# Patient Record
Sex: Female | Born: 1970 | Race: White | Hispanic: No | Marital: Married | Smoking: Never smoker
Health system: Southern US, Community
[De-identification: ages and names within clinical notes are randomized; demographics above are authoritative.]

## PROBLEM LIST (undated history)

## (undated) DIAGNOSIS — E611 Iron deficiency: Secondary | ICD-10-CM

## (undated) DIAGNOSIS — N83209 Unspecified ovarian cyst, unspecified side: Secondary | ICD-10-CM

## (undated) DIAGNOSIS — G43909 Migraine, unspecified, not intractable, without status migrainosus: Secondary | ICD-10-CM

## (undated) DIAGNOSIS — D649 Anemia, unspecified: Secondary | ICD-10-CM

## (undated) DIAGNOSIS — E162 Hypoglycemia, unspecified: Secondary | ICD-10-CM

## (undated) DIAGNOSIS — Z803 Family history of malignant neoplasm of breast: Secondary | ICD-10-CM

## (undated) HISTORY — DX: Migraine, unspecified, not intractable, without status migrainosus: G43.909

## (undated) HISTORY — DX: Anemia, unspecified: D64.9

## (undated) HISTORY — DX: Family history of malignant neoplasm of breast: Z80.3

## (undated) HISTORY — DX: Iron deficiency: E61.1

## (undated) HISTORY — DX: Unspecified ovarian cyst, unspecified side: N83.209

## (undated) HISTORY — DX: Hypoglycemia, unspecified: E16.2

---

## 1996-01-21 HISTORY — PX: BREAST CYST ASPIRATION: SHX578

## 2004-05-22 ENCOUNTER — Ambulatory Visit: Payer: Self-pay | Admitting: Internal Medicine

## 2005-07-24 ENCOUNTER — Ambulatory Visit: Payer: Self-pay | Admitting: Psychiatry

## 2006-02-09 ENCOUNTER — Ambulatory Visit: Payer: Self-pay | Admitting: Internal Medicine

## 2006-03-23 ENCOUNTER — Ambulatory Visit: Payer: Self-pay | Admitting: Unknown Physician Specialty

## 2006-04-15 ENCOUNTER — Ambulatory Visit: Payer: Self-pay | Admitting: Internal Medicine

## 2006-09-21 ENCOUNTER — Ambulatory Visit: Payer: Self-pay | Admitting: Internal Medicine

## 2007-05-25 ENCOUNTER — Ambulatory Visit: Payer: Self-pay | Admitting: Internal Medicine

## 2007-06-08 ENCOUNTER — Ambulatory Visit: Payer: Self-pay | Admitting: Internal Medicine

## 2009-12-31 ENCOUNTER — Ambulatory Visit: Payer: Self-pay | Admitting: Internal Medicine

## 2009-12-31 LAB — HM MAMMOGRAPHY

## 2010-03-30 ENCOUNTER — Ambulatory Visit: Payer: Self-pay | Admitting: Internal Medicine

## 2010-10-05 ENCOUNTER — Ambulatory Visit: Payer: Self-pay | Admitting: Internal Medicine

## 2010-10-09 LAB — HM COLONOSCOPY

## 2011-03-18 LAB — HM PAP SMEAR

## 2011-09-11 LAB — CBC AND DIFFERENTIAL
HCT: 37 % (ref 36–46)
Hemoglobin: 12.8 g/dL (ref 12.0–16.0)
Platelets: 235 10*3/uL (ref 150–399)
WBC: 3.7 10^3/mL

## 2012-02-11 ENCOUNTER — Encounter: Payer: Self-pay | Admitting: *Deleted

## 2012-02-12 ENCOUNTER — Other Ambulatory Visit (HOSPITAL_COMMUNITY)
Admission: RE | Admit: 2012-02-12 | Discharge: 2012-02-12 | Disposition: A | Discharge status: Home / Self Care | Payer: BC Managed Care – PPO | Source: Ambulatory Visit | Attending: Internal Medicine | Admitting: Internal Medicine

## 2012-02-12 ENCOUNTER — Encounter: Payer: Self-pay | Admitting: Internal Medicine

## 2012-02-12 ENCOUNTER — Ambulatory Visit (INDEPENDENT_AMBULATORY_CARE_PROVIDER_SITE_OTHER): Payer: BC Managed Care – PPO | Admitting: Internal Medicine

## 2012-02-12 VITALS — BP 116/64 | HR 72 | Temp 98.5°F | Ht 68.0 in | Wt 135.5 lb

## 2012-02-12 DIAGNOSIS — Z1151 Encounter for screening for human papillomavirus (HPV): Secondary | ICD-10-CM | POA: Insufficient documentation

## 2012-02-12 DIAGNOSIS — D649 Anemia, unspecified: Secondary | ICD-10-CM

## 2012-02-12 DIAGNOSIS — R5383 Other fatigue: Secondary | ICD-10-CM

## 2012-02-12 DIAGNOSIS — R5381 Other malaise: Secondary | ICD-10-CM

## 2012-02-12 DIAGNOSIS — D72819 Decreased white blood cell count, unspecified: Secondary | ICD-10-CM

## 2012-02-12 DIAGNOSIS — Z01419 Encounter for gynecological examination (general) (routine) without abnormal findings: Secondary | ICD-10-CM | POA: Insufficient documentation

## 2012-02-12 DIAGNOSIS — R350 Frequency of micturition: Secondary | ICD-10-CM

## 2012-02-12 DIAGNOSIS — Z139 Encounter for screening, unspecified: Secondary | ICD-10-CM

## 2012-02-12 LAB — POCT URINALYSIS DIPSTICK
Bilirubin, UA: NEGATIVE
Blood, UA: NEGATIVE
Glucose, UA: NEGATIVE
Ketones, UA: NEGATIVE
Leukocytes, UA: NEGATIVE
Nitrite, UA: NEGATIVE
Protein, UA: NEGATIVE
Spec Grav, UA: 1.015
Urobilinogen, UA: 0.2
pH, UA: 7

## 2012-02-13 LAB — CBC WITH DIFFERENTIAL/PLATELET
Basophils Absolute: 0 10*3/uL (ref 0.0–0.1)
Basophils Relative: 0.5 % (ref 0.0–3.0)
Eosinophils Absolute: 0.1 10*3/uL (ref 0.0–0.7)
Eosinophils Relative: 1.6 % (ref 0.0–5.0)
HCT: 36.6 % (ref 36.0–46.0)
Hemoglobin: 12.4 g/dL (ref 12.0–15.0)
Lymphocytes Relative: 22.4 % (ref 12.0–46.0)
Lymphs Abs: 0.9 10*3/uL (ref 0.7–4.0)
MCHC: 34 g/dL (ref 30.0–36.0)
MCV: 89 fl (ref 78.0–100.0)
Monocytes Absolute: 0.4 10*3/uL (ref 0.1–1.0)
Monocytes Relative: 9.9 % (ref 3.0–12.0)
Neutro Abs: 2.8 10*3/uL (ref 1.4–7.7)
Neutrophils Relative %: 65.6 % (ref 43.0–77.0)
Platelets: 220 10*3/uL (ref 150.0–400.0)
RBC: 4.11 Mil/uL (ref 3.87–5.11)
RDW: 13.6 % (ref 11.5–14.6)
WBC: 4.2 10*3/uL — ABNORMAL LOW (ref 4.5–10.5)

## 2012-02-13 LAB — COMPREHENSIVE METABOLIC PANEL
ALT: 21 U/L (ref 0–35)
AST: 24 U/L (ref 0–37)
Albumin: 4.1 g/dL (ref 3.5–5.2)
Alkaline Phosphatase: 49 U/L (ref 39–117)
BUN: 15 mg/dL (ref 6–23)
CO2: 28 mEq/L (ref 19–32)
Calcium: 9 mg/dL (ref 8.4–10.5)
Chloride: 101 mEq/L (ref 96–112)
Creatinine, Ser: 0.7 mg/dL (ref 0.4–1.2)
GFR: 92.74 mL/min (ref >60.00)
Glucose, Bld: 95 mg/dL (ref 70–99)
Potassium: 4.6 mEq/L (ref 3.5–5.1)
Sodium: 136 mEq/L (ref 135–145)
Total Bilirubin: 0.3 mg/dL (ref 0.3–1.2)
Total Protein: 7.1 g/dL (ref 6.0–8.3)

## 2012-02-13 LAB — TSH: TSH: 0.87 u[IU]/mL (ref 0.35–5.50)

## 2012-02-13 LAB — FERRITIN: Ferritin: 12.9 ng/mL (ref 10.0–291.0)

## 2012-02-15 ENCOUNTER — Encounter: Payer: Self-pay | Admitting: Internal Medicine

## 2012-02-15 DIAGNOSIS — D649 Anemia, unspecified: Secondary | ICD-10-CM | POA: Insufficient documentation

## 2012-02-15 DIAGNOSIS — D72819 Decreased white blood cell count, unspecified: Secondary | ICD-10-CM | POA: Insufficient documentation

## 2012-02-15 LAB — URINE CULTURE: Colony Count: 5000

## 2012-02-15 NOTE — Progress Notes (Signed)
Subjective:    Patient ID: Adrienne Phillips, female    DOB: [birth year], 42 y.o.   MRN: [id number]  HPI 42 year old with past history of migraine headaches, anemia and iron deficiency who comes in today to follow up on these issues as well as for a complete physical exam.  States she is doing well.  Handling stress relatively well.  Her and her husband are working through some issues.  He now has a job.  Has a lot of stress at work.  Had some issues with her left hip.  Had massage therapy.  Yoga and stretches help.  Felt a pop and her hip has not bothered her since.  Planning to continue yoga.  LMP 01/29/12.  Had some issues with a rectal tear.  Has been using some NTG cream.  Now taking a stool softener.  Better.  Has a history of seizures.  Has not had a seizure in years.   States she was at work and felt a funny sensation in her head.  She states it lasted approximately 10 seconds and resolve.  No confusion.  No post ictal state.  Has not had any further issues.  She could feel this episode coming on.  Exercising.  No cardiac symptoms with increased activity or exertion.  Eating and drinking well.  Past Medical History  Diagnosis Date  . Anemia   . Migraine   . Hypoglycemia   . Ovarian cyst   . Iron deficiency      Current Outpatient Prescriptions on File Prior to Visit  Medication Sig Dispense Refill  . Cholecalciferol (VITAMIN D3) 1000 UNITS CAPS Take by mouth daily. 5 capsules daily      . lansoprazole (PREVACID) 15 MG capsule Take 15 mg by mouth daily.      . metaxalone (SKELAXIN) 800 MG tablet Take 800 mg by mouth at bedtime. Take 1/2 to 1 tab at bedtime        Review of Systems Patient denies any headache, lightheadedness or dizziness.  No sinus or allergy symptoms. No chest pain, tightness or palpitations.  No increased shortness of breath, cough or congestion.  No nausea or vomiting.  No acid reflux.  No abdominal pain or cramping.  No bowel change, such as diarrhea,  BRBPR or melana.  Had  the tear as outlined.  Using a stool softener now.  No urine change.  Hip better.      Objective:   Physical Exam Filed Vitals:   02/12/12 1435  BP: 116/64  Pulse: 72  Temp: 98.5 F (72.70 C)   42 year old female in no acute distress.   HEENT:  Nares- clear.  Oropharynx - without lesions. NECK:  Supple.  Nontender.  No audible bruit.  HEART:  Appears to be regular. LUNGS:  No crackles or wheezing audible.  Respirations even and unlabored.  RADIAL PULSE:  Equal bilaterally.    BREASTS:  No nipple discharge or nipple retraction present.  Could not appreciate any distinct nodules or axillary adenopathy.  ABDOMEN:  Soft, nontender.  Bowel sounds present and normal.  No audible abdominal bruit.  GU:  Normal external genitalia.  Vaginal vault without lesions.  Cervix identified.  Pap performed. Could not appreciate any adnexal masses or tenderness.   EXTREMITIES:  No increased edema present.  DP pulses palpable and equal bilaterally.           Assessment & Plan:  HISTORY OF SEIZURE DISORDER.  See above.  Episode lasted 10 seconds.  No post ictal state or confusion.  Question of etiology.  Talked with her regarding further w/up.  She declines.  Will monitor closely.  States she can always tell when coming on.  Follow.  Will require further w/up if reoccurs.   RECTAL TEAR.  Improved with NTG cream.  Follow.    INCREASED PSYCHOSOCIAL STRESSORS.  Feels she is handling things relatively well.  Does not feel she needs any further intervention at this point.  Follow.  Planning to start yoga.    GYN.  Pelvic and pap today.    HISTORY OF HEADACHES.  Not an issue for her now.  Had a relatively recent MRI - negative.  Follow.   CARDIOVASCULAR.  Asymptomatic.    HEALTH MAINTENANCE.  Physical today.  Colonoscopy 09/29/10 - two polyps.  Recommended follow up 2017.  Schedule mammogram.

## 2012-02-15 NOTE — Assessment & Plan Note (Signed)
Worked up by hematology.  Recheck cbc.  

## 2012-02-15 NOTE — Assessment & Plan Note (Signed)
GI w/up as outlined.  Recheck cbc and ferritin.

## 2012-02-21 ENCOUNTER — Telehealth: Payer: Self-pay | Admitting: Internal Medicine

## 2012-02-21 NOTE — Telephone Encounter (Signed)
Pt notified of lab results via mychart. 

## 2012-02-25 ENCOUNTER — Telehealth: Payer: Self-pay | Admitting: Internal Medicine

## 2012-02-25 NOTE — Telephone Encounter (Signed)
Pt is stating mammo should be diagnostic instead of screening.  What do I need to do, just change the order.  I am not sure why is diagnostic.

## 2012-02-25 NOTE — Telephone Encounter (Signed)
Pt has mammo coming up but it should be diagnostic instead of Screening.

## 2012-04-02 ENCOUNTER — Encounter: Payer: Self-pay | Admitting: Internal Medicine

## 2012-04-02 DIAGNOSIS — K2270 Barrett's esophagus without dysplasia: Secondary | ICD-10-CM

## 2012-04-14 ENCOUNTER — Telehealth: Payer: Self-pay | Admitting: Internal Medicine

## 2012-04-14 NOTE — Telephone Encounter (Signed)
Patient agreed to f/u with Dr. Lorin Picket. Appointment scheduled.

## 2012-04-14 NOTE — Telephone Encounter (Signed)
Notify pt that she would need to go through physical therapy for a brace like this - to be fitted.  If increasing problems, may need to see her again and reevaluate.  If agreeable, can schedule her a follow up appt with me

## 2012-04-14 NOTE — Telephone Encounter (Signed)
Pt is wanting to know if you would write an rx for a neck brace/harness. She is having a lot of trouble with her neck and is needing something to help stretch her neck. She described it as what "Barney wore in the [employer] show" (Pt told me to put that she said you would know exactly what it was.

## 2012-04-29 ENCOUNTER — Encounter: Payer: Self-pay | Admitting: Internal Medicine

## 2012-05-19 ENCOUNTER — Ambulatory Visit: Payer: BC Managed Care – PPO | Admitting: Internal Medicine

## 2012-07-22 ENCOUNTER — Other Ambulatory Visit: Payer: Self-pay | Admitting: Internal Medicine

## 2012-07-22 NOTE — Telephone Encounter (Signed)
Prescription sent for skelaxin 800mg  #20  With no refills.

## 2012-08-12 ENCOUNTER — Encounter: Payer: Self-pay | Admitting: Internal Medicine

## 2012-08-12 ENCOUNTER — Ambulatory Visit (INDEPENDENT_AMBULATORY_CARE_PROVIDER_SITE_OTHER): Payer: BC Managed Care – PPO | Admitting: Internal Medicine

## 2012-08-12 VITALS — BP 110/70 | HR 60 | Temp 98.7°F | Ht 68.0 in | Wt 138.2 lb

## 2012-08-12 DIAGNOSIS — D72819 Decreased white blood cell count, unspecified: Secondary | ICD-10-CM

## 2012-08-12 DIAGNOSIS — D649 Anemia, unspecified: Secondary | ICD-10-CM

## 2012-08-15 ENCOUNTER — Encounter: Payer: Self-pay | Admitting: Internal Medicine

## 2012-08-15 NOTE — Progress Notes (Signed)
Subjective:    Patient ID: Adrienne Phillips, female    DOB: 06/09/1970, 42 y.o.   MRN: [id number]  HPI 42 year old with past history of migraine headaches, anemia and iron deficiency who comes in today for a scheduled follow up.  States she is doing relatively well.  Handling stress relatively well.  Her and her husband have tried working through some issues.  He still has a job.  We discussed counseling.  May be agreeable to this in the future.  Is going out.  Still exercising.  Has a lot of stress at work.  Yoga and stretches help.  Exercising.  No cardiac symptoms with increased activity or exertion.  Eating and drinking well.  Still having issues with her left hip and leg.  Left leg will feel numb when she is working out.  Resolves after she stops exercising.  She did see [facility].  Wearing support stockings.  Helping some.  Planning to have vein intervention in 9/14.     Past Medical History  Diagnosis Date  . Anemia   . Migraine   . Hypoglycemia   . Ovarian cyst   . Iron deficiency      Current Outpatient Prescriptions on File Prior to Visit  Medication Sig Dispense Refill  . Lactobacillus (ACIDOPHILUS PO) Take by mouth daily.      . metaxalone (SKELAXIN) 800 MG tablet TAKE 1/2 TO 1 TABLET BY MOUTH AT BEDTIME AS NEEDED FOR BACK SPASMS  20 tablet  0   No current facility-administered medications on file prior to visit.    Review of Systems Patient denies any headache, lightheadedness or dizziness.  No sinus or allergy symptoms. No chest pain, tightness or palpitations.  No increased shortness of breath, cough or congestion.  No nausea or vomiting.  No acid reflux.  No abdominal pain or cramping.  No bowel change, such as diarrhea,  BRBPR or melana.  Left hip and leg discomfort as outlined.       Objective:   Physical Exam  Filed Vitals:   08/12/12 1553  BP: 110/70  Pulse: 60  Temp: 98.7 F (1.41 C)   42 year old female in no acute distress.   HEENT:  Nares-  clear.  Oropharynx - without lesions. NECK:  Supple.  Nontender.  No audible bruit.  HEART:  Appears to be regular. LUNGS:  No crackles or wheezing audible.  Respirations even and unlabored.  RADIAL PULSE:  Equal bilaterally.  ABDOMEN:  Soft, nontender.  Bowel sounds present and normal.  No audible abdominal bruit.   EXTREMITIES:  No increased edema present.  DP pulses palpable and equal bilaterally.      MSK:  Some pulling in her left hip with straight leg raise.  No motor weakness noted.       Assessment & Plan:  INCREASED PSYCHOSOCIAL STRESSORS.  Feels she is handling things relatively well.  Discussed counseling.  May be agreeable in the future.    MSK.  Left hip and leg pain and numbness as outlined.  Stretches.  Planning for vein intervention in 9/14.  Wants to hold on further w/up at this time.  Skelaxin prn.  Follow.   HISTORY OF HEADACHES.  Not an issue for her now.  Had a relatively recent MRI - negative.  Follow.   CARDIOVASCULAR.  Asymptomatic.    HEALTH MAINTENANCE.  Physical last visit.  Pap negative with negative HPV.  Colonoscopy 09/29/10 - two polyps.  Recommended follow up 2017.  Mammogram 04/01/12 - Birads II.

## 2012-08-15 NOTE — Assessment & Plan Note (Signed)
Hemoglobin checked in 1/14 - normal.  Follow.

## 2012-08-15 NOTE — Assessment & Plan Note (Signed)
Worked up by hematology.  Last wbc - stable.    

## 2012-09-01 ENCOUNTER — Telehealth: Payer: Self-pay | Admitting: Internal Medicine

## 2012-09-01 NOTE — Telephone Encounter (Signed)
Counselor information given.

## 2012-09-03 ENCOUNTER — Encounter: Payer: Self-pay | Admitting: Internal Medicine

## 2012-10-05 ENCOUNTER — Encounter: Payer: Self-pay | Admitting: Internal Medicine

## 2012-10-05 ENCOUNTER — Ambulatory Visit (INDEPENDENT_AMBULATORY_CARE_PROVIDER_SITE_OTHER): Payer: BC Managed Care – PPO | Admitting: Internal Medicine

## 2012-10-05 VITALS — BP 110/70 | HR 63 | Temp 98.0°F | Ht 68.0 in | Wt 136.5 lb

## 2012-10-05 DIAGNOSIS — R109 Unspecified abdominal pain: Secondary | ICD-10-CM

## 2012-10-05 DIAGNOSIS — D649 Anemia, unspecified: Secondary | ICD-10-CM

## 2012-10-05 LAB — CBC WITH DIFFERENTIAL/PLATELET
Basophils Absolute: 0.1 10*3/uL (ref 0.0–0.1)
Basophils Relative: 1.1 % (ref 0.0–3.0)
Eosinophils Absolute: 0.1 10*3/uL (ref 0.0–0.7)
Eosinophils Relative: 1.2 % (ref 0.0–5.0)
HCT: 36 % (ref 36.0–46.0)
Hemoglobin: 11.9 g/dL — ABNORMAL LOW (ref 12.0–15.0)
Lymphocytes Relative: 23.5 % (ref 12.0–46.0)
Lymphs Abs: 1.1 10*3/uL (ref 0.7–4.0)
MCHC: 33.2 g/dL (ref 30.0–36.0)
MCV: 88.6 fl (ref 78.0–100.0)
Monocytes Absolute: 0.5 10*3/uL (ref 0.1–1.0)
Monocytes Relative: 9.8 % (ref 3.0–12.0)
Neutro Abs: 3 10*3/uL (ref 1.4–7.7)
Neutrophils Relative %: 64.4 % (ref 43.0–77.0)
Platelets: 221 10*3/uL (ref 150.0–400.0)
RBC: 4.07 Mil/uL (ref 3.87–5.11)
RDW: 14.7 % — ABNORMAL HIGH (ref 11.5–14.6)
WBC: 4.7 10*3/uL (ref 4.5–10.5)

## 2012-10-05 LAB — HEPATIC FUNCTION PANEL
ALT: 22 U/L (ref 0–35)
AST: 20 U/L (ref 0–37)
Albumin: 4.1 g/dL (ref 3.5–5.2)
Alkaline Phosphatase: 52 U/L (ref 39–117)
Bilirubin, Direct: 0 mg/dL (ref 0.0–0.3)
Total Bilirubin: 0.4 mg/dL (ref 0.3–1.2)
Total Protein: 6.8 g/dL (ref 6.0–8.3)

## 2012-10-05 LAB — BASIC METABOLIC PANEL
BUN: 11 mg/dL (ref 6–23)
CO2: 28 mEq/L (ref 19–32)
Calcium: 8.8 mg/dL (ref 8.4–10.5)
Chloride: 104 mEq/L (ref 96–112)
Creatinine, Ser: 0.7 mg/dL (ref 0.4–1.2)
GFR: 102.07 mL/min (ref >60.00)
Glucose, Bld: 81 mg/dL (ref 70–99)
Potassium: 4.6 mEq/L (ref 3.5–5.1)
Sodium: 137 mEq/L (ref 135–145)

## 2012-10-05 LAB — LIPASE: Lipase: 19 U/L (ref 11.0–59.0)

## 2012-10-05 LAB — AMYLASE: Amylase: 37 U/L (ref 27–131)

## 2012-10-06 ENCOUNTER — Encounter: Payer: Self-pay | Admitting: Internal Medicine

## 2012-10-06 ENCOUNTER — Ambulatory Visit: Payer: Self-pay | Admitting: Internal Medicine

## 2012-10-07 ENCOUNTER — Telehealth: Payer: Self-pay | Admitting: Internal Medicine

## 2012-10-07 ENCOUNTER — Encounter: Payer: Self-pay | Admitting: Internal Medicine

## 2012-10-07 NOTE — Telephone Encounter (Signed)
Opened in error

## 2012-10-07 NOTE — Progress Notes (Signed)
Subjective:    Patient ID: Adrienne Phillips, female    DOB: [birth year], 42 y.o.   MRN: [id number]  Abdominal Pain Associated symptoms include diarrhea.  Diarrhea  Associated symptoms include abdominal pain.  42 year old with past history of migraine headaches, anemia and iron deficiency who comes in today as a work in with concerns regarding increased epigastric pain.  States over the last week she has noticed persistent pain and decreased po intake.  Symptoms started one week ago.  Went to eat at a friends house.  Starting feeling a burning sensation in her stomach ("on fire").  Felt shaky in her stomach.  Emesis x 4 initially.  Through that night, had increased bilious vomiting.  States was vomiting every 15 minutes.  Took a phenergan suppository and vomiting stopped.  No further vomiting. Is having throat and epigastric pain.  Unable to eat.  States every time she eats, burning/discomfort.  Also watery stool, immediately after eating.  No blood.  No documented fever.    Past Medical History  Diagnosis Date  . Anemia   . Migraine   . Hypoglycemia   . Ovarian cyst   . Iron deficiency      Current Outpatient Prescriptions on File Prior to Visit  Medication Sig Dispense Refill  . ALPRAZolam (XANAX) 0.25 MG tablet Take 0.25 mg by mouth at bedtime as needed for sleep.      Tery Sanfilippo Sodium (COLACE PO) Take by mouth.      . famotidine (PEPCID) 10 MG tablet Take 10 mg by mouth daily.      . Lactobacillus (ACIDOPHILUS PO) Take by mouth daily.      . metaxalone (SKELAXIN) 800 MG tablet TAKE 1/2 TO 1 TABLET BY MOUTH AT BEDTIME AS NEEDED FOR BACK SPASMS  20 tablet  0  . Triamcinolone Acetonide (NASACORT AQ NA) Place into the nose.       No current facility-administered medications on file prior to visit.    Review of Systems  Gastrointestinal: Positive for abdominal pain and diarrhea.  Patient denies any headache, lightheadedness or dizziness.  No sinus or allergy symptoms. No chest pain,  tightness or palpitations.  No increased shortness of breath, cough or congestion.  Epigastric pain and discomfort as outlined.  Discomfort after eating.  Loose stool after eating as outlined.  No blood.        Objective:   Physical Exam  Filed Vitals:   10/05/12 1352  BP: 110/70  Pulse: 63  Temp: 98 F (67.19 C)   42 year old female in no acute distress.  NECK:  Supple.  Nontender.  HEART:  Appears to be regular. LUNGS:  No crackles or wheezing audible.  Respirations even and unlabored.  RADIAL PULSE:  Equal bilaterally.  ABDOMEN:  Soft.  Minimal tenderness to palpation over the epigastric region and upper quadrant.  Bowel sounds present and normal.  No audible abdominal bruit.   EXTREMITIES:  No increased edema present.      Assessment & Plan:  ABDOMINAL PAIN.  Persistent.  Symptoms and exam as outlined.  Persistent loose stool after eating. She is unable to take PPIs.  Will start her pepcid as directed.  Maalox as directed.  Bland foods.  Stay hydrated.  Check liver panel, amylase, lipase and cbc.  Check abdominal ultrasound.  Further w/up pending results.  Pt comfortable with this plan.    INCREASED PSYCHOSOCIAL STRESSORS.  Feels she is handling things relatively well.    CARDIOVASCULAR.  Asymptomatic.  HEALTH MAINTENANCE.  Physical 02/12/12.   Pap negative with negative HPV.  Colonoscopy 09/29/10 - two polyps.  Recommended follow up 2017.  Mammogram 04/01/12 - Birads II.   I spent 25 minutes with the patient and more than 50% of the time was spent in consultation regarding the above.

## 2012-10-08 ENCOUNTER — Encounter: Payer: Self-pay | Admitting: Internal Medicine

## 2012-10-08 NOTE — Telephone Encounter (Signed)
Called pt 10/07/12.  Discussed ultrasound results with her.  Discussed further w/up including HIDA scan and referral to surgery.  She also informed my that she was due f/u with Dr Markham Jordan for repeat EGD.  She has completed her form and is waiting for them to schedule.  She plans to call them 10/08/12 to follow up regarding scheduling.  She is feeling better.  Will continue her current regimen.  Leaving to go to [place] in two days.  Will call me back when she returns.

## 2012-10-08 NOTE — Assessment & Plan Note (Signed)
Hemoglobin checked in 1/14 - normal.  Follow.  

## 2012-10-21 ENCOUNTER — Encounter: Payer: Self-pay | Admitting: Internal Medicine

## 2012-11-25 ENCOUNTER — Other Ambulatory Visit: Payer: Self-pay

## 2013-01-12 ENCOUNTER — Ambulatory Visit: Payer: Self-pay | Admitting: Physician Assistant

## 2013-01-12 LAB — RAPID INFLUENZA A&B ANTIGENS

## 2013-02-18 ENCOUNTER — Encounter: Payer: Self-pay | Admitting: Internal Medicine

## 2013-02-18 ENCOUNTER — Telehealth: Payer: Self-pay | Admitting: Internal Medicine

## 2013-02-18 ENCOUNTER — Ambulatory Visit (INDEPENDENT_AMBULATORY_CARE_PROVIDER_SITE_OTHER): Payer: BC Managed Care – PPO | Admitting: Internal Medicine

## 2013-02-18 VITALS — BP 110/70 | HR 80 | Temp 98.4°F | Ht 67.0 in | Wt 140.2 lb

## 2013-02-18 DIAGNOSIS — D649 Anemia, unspecified: Secondary | ICD-10-CM

## 2013-02-18 DIAGNOSIS — Z1239 Encounter for other screening for malignant neoplasm of breast: Secondary | ICD-10-CM

## 2013-02-18 DIAGNOSIS — D72819 Decreased white blood cell count, unspecified: Secondary | ICD-10-CM

## 2013-02-18 DIAGNOSIS — Z13220 Encounter for screening for lipoid disorders: Secondary | ICD-10-CM

## 2013-02-18 NOTE — Progress Notes (Signed)
Pre-visit discussion using our clinic review tool. No additional management support is needed unless otherwise documented below in the visit note.  

## 2013-02-18 NOTE — Telephone Encounter (Signed)
At check out pt states she is having her labs done in [place].  States she is supposed to get a script for those.  States she will swing back by shortly to pick that up.

## 2013-02-18 NOTE — Telephone Encounter (Signed)
See below

## 2013-02-18 NOTE — Telephone Encounter (Signed)
rx written and placed up front.

## 2013-02-20 ENCOUNTER — Encounter: Payer: Self-pay | Admitting: Internal Medicine

## 2013-02-20 NOTE — Assessment & Plan Note (Addendum)
Hemoglobin checked in 10/05/12 - 11.9.   Follow.  Recheck cbc.

## 2013-02-20 NOTE — Progress Notes (Signed)
Subjective:    Patient ID: Adrienne GreenlandLisa Purifoy, female    DOB: [birth year], 43 y.o.   MRN: [id number]  HPI 43 year old with past history of migraine headaches, anemia and iron deficiency who comes in today to follow up on these issues as well as for a complete physical exam.   States she is doing relatively well.  Handling stress well.  Her and her husband has worked through some of their issues.  Back together.  Still exercising.  Has a lot of stress at work.  Yoga and stretches help.  Exercising.  No cardiac symptoms with increased activity or exertion.  Eating and drinking well.   She did see [facility].  Wearing support stockings.  Had vascular intervention.  Doing well.  Has f/u regarding her left leg next week.  The aching has improved.  Neck/shoulder/arm - better.  Takes 1/2 skelaxin as needed.  Overall feels good.      Past Medical History  Diagnosis Date  . Anemia   . Migraine   . Hypoglycemia   . Ovarian cyst   . Iron deficiency      Current Outpatient Prescriptions on File Prior to Visit  Medication Sig Dispense Refill  . ALPRAZolam (XANAX) 0.25 MG tablet Take 0.25 mg by mouth at bedtime as needed for sleep.      Tery Sanfilippo. Docusate Sodium (COLACE PO) Take by mouth.      . famotidine (PEPCID) 10 MG tablet Take 10 mg by mouth daily.      . Lactobacillus (ACIDOPHILUS PO) Take by mouth daily.      . metaxalone (SKELAXIN) 800 MG tablet TAKE 1/2 TO 1 TABLET BY MOUTH AT BEDTIME AS NEEDED FOR BACK SPASMS  20 tablet  0   No current facility-administered medications on file prior to visit.    Review of Systems Patient denies any headache, lightheadedness or dizziness.  No sinus or allergy symptoms.  No chest pain, tightness or palpitations.  No increased shortness of breath, cough or congestion.  No nausea or vomiting.  No acid reflux.  No abdominal pain or cramping.  No bowel change, such as diarrhea,  BRBPR or melana.  Is s/p vascular intervention on her legs.  Legs are better.  Aching  better.  Handling stress well.       Objective:   Physical Exam  Filed Vitals:   02/18/13 1048  BP: 110/70  Pulse: 80  Temp: 98.4 F (5636.519 C)   43 year old female in no acute distress.   HEENT:  Nares- clear.  Oropharynx - without lesions. NECK:  Supple.  Nontender.  No audible bruit.  HEART:  Appears to be regular. LUNGS:  No crackles or wheezing audible.  Respirations even and unlabored.  RADIAL PULSE:  Equal bilaterally.    BREASTS:  No nipple discharge or nipple retraction present.  Could not appreciate any distinct nodules or axillary adenopathy.  ABDOMEN:  Soft, nontender.  Bowel sounds present and normal.  No audible abdominal bruit.  GU:  Not performed.    EXTREMITIES:  No increased edema present.  DP pulses palpable and equal bilaterally.          Assessment & Plan:  INCREASED PSYCHOSOCIAL STRESSORS.  Feels she is handling things relatively well.  Relationship better.  Follow.   MSK.  Takes skelaxin prn.  Stable.    HISTORY OF HEADACHES.  Not an issue for her now.  Had a relatively recent MRI - negative.  Follow.  CARDIOVASCULAR.  Asymptomatic.    HEALTH MAINTENANCE.  Physical today.   Pap negative with negative HPV.  Colonoscopy 09/29/10 - two polyps.  Recommended follow up 2017.  Mammogram 04/01/12 - Birads II.  Schedule f/u mammogram when due.

## 2013-02-20 NOTE — Assessment & Plan Note (Signed)
Worked up by hematology.  Last wbc - stable.

## 2013-02-24 ENCOUNTER — Encounter: Payer: Self-pay | Admitting: Emergency Medicine

## 2013-03-20 ENCOUNTER — Encounter: Payer: Self-pay | Admitting: Internal Medicine

## 2013-03-20 DIAGNOSIS — K2270 Barrett's esophagus without dysplasia: Secondary | ICD-10-CM

## 2013-03-31 ENCOUNTER — Ambulatory Visit: Payer: Self-pay | Admitting: Internal Medicine

## 2013-03-31 LAB — HM MAMMOGRAPHY: HM Mammogram: NEGATIVE

## 2013-04-05 ENCOUNTER — Encounter: Payer: Self-pay | Admitting: Internal Medicine

## 2013-04-28 ENCOUNTER — Encounter: Payer: Self-pay | Admitting: Internal Medicine

## 2013-05-03 ENCOUNTER — Encounter: Payer: Self-pay | Admitting: Internal Medicine

## 2013-05-20 ENCOUNTER — Other Ambulatory Visit (INDEPENDENT_AMBULATORY_CARE_PROVIDER_SITE_OTHER): Payer: BC Managed Care – PPO

## 2013-05-20 DIAGNOSIS — D649 Anemia, unspecified: Secondary | ICD-10-CM

## 2013-05-20 DIAGNOSIS — D72819 Decreased white blood cell count, unspecified: Secondary | ICD-10-CM

## 2013-05-20 DIAGNOSIS — Z13220 Encounter for screening for lipoid disorders: Secondary | ICD-10-CM

## 2013-05-20 LAB — CBC WITH DIFFERENTIAL/PLATELET
Basophils Absolute: 0 10*3/uL (ref 0.0–0.1)
Basophils Relative: 1.4 % (ref 0.0–3.0)
Eosinophils Absolute: 0.1 10*3/uL (ref 0.0–0.7)
Eosinophils Relative: 2 % (ref 0.0–5.0)
HCT: 34.5 % — ABNORMAL LOW (ref 36.0–46.0)
Hemoglobin: 11.6 g/dL — ABNORMAL LOW (ref 12.0–15.0)
Lymphocytes Relative: 27.6 % (ref 12.0–46.0)
Lymphs Abs: 0.7 10*3/uL (ref 0.7–4.0)
MCHC: 33.6 g/dL (ref 30.0–36.0)
MCV: 88.5 fl (ref 78.0–100.0)
Monocytes Absolute: 0.4 10*3/uL (ref 0.1–1.0)
Monocytes Relative: 13.7 % — ABNORMAL HIGH (ref 3.0–12.0)
Neutro Abs: 1.4 10*3/uL (ref 1.4–7.7)
Neutrophils Relative %: 55.3 % (ref 43.0–77.0)
Platelets: 200 10*3/uL (ref 150.0–400.0)
RBC: 3.89 Mil/uL (ref 3.87–5.11)
RDW: 14.1 % (ref 11.5–14.6)
WBC: 2.6 10*3/uL — ABNORMAL LOW (ref 4.5–10.5)

## 2013-05-20 LAB — COMPREHENSIVE METABOLIC PANEL
ALT: 20 U/L (ref 0–35)
AST: 21 U/L (ref 0–37)
Albumin: 3.8 g/dL (ref 3.5–5.2)
Alkaline Phosphatase: 43 U/L (ref 39–117)
BUN: 11 mg/dL (ref 6–23)
CO2: 27 mEq/L (ref 19–32)
Calcium: 8.8 mg/dL (ref 8.4–10.5)
Chloride: 102 mEq/L (ref 96–112)
Creatinine, Ser: 0.7 mg/dL (ref 0.4–1.2)
GFR: 93.66 mL/min (ref >60.00)
Glucose, Bld: 84 mg/dL (ref 70–99)
Potassium: 4.5 mEq/L (ref 3.5–5.1)
Sodium: 135 mEq/L (ref 135–145)
Total Bilirubin: 0.5 mg/dL (ref 0.3–1.2)
Total Protein: 6.6 g/dL (ref 6.0–8.3)

## 2013-05-20 LAB — LIPID PANEL
Cholesterol: 177 mg/dL (ref 0–200)
HDL: 97.4 mg/dL (ref >39.00)
LDL Cholesterol: 77 mg/dL (ref 0–99)
Total CHOL/HDL Ratio: 2
Triglycerides: 14 mg/dL (ref 0.0–149.0)
VLDL: 2.8 mg/dL (ref 0.0–40.0)

## 2013-05-20 LAB — TSH: TSH: 1.07 u[IU]/mL (ref 0.35–5.50)

## 2013-05-20 LAB — FERRITIN: Ferritin: 7.5 ng/mL — ABNORMAL LOW (ref 10.0–291.0)

## 2013-05-25 ENCOUNTER — Encounter: Payer: Self-pay | Admitting: Internal Medicine

## 2013-05-25 ENCOUNTER — Other Ambulatory Visit: Payer: Self-pay | Admitting: Internal Medicine

## 2013-05-25 ENCOUNTER — Telehealth: Payer: Self-pay | Admitting: Internal Medicine

## 2013-05-25 DIAGNOSIS — D72819 Decreased white blood cell count, unspecified: Secondary | ICD-10-CM

## 2013-05-25 NOTE — Telephone Encounter (Signed)
Pt notified of lab results via my chart.  Needs a non fasting lab appt in 2 weeks.  Please contact her with lab appt date and time.  Thanks.   Dr Lorin PicketScott

## 2013-05-25 NOTE — Telephone Encounter (Signed)
Refill

## 2013-05-25 NOTE — Telephone Encounter (Signed)
Refilled skelaxin #20 with no refills.

## 2013-05-27 NOTE — Telephone Encounter (Signed)
Appointment 5/22 sent my chart message letting pt know about appointmetn date and time

## 2013-06-10 ENCOUNTER — Other Ambulatory Visit: Payer: BC Managed Care – PPO

## 2013-06-10 ENCOUNTER — Other Ambulatory Visit (INDEPENDENT_AMBULATORY_CARE_PROVIDER_SITE_OTHER): Payer: BC Managed Care – PPO

## 2013-06-10 DIAGNOSIS — D72819 Decreased white blood cell count, unspecified: Secondary | ICD-10-CM

## 2013-06-10 LAB — CBC WITH DIFFERENTIAL/PLATELET
Basophils Absolute: 0 10*3/uL (ref 0.0–0.1)
Basophils Relative: 0.9 % (ref 0.0–3.0)
Eosinophils Absolute: 0.1 10*3/uL (ref 0.0–0.7)
Eosinophils Relative: 1.6 % (ref 0.0–5.0)
HCT: 34.1 % — ABNORMAL LOW (ref 36.0–46.0)
Hemoglobin: 11.5 g/dL — ABNORMAL LOW (ref 12.0–15.0)
Lymphocytes Relative: 13.4 % (ref 12.0–46.0)
Lymphs Abs: 0.5 10*3/uL — ABNORMAL LOW (ref 0.7–4.0)
MCHC: 33.7 g/dL (ref 30.0–36.0)
MCV: 87.6 fl (ref 78.0–100.0)
Monocytes Absolute: 0.4 10*3/uL (ref 0.1–1.0)
Monocytes Relative: 11.2 % (ref 3.0–12.0)
Neutro Abs: 2.8 10*3/uL (ref 1.4–7.7)
Neutrophils Relative %: 72.9 % (ref 43.0–77.0)
Platelets: 197 10*3/uL (ref 150.0–400.0)
RBC: 3.89 Mil/uL (ref 3.87–5.11)
RDW: 14.4 % (ref 11.5–15.5)
WBC: 3.8 10*3/uL — ABNORMAL LOW (ref 4.0–10.5)

## 2013-06-14 ENCOUNTER — Encounter: Payer: Self-pay | Admitting: *Deleted

## 2013-06-14 ENCOUNTER — Encounter: Payer: Self-pay | Admitting: Internal Medicine

## 2013-06-14 ENCOUNTER — Other Ambulatory Visit: Payer: Self-pay | Admitting: Internal Medicine

## 2013-06-14 DIAGNOSIS — D72819 Decreased white blood cell count, unspecified: Secondary | ICD-10-CM

## 2013-06-14 NOTE — Progress Notes (Signed)
Order placed for f/u cbc.   

## 2013-06-15 ENCOUNTER — Encounter: Payer: Self-pay | Admitting: *Deleted

## 2013-07-12 ENCOUNTER — Other Ambulatory Visit: Payer: BC Managed Care – PPO

## 2013-07-28 ENCOUNTER — Other Ambulatory Visit: Payer: BC Managed Care – PPO

## 2013-08-04 ENCOUNTER — Other Ambulatory Visit (INDEPENDENT_AMBULATORY_CARE_PROVIDER_SITE_OTHER): Payer: BC Managed Care – PPO

## 2013-08-04 DIAGNOSIS — D72819 Decreased white blood cell count, unspecified: Secondary | ICD-10-CM

## 2013-08-04 LAB — CBC WITH DIFFERENTIAL/PLATELET
Basophils Absolute: 0 10*3/uL (ref 0.0–0.1)
Basophils Relative: 0.6 % (ref 0.0–3.0)
Eosinophils Absolute: 0 10*3/uL (ref 0.0–0.7)
Eosinophils Relative: 1.4 % (ref 0.0–5.0)
HCT: 36.3 % (ref 36.0–46.0)
Hemoglobin: 12 g/dL (ref 12.0–15.0)
Lymphocytes Relative: 23 % (ref 12.0–46.0)
Lymphs Abs: 0.8 10*3/uL (ref 0.7–4.0)
MCHC: 33.1 g/dL (ref 30.0–36.0)
MCV: 90 fl (ref 78.0–100.0)
Monocytes Absolute: 0.4 10*3/uL (ref 0.1–1.0)
Monocytes Relative: 10.5 % (ref 3.0–12.0)
Neutro Abs: 2.2 10*3/uL (ref 1.4–7.7)
Neutrophils Relative %: 64.5 % (ref 43.0–77.0)
Platelets: 238 10*3/uL (ref 150.0–400.0)
RBC: 4.03 Mil/uL (ref 3.87–5.11)
RDW: 16.1 % — ABNORMAL HIGH (ref 11.5–15.5)
WBC: 3.5 10*3/uL — ABNORMAL LOW (ref 4.0–10.5)

## 2013-08-05 ENCOUNTER — Encounter: Payer: Self-pay | Admitting: Internal Medicine

## 2013-08-19 ENCOUNTER — Ambulatory Visit: Payer: BC Managed Care – PPO | Admitting: Internal Medicine

## 2013-10-14 ENCOUNTER — Encounter: Payer: Self-pay | Admitting: Internal Medicine

## 2013-10-14 ENCOUNTER — Ambulatory Visit (INDEPENDENT_AMBULATORY_CARE_PROVIDER_SITE_OTHER): Payer: BC Managed Care – PPO | Admitting: Internal Medicine

## 2013-10-14 VITALS — BP 102/68 | HR 77 | Temp 98.3°F | Resp 14 | Ht 67.0 in | Wt 139.8 lb

## 2013-10-14 DIAGNOSIS — D649 Anemia, unspecified: Secondary | ICD-10-CM

## 2013-10-14 DIAGNOSIS — Z23 Encounter for immunization: Secondary | ICD-10-CM

## 2013-10-14 DIAGNOSIS — D72819 Decreased white blood cell count, unspecified: Secondary | ICD-10-CM

## 2013-10-14 DIAGNOSIS — K2270 Barrett's esophagus without dysplasia: Secondary | ICD-10-CM

## 2013-10-14 MED ORDER — ALPRAZOLAM 0.25 MG PO TABS: 0.2500 mg | ORAL_TABLET | Freq: Every evening | ORAL | Status: DC | PRN

## 2013-10-14 NOTE — Progress Notes (Signed)
Pre visit review using our clinic review tool, if applicable. No additional management support is needed unless otherwise documented below in the visit note. 

## 2013-10-16 ENCOUNTER — Encounter: Payer: Self-pay | Admitting: Internal Medicine

## 2013-10-16 NOTE — Assessment & Plan Note (Signed)
EGD 01/05/13 - results as outlined.  Path negative for Barretts.  Currently asymptomatic.  Recommended f/u EGD 12/2017.

## 2013-10-16 NOTE — Progress Notes (Signed)
  Subjective:    Patient ID: Adrienne Phillips, female    DOB: 1970/06/09, 43 y.o.   MRN: [id number]  HPI 43 year old with past history of migraine headaches, anemia and iron deficiency who comes in today for a scheduled follow up.   States she is doing relatively well.  Handling stress well.  Her and her husband have worked through their issues.  Back together.  Planning to renew their wedding vows.  Still exercising.  Has a lot of stress at work.  Yoga and stretches help.  Exercising.  No cardiac symptoms with increased activity or exertion.  Eating and drinking well.   She did see [facility].  Wearing support stockings.  Had vascular intervention.  Doing well.  No problems reported today.  Neck/shoulder/arm - better.  Overall feels good.      Past Medical History  Diagnosis Date  . Anemia   . Migraine   . Hypoglycemia   . Ovarian cyst   . Iron deficiency      Current Outpatient Prescriptions on File Prior to Visit  Medication Sig Dispense Refill  . Docusate Sodium (COLACE PO) Take by mouth.      . famotidine (PEPCID) 10 MG tablet Take 10 mg by mouth daily.      . Lactobacillus (ACIDOPHILUS PO) Take by mouth daily.      . metaxalone (SKELAXIN) 800 MG tablet TAKE 1/2 TO 1 TABLET BY MOUTH AT BEDTIME AS NEEDED FOR BACK SPASMS  20 tablet  0   No current facility-administered medications on file prior to visit.    Review of Systems Patient denies any headache, lightheadedness or dizziness.  No sinus or allergy symptoms.  No chest pain, tightness or palpitations.  No increased shortness of breath, cough or congestion.  No nausea or vomiting.  No acid reflux.  No abdominal pain or cramping.  No bowel change, such as diarrhea,  BRBPR or melana.  Is s/p vascular intervention on her legs.   Handling stress well.  Overall she feels good and feels she is doing well.       Objective:   Physical Exam  Filed Vitals:   10/14/13 1332  BP: 102/68  Pulse: 77  Temp: 98.3 F (36.8 C)   Resp: 57   43 year old female in no acute distress.   HEENT:  Nares- clear.  Oropharynx - without lesions. NECK:  Supple.  Nontender.  No audible bruit.  HEART:  Appears to be regular. LUNGS:  No crackles or wheezing audible.  Respirations even and unlabored.  RADIAL PULSE:  Equal bilaterally.  ABDOMEN:  Soft, nontender.  Bowel sounds present and normal.  No audible abdominal bruit.   EXTREMITIES:  No increased edema present.  DP pulses palpable and equal bilaterally.          Assessment & Plan:  INCREASED PSYCHOSOCIAL STRESSORS.  Feels she is handling things relatively well.  Relationship better.  Follow.   MSK.  Takes skelaxin prn.  Stable.    HISTORY OF HEADACHES.  Not an issue for her now.  Had a relatively recent MRI - negative.  Follow.   CARDIOVASCULAR.  Asymptomatic.    HEALTH MAINTENANCE.  Physical 02/18/13.   Pap (02/18/13) negative with negative HPV.  Colonoscopy 09/29/10 - two polyps.  Recommended follow up 2017.  Mammogram 03/31/13 - Birads I.

## 2013-10-16 NOTE — Assessment & Plan Note (Signed)
Hemoglobin checked in 08/04/13 - 12.   Follow.

## 2013-10-16 NOTE — Assessment & Plan Note (Signed)
Worked up by hematology.  Last wbc - stable.

## 2014-03-16 ENCOUNTER — Encounter: Payer: Self-pay | Admitting: Internal Medicine

## 2014-03-16 ENCOUNTER — Ambulatory Visit (INDEPENDENT_AMBULATORY_CARE_PROVIDER_SITE_OTHER): Payer: BC Managed Care – PPO | Admitting: Internal Medicine

## 2014-03-16 VITALS — BP 100/70 | HR 60 | Temp 98.3°F | Ht 67.0 in | Wt 145.0 lb

## 2014-03-16 DIAGNOSIS — M545 Low back pain, unspecified: Secondary | ICD-10-CM

## 2014-03-16 DIAGNOSIS — Z860100 Personal history of colon polyps, unspecified: Secondary | ICD-10-CM

## 2014-03-16 DIAGNOSIS — D72819 Decreased white blood cell count, unspecified: Secondary | ICD-10-CM

## 2014-03-16 DIAGNOSIS — K2270 Barrett's esophagus without dysplasia: Secondary | ICD-10-CM

## 2014-03-16 DIAGNOSIS — D649 Anemia, unspecified: Secondary | ICD-10-CM

## 2014-03-16 DIAGNOSIS — R0981 Nasal congestion: Secondary | ICD-10-CM

## 2014-03-16 DIAGNOSIS — Z0000 Encounter for general adult medical examination without abnormal findings: Secondary | ICD-10-CM

## 2014-03-16 LAB — COMPREHENSIVE METABOLIC PANEL
ALT: 14 U/L (ref 0–35)
AST: 15 U/L (ref 0–37)
Albumin: 4.2 g/dL (ref 3.5–5.2)
Alkaline Phosphatase: 48 U/L (ref 39–117)
BUN: 16 mg/dL (ref 6–23)
CO2: 27 mEq/L (ref 19–32)
Calcium: 9.1 mg/dL (ref 8.4–10.5)
Chloride: 104 mEq/L (ref 96–112)
Creatinine, Ser: 0.76 mg/dL (ref 0.40–1.20)
GFR: 87.67 mL/min (ref >60.00)
Glucose, Bld: 95 mg/dL (ref 70–99)
Potassium: 4.4 mEq/L (ref 3.5–5.1)
Sodium: 136 mEq/L (ref 135–145)
Total Bilirubin: 0.3 mg/dL (ref 0.2–1.2)
Total Protein: 7 g/dL (ref 6.0–8.3)

## 2014-03-16 LAB — URINALYSIS, ROUTINE W REFLEX MICROSCOPIC
Bilirubin Urine: NEGATIVE
Hgb urine dipstick: NEGATIVE
Ketones, ur: NEGATIVE
Leukocytes, UA: NEGATIVE
Nitrite: NEGATIVE
RBC / HPF: NONE SEEN (ref 0–?)
Specific Gravity, Urine: 1.01 (ref 1.000–1.030)
Total Protein, Urine: NEGATIVE
Urine Glucose: NEGATIVE
Urobilinogen, UA: 0.2 (ref 0.0–1.0)
WBC, UA: NONE SEEN (ref 0–?)
pH: 6.5 (ref 5.0–8.0)

## 2014-03-16 LAB — CBC WITH DIFFERENTIAL/PLATELET
Basophils Absolute: 0 10*3/uL (ref 0.0–0.1)
Basophils Relative: 0.9 % (ref 0.0–3.0)
Eosinophils Absolute: 0 10*3/uL (ref 0.0–0.7)
Eosinophils Relative: 0.9 % (ref 0.0–5.0)
HCT: 36.3 % (ref 36.0–46.0)
Hemoglobin: 12.4 g/dL (ref 12.0–15.0)
Lymphocytes Relative: 19.4 % (ref 12.0–46.0)
Lymphs Abs: 0.8 10*3/uL (ref 0.7–4.0)
MCHC: 34.1 g/dL (ref 30.0–36.0)
MCV: 88.2 fl (ref 78.0–100.0)
Monocytes Absolute: 0.4 10*3/uL (ref 0.1–1.0)
Monocytes Relative: 9.1 % (ref 3.0–12.0)
Neutro Abs: 3 10*3/uL (ref 1.4–7.7)
Neutrophils Relative %: 69.7 % (ref 43.0–77.0)
Platelets: 212 10*3/uL (ref 150.0–400.0)
RBC: 4.12 Mil/uL (ref 3.87–5.11)
RDW: 13.8 % (ref 11.5–15.5)
WBC: 4.3 10*3/uL (ref 4.0–10.5)

## 2014-03-16 LAB — FERRITIN: Ferritin: 8 ng/mL — ABNORMAL LOW (ref 10.0–291.0)

## 2014-03-16 NOTE — Patient Instructions (Addendum)
Saline nasal spray - flush nose at least 2-3/day.   nasacort nasal spray - 2 sprays each nostril one time per day.  Do this in the evening.   Robitussin as directed.

## 2014-03-16 NOTE — Progress Notes (Signed)
Pre visit review using our clinic review tool, if applicable. No additional management support is needed unless otherwise documented below in the visit note. 

## 2014-03-16 NOTE — Progress Notes (Signed)
Patient ID: Adrienne Phillips, female   DOB: Nov 24, 1970, 44 y.o.   MRN: [id number]   Subjective:    Patient ID: Adrienne Phillips, female    DOB: 10-29-1970, 44 y.o.   MRN: [id number]  HPI  Patient here for her physical exam.  States she is doing well.  Stress is better.  Home situation better.  Relationship better.  They have hired a new person at work, so work will be less stressful.  Overall she feels good.  Stays active.  Exercises regularly.  Eats regular meals.  No nausea or vomiting.  No bowel change.  States her husband is concerned that she drinks too much water.  States will drink 64 ounces when she exercises and more at work and at home.  Does not feel excessively thirsty.  No light headedness or dizziness.  Occasionally will notice some left low back discomfort.  Notices more when gets up.  Does not bother her when she is exercising.  No radiation of pain down her legs.     Past Medical History  Diagnosis Date  . Anemia   . Migraine   . Hypoglycemia   . Ovarian cyst   . Iron deficiency     Current Outpatient Prescriptions on File Prior to Visit  Medication Sig Dispense Refill  . ALPRAZolam (XANAX) 0.25 MG tablet Take 1 tablet (0.25 mg total) by mouth at bedtime as needed for sleep. 30 tablet 0  . Docusate Sodium (COLACE PO) Take by mouth.    . Lactobacillus (ACIDOPHILUS PO) Take by mouth daily.    . metaxalone (SKELAXIN) 800 MG tablet TAKE 1/2 TO 1 TABLET BY MOUTH AT BEDTIME AS NEEDED FOR BACK SPASMS 20 tablet 0   No current facility-administered medications on file prior to visit.    Review of Systems  Constitutional: Negative for appetite change, fatigue and unexpected weight change.  HENT: Positive for congestion (some nasal congestion and ear fullness. ) and sinus pressure (minimal sinus pressure).   Respiratory: Positive for cough (minimal cough.  no chest congestion or sob.  ). Negative for chest tightness and wheezing.   Cardiovascular: Negative for chest pain, palpitations  and leg swelling.  Gastrointestinal: Negative for nausea, vomiting, abdominal pain and diarrhea.  Genitourinary: Negative for dysuria, frequency (reports no unusual frequency. ) and hematuria.  Musculoskeletal: Positive for back pain (previous low back pain.  resolved with stopping her wine.  ). Negative for joint swelling.  Skin: Negative for color change and rash (resolved).  Neurological: Negative for dizziness, light-headedness and headaches.  Hematological: Negative for adenopathy. Does not bruise/bleed easily.  Psychiatric/Behavioral:       Stress better.  Feels she is doing better.         Objective:    Physical Exam  Constitutional: She is oriented to person, place, and time. She appears well-developed and well-nourished.  HENT:  Mouth/Throat: Oropharynx is clear and moist.  Nares - slightly erythematous turbinates.  No significant pressure to palpation over the sinuses.   Eyes: Right eye exhibits no discharge. Left eye exhibits no discharge. No scleral icterus.  Neck: Neck supple. No thyromegaly present.  Cardiovascular: Normal rate and regular rhythm.   Pulmonary/Chest: Breath sounds normal. No accessory muscle usage. No tachypnea. No respiratory distress. She has no decreased breath sounds. She has no wheezes. She has no rhonchi. Right breast exhibits no inverted nipple, no mass, no nipple discharge and no tenderness (no axillary adenopathy). Left breast exhibits no inverted nipple, no mass, no nipple  discharge and no tenderness (no axilarry adenopathy).  Abdominal: Soft. Bowel sounds are normal. There is no tenderness.  Musculoskeletal: She exhibits no edema or tenderness.  Lymphadenopathy:    She has no cervical adenopathy.  Neurological: She is alert and oriented to person, place, and time.  Skin: Skin is warm. No rash noted.  Psychiatric: She has a normal mood and affect. Her behavior is normal.    BP 100/70 mmHg  Pulse 60  Temp(Src) 98.3 F (36.8 C) (Oral)  Ht 5'  7" (1.702 m)  Wt 145 lb (65.772 kg)  BMI 22.71 kg/m2  SpO2 98%  LMP 02/23/2014 (Approximate) Wt Readings from Last 3 Encounters:  03/16/14 145 lb (65.772 kg)  10/14/13 139 lb 12 oz (63.39 kg)  02/18/13 140 lb 4 oz (63.617 kg)     Lab Results  Component Value Date   WBC 4.3 03/16/2014   HGB 12.4 03/16/2014   HCT 36.3 03/16/2014   PLT 212.0 03/16/2014   GLUCOSE 95 03/16/2014   CHOL 177 05/20/2013   TRIG 14.0 05/20/2013   HDL 97.40 05/20/2013   LDLCALC 77 05/20/2013   ALT 14 03/16/2014   AST 15 03/16/2014   NA 136 03/16/2014   K 4.4 03/16/2014   CL 104 03/16/2014   CREATININE 0.76 03/16/2014   BUN 16 03/16/2014   CO2 27 03/16/2014   TSH 1.07 05/20/2013       Assessment & Plan:   Problem List Items Addressed This Visit    Anemia    Recheck cbc today.        Relevant Orders   CBC with Differential/Platelet (Completed)   Ferritin (Completed)   Barrett's esophagus - Primary    EGD 01/05/13 - results as outlined in overview.  Recommended f/u EGD in 12/2017.  Currently asymptomatic.        Relevant Orders   Comprehensive metabolic panel (Completed)   Congestion of nasal sinus    Saline nasal spray and nasacort as directed.  Robitussin as directed.  Notify us if symptoms worsen or do not resolve.        Health care maintenance    Physical today 03/16/14.  Mammogram 03/31/13 - Birads I.  She will schedule her f/u mammogram.  Colonoscopy in 09/29/10.  Recommend f/u colonoscopy in 2017.  PAP 02/18/13 - negative with negative HPV.        History of colonic polyps    Colonoscopy 09/29/10 - two polyps.  Recommended f/u colonoscopy in 2017.        Leukopenia    Worked up by hematology.  Recheck cbc today.         Other Visit Diagnoses    Left-sided low back pain without sciatica        exercises with no problems.  check urinalysis.  will follow.  notify me if persistent.      Relevant Orders    Urinalysis, Routine w reflex microscopic (Completed)      I spent 25  minutes with the patient and more than 50% of the time was spent in consultation regarding the above.     Dale DurhamSCOTT, Girtha Kilgore, MD

## 2014-03-17 ENCOUNTER — Encounter: Payer: Self-pay | Admitting: Internal Medicine

## 2014-03-19 ENCOUNTER — Encounter: Payer: Self-pay | Admitting: Internal Medicine

## 2014-03-19 DIAGNOSIS — Z860100 Personal history of colon polyps, unspecified: Secondary | ICD-10-CM | POA: Insufficient documentation

## 2014-03-19 DIAGNOSIS — R0981 Nasal congestion: Secondary | ICD-10-CM | POA: Insufficient documentation

## 2014-03-19 DIAGNOSIS — Z0000 Encounter for general adult medical examination without abnormal findings: Secondary | ICD-10-CM | POA: Insufficient documentation

## 2014-03-19 NOTE — Assessment & Plan Note (Addendum)
Physical today 03/16/14.  Mammogram 03/31/13 - Birads I.  She will schedule her f/u mammogram.  Colonoscopy in 09/29/10.  Recommend f/u colonoscopy in 2017.  PAP 02/18/13 - negative with negative HPV.

## 2014-03-19 NOTE — Assessment & Plan Note (Signed)
Recheck cbc today.   

## 2014-03-19 NOTE — Assessment & Plan Note (Signed)
Saline nasal spray and nasacort as directed.  Robitussin as directed.  Notify us if symptoms worsen or do not resolve.

## 2014-03-19 NOTE — Assessment & Plan Note (Signed)
Colonoscopy 09/29/10 - two polyps.  Recommended f/u colonoscopy in 2017.   

## 2014-03-19 NOTE — Assessment & Plan Note (Signed)
Worked up by hematology.  Recheck cbc today.

## 2014-03-19 NOTE — Assessment & Plan Note (Signed)
EGD 01/05/13 - results as outlined in overview.  Recommended f/u EGD in 12/2017.  Currently asymptomatic.

## 2014-04-04 ENCOUNTER — Ambulatory Visit: Payer: Self-pay | Admitting: Internal Medicine

## 2014-04-04 LAB — HM MAMMOGRAPHY: HM Mammogram: NEGATIVE

## 2014-04-05 ENCOUNTER — Encounter: Payer: Self-pay | Admitting: Internal Medicine

## 2014-09-15 ENCOUNTER — Ambulatory Visit: Payer: BC Managed Care – PPO | Admitting: Internal Medicine

## 2014-11-08 ENCOUNTER — Ambulatory Visit (INDEPENDENT_AMBULATORY_CARE_PROVIDER_SITE_OTHER): Payer: BC Managed Care – PPO | Admitting: Internal Medicine

## 2014-11-08 ENCOUNTER — Other Ambulatory Visit: Payer: Self-pay | Admitting: Internal Medicine

## 2014-11-08 ENCOUNTER — Encounter: Payer: Self-pay | Admitting: Internal Medicine

## 2014-11-08 VITALS — BP 116/64 | HR 72 | Resp 12 | Ht 67.0 in | Wt 139.0 lb

## 2014-11-08 DIAGNOSIS — K2270 Barrett's esophagus without dysplasia: Secondary | ICD-10-CM

## 2014-11-08 DIAGNOSIS — D72819 Decreased white blood cell count, unspecified: Secondary | ICD-10-CM

## 2014-11-08 DIAGNOSIS — Z23 Encounter for immunization: Secondary | ICD-10-CM

## 2014-11-08 DIAGNOSIS — M5489 Other dorsalgia: Secondary | ICD-10-CM

## 2014-11-08 DIAGNOSIS — Z860100 Personal history of colon polyps, unspecified: Secondary | ICD-10-CM

## 2014-11-08 DIAGNOSIS — R300 Dysuria: Secondary | ICD-10-CM

## 2014-11-08 DIAGNOSIS — M545 Low back pain, unspecified: Secondary | ICD-10-CM | POA: Insufficient documentation

## 2014-11-08 DIAGNOSIS — D649 Anemia, unspecified: Secondary | ICD-10-CM

## 2014-11-08 DIAGNOSIS — M549 Dorsalgia, unspecified: Secondary | ICD-10-CM | POA: Insufficient documentation

## 2014-11-08 DIAGNOSIS — R8290 Unspecified abnormal findings in urine: Secondary | ICD-10-CM

## 2014-11-08 LAB — CBC WITH DIFFERENTIAL/PLATELET
Basophils Absolute: 0 10*3/uL (ref 0.0–0.1)
Basophils Relative: 1.1 % (ref 0.0–3.0)
Eosinophils Absolute: 0.1 10*3/uL (ref 0.0–0.7)
Eosinophils Relative: 1.5 % (ref 0.0–5.0)
HCT: 38 % (ref 36.0–46.0)
Hemoglobin: 12.6 g/dL (ref 12.0–15.0)
Lymphocytes Relative: 23.1 % (ref 12.0–46.0)
Lymphs Abs: 0.9 10*3/uL (ref 0.7–4.0)
MCHC: 33.3 g/dL (ref 30.0–36.0)
MCV: 87.7 fl (ref 78.0–100.0)
Monocytes Absolute: 0.5 10*3/uL (ref 0.1–1.0)
Monocytes Relative: 12.8 % — ABNORMAL HIGH (ref 3.0–12.0)
Neutro Abs: 2.4 10*3/uL (ref 1.4–7.7)
Neutrophils Relative %: 61.5 % (ref 43.0–77.0)
Platelets: 218 10*3/uL (ref 150.0–400.0)
RBC: 4.33 Mil/uL (ref 3.87–5.11)
RDW: 14.5 % (ref 11.5–15.5)
WBC: 4 10*3/uL (ref 4.0–10.5)

## 2014-11-08 LAB — POCT URINALYSIS DIPSTICK
Bilirubin, UA: NEGATIVE
Blood, UA: NEGATIVE
Glucose, UA: NEGATIVE
Ketones, UA: NEGATIVE
Leukocytes, UA: NEGATIVE
Nitrite, UA: NEGATIVE
Protein, UA: NEGATIVE
Spec Grav, UA: 1.01
Urobilinogen, UA: 0.2
pH, UA: 7.5

## 2014-11-08 LAB — FERRITIN: Ferritin: 8.7 ng/mL — ABNORMAL LOW (ref 10.0–291.0)

## 2014-11-08 NOTE — Progress Notes (Signed)
Patient ID: Jaxyn Mestas Criscione, female   DOB: 1970/02/08, 44 y.o.   MRN: [id number]   Subjective:    Patient ID: Janeann Forehand Crute, female    DOB: [birth year], 44 y.o.   MRN: [id number]  HPI  Patient with past history of anemia who comes in today for a scheduled follow up.  She is still exercising.  No cardiac symptoms with increased activity or exertion.  No sob.  No acid reflux.  No abdominal pain or cramping.  Does report noticing an odor to her urine.  Also noticed some low back aching.  Present over the last few weeks.  She also has noticed some back discomfort - notices intermittently after eating.  Stretching helps some.  Some increased gas.  LMP three weeks ago.  Was questioning a possible urinary tract infection.  No vaginal symptoms reported.  Bowels stable.    Past Medical History  Diagnosis Date  . Anemia   . Migraine   . Hypoglycemia   . Ovarian cyst   . Iron deficiency    Past Surgical History  Procedure Laterality Date  . Breast biopsy     Family History  Problem Relation Age of Onset  . Hypertension Mother     migraines  . Cancer Father     colon and a hx of skin  . Rheum arthritis Brother   . Cancer Paternal Uncle     breast cancer  . Osteoporosis Paternal Grandmother   . Ulcers Paternal Grandfather   . Cancer Maternal Aunt     Breast   Social History   Social History  . Marital Status: Married    Spouse Name: N/A  . Number of Children: 0  . Years of Education: N/A   Occupational History  .     Social History Main Topics  . Smoking status: Never Smoker   . Smokeless tobacco: Never Used  . Alcohol Use: 0.0 oz/week    0 Standard drinks or equivalent per week  . Drug Use: No  . Sexual Activity: Not Asked   Other Topics Concern  . None   Social History Narrative    Outpatient Encounter Prescriptions as of 11/08/2014  Medication Sig  . ALPRAZolam (XANAX) 0.25 MG tablet Take 1 tablet (0.25 mg total) by mouth at bedtime as needed for sleep.  Tery Sanfilippo Calcium (STOOL SOFTENER PO) Take by mouth.  . Lactobacillus (ACIDOPHILUS PO) Take by mouth daily.  Tery Sanfilippo Sodium (COLACE PO) Take by mouth.  . metaxalone (SKELAXIN) 800 MG tablet TAKE 1/2 TO 1 TABLET BY MOUTH AT BEDTIME AS NEEDED FOR BACK SPASMS (Patient not taking: Reported on 11/08/2014)   No facility-administered encounter medications on file as of 11/08/2014.    Review of Systems  Constitutional: Negative for appetite change and unexpected weight change.  HENT: Negative for congestion and sinus pressure.   Respiratory: Negative for cough, chest tightness and shortness of breath.   Cardiovascular: Negative for chest pain, palpitations and leg swelling.  Gastrointestinal: Negative for nausea, vomiting, abdominal pain and diarrhea.  Genitourinary: Negative for difficulty urinating.       Does report an odor to her urine.    Musculoskeletal: Positive for back pain (as outlined. ). Negative for joint swelling.  Skin: Negative for color change and rash.  Neurological: Negative for dizziness, light-headedness and headaches.  Psychiatric/Behavioral: Negative for dysphoric mood and agitation.       Objective:    Physical Exam  Constitutional: She appears well-developed and well-nourished. No  distress.  HENT:  Nose: Nose normal.  Mouth/Throat: Oropharynx is clear and moist.  Eyes: Conjunctivae are normal. Right eye exhibits no discharge. Left eye exhibits no discharge.  Neck: Neck supple. No thyromegaly present.  Cardiovascular: Normal rate and regular rhythm.   Pulmonary/Chest: Breath sounds normal. No respiratory distress. She has no wheezes.  Abdominal: Soft. Bowel sounds are normal. There is no tenderness.  Musculoskeletal: She exhibits no edema or tenderness.  No pain to palpation over the posterior back.    Lymphadenopathy:    She has no cervical adenopathy.  Skin: No rash noted. No erythema.  Psychiatric: She has a normal mood and affect. Her behavior is normal.      BP 116/64 mmHg  Pulse 72  Resp 12  Ht 5\' 7"  (1.702 m)  Wt 139 lb (63.05 kg)  BMI 21.77 kg/m2 Wt Readings from Last 3 Encounters:  11/08/14 139 lb (63.05 kg)  03/16/14 145 lb (65.772 kg)  10/14/13 139 lb 12 oz (63.39 kg)     Lab Results  Component Value Date   WBC 4.0 11/08/2014   HGB 12.6 11/08/2014   HCT 38.0 11/08/2014   PLT 218.0 11/08/2014   GLUCOSE 95 03/16/2014   CHOL 177 05/20/2013   TRIG 14.0 05/20/2013   HDL 97.40 05/20/2013   LDLCALC 77 05/20/2013   ALT 14 03/16/2014   AST 15 03/16/2014   NA 136 03/16/2014   K 4.4 03/16/2014   CL 104 03/16/2014   CREATININE 0.76 03/16/2014   BUN 16 03/16/2014   CO2 27 03/16/2014   TSH 1.07 05/20/2013       Assessment & Plan:   Problem List Items Addressed This Visit    Anemia    Recheck cbc and ferritin today.        Relevant Orders   CBC with Differential/Platelet (Completed)   Ferritin (Completed)   Back pain - Primary    Unclear etiology.  Occurs after eating.  Check abdominal ultrasound.  Check urine to confirm no infection.        Relevant Orders   US Abdomen Complete   Bad odor of urine    Check urinalysis to confirm no infection.        Barrett's esophagus    EGD 01/05/13 as outlined in overview.  Recommend f/u EGD in 12/2017.        History of colonic polyps    Colonoscopy 09/29/10 - two polyps.  Recommended f/u colonoscopy in 2017.        Leukopenia    Has been worked up by hematology.  Recheck cbc today.         Other Visit Diagnoses    Dysuria        Relevant Orders    POCT Urinalysis Dipstick (Completed)    Encounter for immunization            Dale DurhamSCOTT, Suleika Donavan, MD

## 2014-11-08 NOTE — Progress Notes (Signed)
Order placed for f/u urine culture.   

## 2014-11-08 NOTE — Progress Notes (Signed)
Pre visit review using our clinic review tool, if applicable. No additional management support is needed unless otherwise documented below in the visit note. 

## 2014-11-09 ENCOUNTER — Encounter: Payer: Self-pay | Admitting: Internal Medicine

## 2014-11-11 LAB — CULTURE, URINE COMPREHENSIVE: Colony Count: >=100000

## 2014-11-13 ENCOUNTER — Encounter: Payer: Self-pay | Admitting: Internal Medicine

## 2014-11-13 ENCOUNTER — Ambulatory Visit: Payer: BC Managed Care – PPO

## 2014-11-13 DIAGNOSIS — R8290 Unspecified abnormal findings in urine: Secondary | ICD-10-CM | POA: Insufficient documentation

## 2014-11-13 NOTE — Assessment & Plan Note (Signed)
Recheck cbc and ferritin today.

## 2014-11-13 NOTE — Assessment & Plan Note (Signed)
Check urinalysis to confirm no infection.  

## 2014-11-13 NOTE — Assessment & Plan Note (Signed)
Unclear etiology.  Occurs after eating.  Check abdominal ultrasound.  Check urine to confirm no infection.

## 2014-11-13 NOTE — Assessment & Plan Note (Signed)
Colonoscopy 09/29/10 - two polyps.  Recommended f/u colonoscopy in 2017.

## 2014-11-13 NOTE — Assessment & Plan Note (Signed)
EGD 01/05/13 as outlined in overview.  Recommend f/u EGD in 12/2017.

## 2014-11-13 NOTE — Assessment & Plan Note (Signed)
Has been worked up by hematology.  Recheck cbc today.

## 2014-11-14 ENCOUNTER — Telehealth: Payer: Self-pay | Admitting: *Deleted

## 2014-11-14 MED ORDER — CIPROFLOXACIN HCL 500 MG PO TABS: 500.0000 mg | ORAL_TABLET | Freq: Two times a day (BID) | ORAL | Status: AC

## 2014-11-14 NOTE — Telephone Encounter (Signed)
Rx sent in for urine infection

## 2014-12-12 ENCOUNTER — Ambulatory Visit
Admission: RE | Admit: 2014-12-12 | Discharge: 2014-12-12 | Disposition: A | Discharge status: Home / Self Care | Payer: BC Managed Care – PPO | Source: Ambulatory Visit | Attending: Internal Medicine | Admitting: Internal Medicine

## 2014-12-12 DIAGNOSIS — M5489 Other dorsalgia: Secondary | ICD-10-CM | POA: Insufficient documentation

## 2014-12-13 ENCOUNTER — Encounter: Payer: Self-pay | Admitting: Internal Medicine

## 2015-02-14 ENCOUNTER — Other Ambulatory Visit: Payer: Self-pay | Admitting: Internal Medicine

## 2015-02-22 ENCOUNTER — Other Ambulatory Visit: Payer: Self-pay | Admitting: Internal Medicine

## 2015-02-22 MED ORDER — METAXALONE 800 MG PO TABS: ORAL_TABLET | ORAL | Status: DC

## 2015-02-22 NOTE — Progress Notes (Unsigned)
ok'd refill for skelaxin #30 with no refills.  She does still need cpe.  Thanks

## 2015-02-22 NOTE — Progress Notes (Signed)
Pt has CPE Appt in April

## 2015-04-11 ENCOUNTER — Ambulatory Visit
Admission: EM | Admit: 2015-04-11 | Discharge: 2015-04-11 | Disposition: A | Discharge status: Home / Self Care | Payer: BC Managed Care – PPO | Attending: Emergency Medicine | Admitting: Emergency Medicine

## 2015-04-11 ENCOUNTER — Encounter: Payer: Self-pay | Admitting: *Deleted

## 2015-04-11 DIAGNOSIS — J069 Acute upper respiratory infection, unspecified: Secondary | ICD-10-CM

## 2015-04-11 DIAGNOSIS — J0100 Acute maxillary sinusitis, unspecified: Secondary | ICD-10-CM | POA: Diagnosis not present

## 2015-04-11 LAB — RAPID STREP SCREEN (MED CTR MEBANE ONLY): Streptococcus, Group A Screen (Direct): NEGATIVE

## 2015-04-11 MED ORDER — MOMETASONE FUROATE 50 MCG/ACT NA SUSP: 2.0000 | Freq: Every day | NASAL | Status: DC

## 2015-04-11 NOTE — ED Provider Notes (Signed)
HPI  SUBJECTIVE:  Adrienne Phillips is a 45 y.o. female who presents with ST, clear rhinorrhea, sinus pressure, postnasal drip for 3 days. She states that she has a cough productive of yellowish sputum. She states that she is unable to sleep at night secondary to the cough. No sinus pain, fatigue, malaise, body aches, chest pain, short of breath, wheezing. No fevers. No voice changes, drooling, trismus, abdominal pain, rash, GERD symptoms, allergy symptoms. No aggravating or alleviating factors. She has tried peppermint tea, eucalyptus oil, ibuprofen. She states her husband has identical symptoms. She did get a flu shot this year. She took some ibuprofen within 6 hours of evaluation. Past medical history negative for diabetes, hypertension, allergies, otitis media, sinusitis, asthma, emphysema, COPD. She is not a smoker.   Past Medical History  Diagnosis Date  . Anemia   . Migraine   . Hypoglycemia   . Ovarian cyst   . Iron deficiency     Past Surgical History  Procedure Laterality Date  . Breast biopsy      Family History  Problem Relation Age of Onset  . Hypertension Mother     migraines  . Cancer Father     colon and a hx of skin  . Rheum arthritis Brother   . Cancer Paternal Uncle     breast cancer  . Osteoporosis Paternal Grandmother   . Ulcers Paternal Grandfather   . Cancer Maternal Aunt     Breast    Social History  Substance Use Topics  . Smoking status: Never Smoker   . Smokeless tobacco: Never Used  . Alcohol Use: 0.0 oz/week    0 Standard drinks or equivalent per week    No current facility-administered medications for this encounter.  Current outpatient prescriptions:  .  ALPRAZolam (XANAX) 0.25 MG tablet, Take 1 tablet (0.25 mg total) by mouth at bedtime as needed for sleep., Disp: 30 tablet, Rfl: 0 .  Docusate Sodium (COLACE PO), Take by mouth., Disp: , Rfl:  .  metaxalone (SKELAXIN) 800 MG tablet, TAKE 1/2 TO 1 TABLET BY MOUTH AT BEDTIME AS NEEDED FOR  BACK SPASMS, Disp: 30 tablet, Rfl: 0 .  Docusate Calcium (STOOL SOFTENER PO), Take by mouth., Disp: , Rfl:  .  Lactobacillus (ACIDOPHILUS PO), Take by mouth daily., Disp: , Rfl:  .  mometasone (NASONEX) 50 MCG/ACT nasal spray, Place 2 sprays into the nose daily., Disp: 17 g, Rfl: 0  Allergies  Allergen Reactions  . Codeine   . Topamax [Topiramate]      ROS  As noted in HPI.   Physical Exam  BP 113/47 mmHg  Pulse 68  Temp(Src) 97.4 F (36.3 C) (Oral)  Resp 18  Ht  (1.702 m)  Wt 143 lb (64.864 kg)  BMI 22.39 kg/m2  SpO2 100%  LMP 04/03/2015  Constitutional: Well developed, well nourished, no acute distress Eyes:  EOMI, conjunctiva normal bilaterally HENT: Normocephalic, atraumatic,mucus membranes moist. . TMs normal bilaterally. Positive nasal congestion with erythematous swollen turbinates. Positive maxillary sinus tenderness. Minimal frontal sinus tenderness Erythematous oropharynx. Tonsils normal no exudates. Uvula midline. Positive cobblestoning, postnasal drip  Neck: Shotty cervical LN Respiratory: Normal inspiratory effort, lungs CTAB Cardiovascular: Normal rate regular rhythm, no murmurs, rubs, gallops GI: nondistended skin: No rash, skin intact Musculoskeletal: no deformities Neurologic: Alert & oriented x 3, no focal neuro deficits Psychiatric: Speech and behavior appropriate   ED Course   Medications - No data to display  Orders Placed This Encounter  Procedures  .  Rapid strep screen    Standing Status: Standing     Number of Occurrences: 1     Standing Expiration Date:     Order Specific Question:  Patient immune status    Answer:  Immunocompromised  . Culture, group A strep    Standing Status: Standing     Number of Occurrences: 1     Standing Expiration Date:     Results for orders placed or performed during the hospital encounter of 04/11/15 (from the past 24 hour(s))  Rapid strep screen     Status: None   Collection Time: 04/11/15 11:39  AM  Result Value Ref Range   Streptococcus, Group A Screen (Direct) NEGATIVE NEGATIVE   No results found.  ED Clinical Impression  URI (upper respiratory infection)  Acute maxillary sinusitis, recurrence not specified  ED Assessment/Plan  Strep negative. Presentation most consistent with URI. Home with Mucinex D, saline nasal irrigation, nasal steroid, Afrin prior to getting on the plane. Follow up with primary care physician or here if double sickening, not improving in 10 days. Patient agrees with plan    *This clinic note was created using Dragon dictation software. Therefore, there may be occasional mistakes despite careful proofreading.  ?    Domenick GongAshley Shiane Wenberg, MD 04/11/15 1321

## 2015-04-11 NOTE — ED Notes (Signed)
Patient started having symptoms of sore throat and nasal congestion this 3 days ago. No other symptoms reported.

## 2015-04-11 NOTE — Discharge Instructions (Signed)
Take the medication as written. Return if you get worse, have a fever >100.4, or for any concerns. You may take 800 mg of motrin with 1 gram of tylenol up to 3 times a day as needed for pain. This is an effective combination for pain.  Most sinus infections are viral and do not need antibiotics unless you have a high fever, have had this for 10 days, or you get better and then get sick again. Use a neti pot or the NeilMed sinus rinse as often as you want to to reduce nasal congestion. Follow the directions on the box.  ° °Go to www.goodrx.com to look up your medications. This will give you a list of where you can find your prescriptions at the most affordable prices.  ° °

## 2015-04-13 LAB — CULTURE, GROUP A STREP (THRC)

## 2015-05-04 ENCOUNTER — Encounter: Payer: BC Managed Care – PPO | Admitting: Internal Medicine

## 2015-06-15 ENCOUNTER — Encounter: Payer: BC Managed Care – PPO | Admitting: Internal Medicine

## 2015-06-27 ENCOUNTER — Telehealth: Payer: Self-pay

## 2015-06-27 ENCOUNTER — Ambulatory Visit (INDEPENDENT_AMBULATORY_CARE_PROVIDER_SITE_OTHER): Payer: BC Managed Care – PPO | Admitting: Internal Medicine

## 2015-06-27 ENCOUNTER — Encounter: Payer: Self-pay | Admitting: Internal Medicine

## 2015-06-27 ENCOUNTER — Other Ambulatory Visit (HOSPITAL_COMMUNITY)
Admission: RE | Admit: 2015-06-27 | Discharge: 2015-06-27 | Disposition: A | Discharge status: Home / Self Care | Payer: BC Managed Care – PPO | Source: Ambulatory Visit | Attending: Internal Medicine | Admitting: Internal Medicine

## 2015-06-27 VITALS — BP 106/72 | HR 78 | Temp 98.6°F | Resp 16 | Ht 67.0 in | Wt 142.0 lb

## 2015-06-27 DIAGNOSIS — Z124 Encounter for screening for malignant neoplasm of cervix: Secondary | ICD-10-CM | POA: Diagnosis not present

## 2015-06-27 DIAGNOSIS — R200 Anesthesia of skin: Secondary | ICD-10-CM | POA: Insufficient documentation

## 2015-06-27 DIAGNOSIS — Z860100 Personal history of colon polyps, unspecified: Secondary | ICD-10-CM

## 2015-06-27 DIAGNOSIS — M542 Cervicalgia: Secondary | ICD-10-CM | POA: Insufficient documentation

## 2015-06-27 DIAGNOSIS — K2270 Barrett's esophagus without dysplasia: Secondary | ICD-10-CM

## 2015-06-27 DIAGNOSIS — D72819 Decreased white blood cell count, unspecified: Secondary | ICD-10-CM | POA: Diagnosis not present

## 2015-06-27 DIAGNOSIS — Z1151 Encounter for screening for human papillomavirus (HPV): Secondary | ICD-10-CM | POA: Diagnosis present

## 2015-06-27 DIAGNOSIS — R208 Other disturbances of skin sensation: Secondary | ICD-10-CM

## 2015-06-27 DIAGNOSIS — D649 Anemia, unspecified: Secondary | ICD-10-CM | POA: Diagnosis not present

## 2015-06-27 DIAGNOSIS — R4586 Emotional lability: Secondary | ICD-10-CM

## 2015-06-27 DIAGNOSIS — R079 Chest pain, unspecified: Secondary | ICD-10-CM | POA: Diagnosis not present

## 2015-06-27 DIAGNOSIS — Z0000 Encounter for general adult medical examination without abnormal findings: Secondary | ICD-10-CM

## 2015-06-27 DIAGNOSIS — Z01411 Encounter for gynecological examination (general) (routine) with abnormal findings: Secondary | ICD-10-CM | POA: Insufficient documentation

## 2015-06-27 LAB — TSH: TSH: 1.01 u[IU]/mL (ref 0.35–4.50)

## 2015-06-27 LAB — COMPREHENSIVE METABOLIC PANEL WITH GFR
ALT: 14 U/L (ref 0–35)
AST: 17 U/L (ref 0–37)
Albumin: 4.4 g/dL (ref 3.5–5.2)
Alkaline Phosphatase: 48 U/L (ref 39–117)
BUN: 9 mg/dL (ref 6–23)
CO2: 29 meq/L (ref 19–32)
Calcium: 9 mg/dL (ref 8.4–10.5)
Chloride: 101 meq/L (ref 96–112)
Creatinine, Ser: 0.67 mg/dL (ref 0.40–1.20)
GFR: 100.81 mL/min (ref >60.00)
Glucose, Bld: 149 mg/dL — ABNORMAL HIGH (ref 70–99)
Potassium: 4.4 meq/L (ref 3.5–5.1)
Sodium: 136 meq/L (ref 135–145)
Total Bilirubin: 0.3 mg/dL (ref 0.2–1.2)
Total Protein: 6.9 g/dL (ref 6.0–8.3)

## 2015-06-27 LAB — CBC WITH DIFFERENTIAL/PLATELET
Basophils Absolute: 0 10*3/uL (ref 0.0–0.1)
Basophils Relative: 0.8 % (ref 0.0–3.0)
Eosinophils Absolute: 0 10*3/uL (ref 0.0–0.7)
Eosinophils Relative: 0.9 % (ref 0.0–5.0)
HCT: 37.1 % (ref 36.0–46.0)
Hemoglobin: 12.2 g/dL (ref 12.0–15.0)
Lymphocytes Relative: 20.8 % (ref 12.0–46.0)
Lymphs Abs: 1.1 10*3/uL (ref 0.7–4.0)
MCHC: 33 g/dL (ref 30.0–36.0)
MCV: 84.9 fl (ref 78.0–100.0)
Monocytes Absolute: 0.4 10*3/uL (ref 0.1–1.0)
Monocytes Relative: 7.3 % (ref 3.0–12.0)
Neutro Abs: 3.6 10*3/uL (ref 1.4–7.7)
Neutrophils Relative %: 70.2 % (ref 43.0–77.0)
Platelets: 281 10*3/uL (ref 150.0–400.0)
RBC: 4.37 Mil/uL (ref 3.87–5.11)
RDW: 15.2 % (ref 11.5–15.5)
WBC: 5.1 10*3/uL (ref 4.0–10.5)

## 2015-06-27 LAB — FERRITIN: Ferritin: 6.7 ng/mL — ABNORMAL LOW (ref 10.0–291.0)

## 2015-06-27 NOTE — Telephone Encounter (Signed)
During lab draw pt requested to have FSH and LH done. Okay to add on labs?

## 2015-06-27 NOTE — Progress Notes (Signed)
Patient ID: Adrienne Phillips, female   DOB: [birth year], 45 y.o.   MRN: [id number]   Subjective:    Patient ID: Adrienne Phillips, female    DOB: [birth year], 45 y.o.   MRN: [id number]  HPI  Patient here for her physical exam.  She reports increased stress with her job.  Discussed with her today.  She also reports some left arm numbness.  She has neck issues.  Previous MRI - disc disease, etc.  See report.  States when feels more stressed, will notice some chest discomfort and then will notice some left arm numbness.  Some neck tension.  States this has occurred four times since 02/2015.  She exercises regularly and does strenuous exercise.  States she has tried to reproduce her symptoms with exercise and has not been able to.  Reports no chest pain, tightness or sob with exercise and exertion.  No weakness of her arm.  She is not eating meat.  Taking iron.  No nausea or vomiting.  Bowels stable.  Relationship going well.     Past Medical History  Diagnosis Date  . Anemia   . Migraine   . Hypoglycemia   . Ovarian cyst   . Iron deficiency    Past Surgical History  Procedure Laterality Date  . Breast biopsy     Family History  Problem Relation Age of Onset  . Hypertension Mother     migraines  . Cancer Father     colon and a hx of skin  . Rheum arthritis Brother   . Cancer Paternal Uncle     breast cancer  . Osteoporosis Paternal Grandmother   . Ulcers Paternal Grandfather   . Cancer Maternal Aunt     Breast   Social History   Social History  . Marital Status: Married    Spouse Name: N/A  . Number of Children: 0  . Years of Education: N/A   Occupational History  .     Social History Main Topics  . Smoking status: Never Smoker   . Smokeless tobacco: Never Used  . Alcohol Use: 0.0 oz/week    0 Standard drinks or equivalent per week  . Drug Use: No  . Sexual Activity: Not Asked   Other Topics Concern  . None   Social History Narrative    Outpatient Encounter  Prescriptions as of 06/27/2015  Medication Sig  . ALPRAZolam (XANAX) 0.25 MG tablet Take 1 tablet (0.25 mg total) by mouth at bedtime as needed for sleep.  Tery Sanfilippo Calcium (STOOL SOFTENER PO) Take by mouth.  Tery Sanfilippo Sodium (COLACE PO) Take by mouth.  . ferrous sulfate 325 (65 FE) MG tablet Take 325 mg by mouth daily with breakfast.  . Lactobacillus (ACIDOPHILUS PO) Take by mouth daily.  . metaxalone (SKELAXIN) 800 MG tablet TAKE 1/2 TO 1 TABLET BY MOUTH AT BEDTIME AS NEEDED FOR BACK SPASMS  . [DISCONTINUED] mometasone (NASONEX) 50 MCG/ACT nasal spray Place 2 sprays into the nose daily. (Patient not taking: Reported on 06/27/2015)   No facility-administered encounter medications on file as of 06/27/2015.    Review of Systems  Constitutional: Positive for fatigue. Negative for unexpected weight change.  HENT: Negative for congestion and sinus pressure.   Eyes: Negative for pain and visual disturbance.  Respiratory: Negative for cough, chest tightness and shortness of breath.   Cardiovascular: Positive for chest pain. Negative for palpitations and leg swelling.  Gastrointestinal: Negative for nausea, vomiting, abdominal pain and diarrhea.  Genitourinary: Negative  for dysuria and difficulty urinating.  Musculoskeletal: Negative for back pain and joint swelling.  Skin: Negative for color change and rash.  Neurological: Negative for dizziness, light-headedness and headaches.       Left arm numbness as outlined.  No weakness.    Hematological: Negative for adenopathy. Does not bruise/bleed easily.  Psychiatric/Behavioral: Negative for dysphoric mood and agitation.       Objective:    Physical Exam  Constitutional: She is oriented to person, place, and time. She appears well-developed and well-nourished. No distress.  HENT:  Nose: Nose normal.  Mouth/Throat: Oropharynx is clear and moist.  Eyes: Right eye exhibits no discharge. Left eye exhibits no discharge. No scleral icterus.  Neck:  Neck supple. No thyromegaly present.  Cardiovascular: Normal rate and regular rhythm.   Pulmonary/Chest: Breath sounds normal. No accessory muscle usage. No tachypnea. No respiratory distress. She has no decreased breath sounds. She has no wheezes. She has no rhonchi. Right breast exhibits no inverted nipple, no mass, no nipple discharge and no tenderness (no axillary adenopathy). Left breast exhibits no inverted nipple, no mass, no nipple discharge and no tenderness (no axilarry adenopathy).  Abdominal: Soft. Bowel sounds are normal. There is no tenderness.  Genitourinary:  Normal external genitalia.  Vaginal vault without lesions.  Cervix identified.  Pap smear performed.  Could not appreciate any adnexal masses or tenderness.    Musculoskeletal: She exhibits no edema or tenderness.  Some increased discomfort in her neck with rotation of her head.  No motor weakness - upper extremity.  Grip strength normal.   Lymphadenopathy:    She has no cervical adenopathy.  Neurological: She is alert and oriented to person, place, and time.  Skin: Skin is warm. No rash noted. No erythema.  Psychiatric: She has a normal mood and affect. Her behavior is normal.    BP 106/72 mmHg  Pulse 78  Temp(Src) 98.6 F (37 C) (Oral)  Resp 16  Ht 5\' 7"  (1.702 m)  Wt 142 lb (64.411 kg)  BMI 22.24 kg/m2  SpO2 97%  LMP 06/17/2015 Wt Readings from Last 3 Encounters:  06/27/15 142 lb (64.411 kg)  04/11/15 143 lb (64.864 kg)  11/08/14 139 lb (63.05 kg)     Lab Results  Component Value Date   WBC 5.1 06/27/2015   HGB 12.2 06/27/2015   HCT 37.1 06/27/2015   PLT 281.0 06/27/2015   GLUCOSE 149* 06/27/2015   CHOL 177 05/20/2013   TRIG 14.0 05/20/2013   HDL 97.40 05/20/2013   LDLCALC 77 05/20/2013   ALT 14 06/27/2015   AST 17 06/27/2015   NA 136 06/27/2015   K 4.4 06/27/2015   CL 101 06/27/2015   CREATININE 0.67 06/27/2015   BUN 9 06/27/2015   CO2 29 06/27/2015   TSH 1.01 06/27/2015       Assessment  & Plan:   Problem List Items Addressed This Visit    Anemia    Recheck cbc and ferritin.       Relevant Medications   ferrous sulfate 325 (65 FE) MG tablet   Other Relevant Orders   CBC with Differential/Platelet (Completed)   Ferritin (Completed)   Barrett's esophagus    EGD 01/05/13.  Recommended f/u EGD in 12/2017.       Chest pain - Primary    Chest pain as outlined.  Not reproducible with exercise.  EKG - SR with no acute ischemic changes.  Doubt symptoms from cardiac origin.  Discussed with her at length.  Hold on further cardiac testing.  Check c-spine.        Relevant Orders   EKG 12-Lead (Completed)   TSH (Completed)   Comprehensive metabolic panel (Completed)   Health care maintenance    Physical today 06/27/15.  PAP 06/27/15.  Colonoscopy 2012.  Recommended f/u colonoscopy in 2017.  Schedule mammogram.        History of colonic polyps    Colonoscopy 09/29/10 - two polyps.  Recommended f/u colonoscopy in 2017.        Left arm numbness    Symptoms and exam as outlined.  EKG obtained given chest discomfort.  EKG revealed SR with no acute ischemic changes.  Feel symptoms related to her neck.  Check MRI c-spine.        Relevant Orders   MR Cervical Spine Wo Contrast   Leukopenia    Has been worked up by hematology.  Recheck cbc.       Neck pain    Symptoms and exam as outlined.  See above.  Check c-spine.        Relevant Orders   MR Cervical Spine Wo Contrast    Other Visit Diagnoses    Pap smear for cervical cancer screening        Relevant Orders    Cytology - PAP        Dale [place], MD

## 2015-06-28 ENCOUNTER — Other Ambulatory Visit (INDEPENDENT_AMBULATORY_CARE_PROVIDER_SITE_OTHER): Payer: BC Managed Care – PPO

## 2015-06-28 DIAGNOSIS — F39 Unspecified mood [affective] disorder: Secondary | ICD-10-CM | POA: Diagnosis not present

## 2015-06-28 DIAGNOSIS — R4586 Emotional lability: Secondary | ICD-10-CM

## 2015-06-28 LAB — FOLLICLE STIMULATING HORMONE: FSH: 37 m[IU]/mL

## 2015-06-28 LAB — CYTOLOGY - PAP

## 2015-06-28 NOTE — Telephone Encounter (Signed)
Lab added

## 2015-06-28 NOTE — Telephone Encounter (Signed)
Ordered FSH.  Thanks

## 2015-06-30 ENCOUNTER — Encounter: Payer: Self-pay | Admitting: Internal Medicine

## 2015-06-30 DIAGNOSIS — R079 Chest pain, unspecified: Secondary | ICD-10-CM | POA: Insufficient documentation

## 2015-06-30 NOTE — Assessment & Plan Note (Signed)
Has been worked up by hematology.  Recheck cbc.  

## 2015-06-30 NOTE — Assessment & Plan Note (Signed)
Recheck cbc and ferritin.   

## 2015-06-30 NOTE — Assessment & Plan Note (Signed)
Chest pain as outlined.  Not reproducible with exercise.  EKG - SR with no acute ischemic changes.  Doubt symptoms from cardiac origin.  Discussed with her at length.  Hold on further cardiac testing.  Check c-spine.

## 2015-06-30 NOTE — Assessment & Plan Note (Signed)
Colonoscopy 09/29/10 - two polyps.  Recommended f/u colonoscopy in 2017.   

## 2015-06-30 NOTE — Assessment & Plan Note (Signed)
Symptoms and exam as outlined.  See above.  Check c-spine.

## 2015-06-30 NOTE — Assessment & Plan Note (Signed)
Physical today 06/27/15.  PAP 06/27/15.  Colonoscopy 2012.  Recommended f/u colonoscopy in 2017.  Schedule mammogram.

## 2015-06-30 NOTE — Assessment & Plan Note (Signed)
Symptoms and exam as outlined.  EKG obtained given chest discomfort.  EKG revealed SR with no acute ischemic changes.  Feel symptoms related to her neck.  Check MRI c-spine.

## 2015-06-30 NOTE — Assessment & Plan Note (Signed)
EGD 01/05/13.  Recommended f/u EGD in 12/2017.

## 2015-07-02 ENCOUNTER — Other Ambulatory Visit: Payer: Self-pay | Admitting: Internal Medicine

## 2015-07-02 ENCOUNTER — Encounter: Payer: Self-pay | Admitting: Internal Medicine

## 2015-07-02 DIAGNOSIS — R739 Hyperglycemia, unspecified: Secondary | ICD-10-CM

## 2015-07-02 DIAGNOSIS — Z13220 Encounter for screening for lipoid disorders: Secondary | ICD-10-CM

## 2015-07-02 NOTE — Progress Notes (Signed)
Orders placed for f/u labs.  

## 2015-07-04 ENCOUNTER — Encounter: Payer: Self-pay | Admitting: *Deleted

## 2015-07-05 ENCOUNTER — Encounter: Payer: Self-pay | Admitting: Internal Medicine

## 2015-07-05 NOTE — Telephone Encounter (Signed)
Unread mychart message mailed to patient 

## 2015-07-18 ENCOUNTER — Ambulatory Visit: Payer: BC Managed Care – PPO

## 2015-07-23 ENCOUNTER — Other Ambulatory Visit: Payer: BC Managed Care – PPO

## 2015-09-28 ENCOUNTER — Encounter: Payer: Self-pay | Admitting: Internal Medicine

## 2015-09-28 ENCOUNTER — Ambulatory Visit (INDEPENDENT_AMBULATORY_CARE_PROVIDER_SITE_OTHER): Payer: BC Managed Care – PPO | Admitting: Internal Medicine

## 2015-09-28 VITALS — BP 100/68 | HR 67 | Temp 97.9°F | Ht 67.0 in | Wt 145.8 lb

## 2015-09-28 DIAGNOSIS — Z13220 Encounter for screening for lipoid disorders: Secondary | ICD-10-CM

## 2015-09-28 DIAGNOSIS — R208 Other disturbances of skin sensation: Secondary | ICD-10-CM | POA: Diagnosis not present

## 2015-09-28 DIAGNOSIS — R739 Hyperglycemia, unspecified: Secondary | ICD-10-CM

## 2015-09-28 DIAGNOSIS — M542 Cervicalgia: Secondary | ICD-10-CM | POA: Diagnosis not present

## 2015-09-28 DIAGNOSIS — D72819 Decreased white blood cell count, unspecified: Secondary | ICD-10-CM

## 2015-09-28 DIAGNOSIS — K2270 Barrett's esophagus without dysplasia: Secondary | ICD-10-CM

## 2015-09-28 DIAGNOSIS — Z1239 Encounter for other screening for malignant neoplasm of breast: Secondary | ICD-10-CM | POA: Diagnosis not present

## 2015-09-28 DIAGNOSIS — D649 Anemia, unspecified: Secondary | ICD-10-CM

## 2015-09-28 DIAGNOSIS — R200 Anesthesia of skin: Secondary | ICD-10-CM

## 2015-09-28 NOTE — Progress Notes (Signed)
Pre visit review using our clinic review tool, if applicable. No additional management support is needed unless otherwise documented below in the visit note. 

## 2015-09-28 NOTE — Progress Notes (Signed)
Patient ID: Adrienne ForehandLisa Dameron Phillips, female   DOB: [birth year], 45 y.o.   MRN: [id number]   Subjective:    Patient ID: Adrienne Phillips, female    DOB: [birth year], 45 y.o.   MRN: [id number]  HPI  Patient here for a scheduled follow up.  She feels good.  Is running.  Planning to run a marathon soon.  No chest pain.  No sob.  No acid reflux.  No abdominal pain or cramping.  Bowels stable.  Handling stress.  Relationship better.  She is eating molasses and taking her iron two days per week.  Feels better.     Past Medical History:  Diagnosis Date  . Anemia   . Hypoglycemia   . Iron deficiency   . Migraine   . Ovarian cyst    Past Surgical History:  Procedure Laterality Date  . BREAST BIOPSY     Family History  Problem Relation Age of Onset  . Hypertension Mother     migraines  . Cancer Father     colon and a hx of skin  . Rheum arthritis Brother   . Cancer Paternal Uncle     breast cancer  . Osteoporosis Paternal Grandmother   . Ulcers Paternal Grandfather   . Cancer Maternal Aunt     Breast   Social History   Social History  . Marital status: Married    Spouse name: N/A  . Number of children: 0  . Years of education: N/A   Occupational History  .  [employer]   Social History Main Topics  . Smoking status: Never Smoker  . Smokeless tobacco: Never Used  . Alcohol use 0.0 oz/week  . Drug use: No  . Sexual activity: Not Asked   Other Topics Concern  . None   Social History Narrative  . None    Outpatient Encounter Prescriptions as of 09/28/2015  Medication Sig  . ALPRAZolam (XANAX) 0.25 MG tablet Take 1 tablet (0.25 mg total) by mouth at bedtime as needed for sleep.  Tery Sanfilippo. Docusate Calcium (STOOL SOFTENER PO) Take by mouth.  Tery Sanfilippo. Docusate Sodium (COLACE PO) Take by mouth.  . ferrous sulfate 325 (65 FE) MG tablet Take 325 mg by mouth daily with breakfast.  . Lactobacillus (ACIDOPHILUS PO) Take by mouth daily.  . metaxalone (SKELAXIN) 800 MG tablet TAKE 1/2 TO 1 TABLET BY MOUTH  AT BEDTIME AS NEEDED FOR BACK SPASMS   No facility-administered encounter medications on file as of 09/28/2015.     Review of Systems  Constitutional: Negative for appetite change and unexpected weight change.  HENT: Negative for congestion and sinus pressure.   Respiratory: Negative for cough, chest tightness and shortness of breath.   Cardiovascular: Negative for chest pain, palpitations and leg swelling.  Gastrointestinal: Negative for abdominal pain, diarrhea, nausea and vomiting.  Genitourinary: Negative for difficulty urinating and dysuria.  Musculoskeletal: Negative for back pain and joint swelling.  Skin: Negative for color change and rash.  Neurological: Negative for dizziness, light-headedness and headaches.  Psychiatric/Behavioral: Negative for agitation and dysphoric mood.       Objective:    Physical Exam  Constitutional: She appears well-developed and well-nourished. No distress.  HENT:  Nose: Nose normal.  Mouth/Throat: Oropharynx is clear and moist.  Neck: Neck supple. No thyromegaly present.  Cardiovascular: Normal rate and regular rhythm.   Pulmonary/Chest: Breath sounds normal. No respiratory distress. She has no wheezes.  Abdominal: Soft. Bowel sounds are normal. There is no tenderness.  Musculoskeletal: She exhibits  no edema or tenderness.  Lymphadenopathy:    She has no cervical adenopathy.  Skin: No rash noted. No erythema.  Psychiatric: She has a normal mood and affect. Her behavior is normal.    BP 100/68   Pulse 67   Temp 97.9 F (36.6 C) (Oral)   Ht 5\' 7"  (1.702 m)   Wt 145 lb 12.8 oz (66.1 kg)   LMP 09/07/2015 (Exact Date)   SpO2 100%   BMI 22.84 kg/m  Wt Readings from Last 3 Encounters:  09/28/15 145 lb 12.8 oz (66.1 kg)  06/27/15 142 lb (64.4 kg)  04/11/15 143 lb (64.9 kg)     Lab Results  Component Value Date   WBC 5.1 06/27/2015   HGB 12.2 06/27/2015   HCT 37.1 06/27/2015   PLT 281.0 06/27/2015   GLUCOSE 149 (H) 06/27/2015    CHOL 177 05/20/2013   TRIG 14.0 05/20/2013   HDL 97.40 05/20/2013   LDLCALC 77 05/20/2013   ALT 14 06/27/2015   AST 17 06/27/2015   NA 136 06/27/2015   K 4.4 06/27/2015   CL 101 06/27/2015   CREATININE 0.67 06/27/2015   BUN 9 06/27/2015   CO2 29 06/27/2015   TSH 1.01 06/27/2015       Assessment & Plan:   Problem List Items Addressed This Visit    Anemia    Recheck cbc and ferritin.        Relevant Orders   Comprehensive metabolic panel   Ferritin   Barrett's esophagus    EGD 01/05/13.  Recommended f/u EGD in 12/2017.        Left arm numbness    No arm numbness now.  No chest pain.  Very active.        Leukopenia    Has been worked up by hematology.  Follow cbc.       Relevant Orders   CBC with Differential/Platelet   Neck pain    Neck pain better.  Follow.         Other Visit Diagnoses    Breast cancer screening    -  Primary   Relevant Orders   MM DIGITAL SCREENING BILATERAL   Hyperglycemia       Relevant Orders   Hemoglobin A1c   Screening cholesterol level       Relevant Orders   Lipid panel       Dale [place], MD

## 2015-09-30 ENCOUNTER — Encounter: Payer: Self-pay | Admitting: Internal Medicine

## 2015-09-30 NOTE — Assessment & Plan Note (Signed)
Neck pain better.  Follow.

## 2015-09-30 NOTE — Assessment & Plan Note (Signed)
Recheck cbc and ferritin.   

## 2015-09-30 NOTE — Assessment & Plan Note (Signed)
EGD 01/05/13.  Recommended f/u EGD in 12/2017.

## 2015-09-30 NOTE — Assessment & Plan Note (Signed)
Has been worked up by hematology.  Follow cbc.  

## 2015-09-30 NOTE — Assessment & Plan Note (Signed)
No arm numbness now.  No chest pain.  Very active.

## 2015-10-03 ENCOUNTER — Encounter: Payer: Self-pay | Admitting: Internal Medicine

## 2015-10-18 ENCOUNTER — Ambulatory Visit: Admission: RE | Admit: 2015-10-18 | Payer: BC Managed Care – PPO | Source: Ambulatory Visit

## 2015-11-02 ENCOUNTER — Ambulatory Visit
Admission: RE | Admit: 2015-11-02 | Discharge: 2015-11-02 | Disposition: A | Discharge status: Home / Self Care | Payer: BC Managed Care – PPO | Source: Ambulatory Visit | Attending: Internal Medicine | Admitting: Internal Medicine

## 2015-11-02 ENCOUNTER — Other Ambulatory Visit
Admission: RE | Admit: 2015-11-02 | Discharge: 2015-11-02 | Disposition: A | Discharge status: Home / Self Care | Payer: BC Managed Care – PPO | Source: Ambulatory Visit | Attending: Internal Medicine | Admitting: Internal Medicine

## 2015-11-02 ENCOUNTER — Other Ambulatory Visit: Payer: Self-pay | Admitting: Internal Medicine

## 2015-11-02 DIAGNOSIS — Z1231 Encounter for screening mammogram for malignant neoplasm of breast: Secondary | ICD-10-CM | POA: Insufficient documentation

## 2015-11-02 DIAGNOSIS — Z1239 Encounter for other screening for malignant neoplasm of breast: Secondary | ICD-10-CM

## 2015-11-02 LAB — CBC WITH DIFFERENTIAL/PLATELET
Basophils Absolute: 0.1 10*3/uL (ref 0–0.1)
Basophils Relative: 3 %
Eosinophils Absolute: 0 10*3/uL (ref 0–0.7)
Eosinophils Relative: 1 %
HCT: 38.8 % (ref 35.0–47.0)
Hemoglobin: 12.8 g/dL (ref 12.0–16.0)
Lymphocytes Relative: 21 %
Lymphs Abs: 0.7 10*3/uL — ABNORMAL LOW (ref 1.0–3.6)
MCH: 28.8 pg (ref 26.0–34.0)
MCHC: 33.1 g/dL (ref 32.0–36.0)
MCV: 87.2 fL (ref 80.0–100.0)
Monocytes Absolute: 0.4 10*3/uL (ref 0.2–0.9)
Monocytes Relative: 10 %
Neutro Abs: 2.3 10*3/uL (ref 1.4–6.5)
Neutrophils Relative %: 65 %
Platelets: 198 10*3/uL (ref 150–440)
RBC: 4.45 MIL/uL (ref 3.80–5.20)
RDW: 14.4 % (ref 11.5–14.5)
WBC: 3.6 10*3/uL (ref 3.6–11.0)

## 2015-11-02 LAB — COMPREHENSIVE METABOLIC PANEL
ALT: 15 U/L (ref 14–54)
AST: 18 U/L (ref 15–41)
Albumin: 4.4 g/dL (ref 3.5–5.0)
Alkaline Phosphatase: 46 U/L (ref 38–126)
Anion gap: 8 (ref 5–15)
BUN: 14 mg/dL (ref 6–20)
CO2: 25 mmol/L (ref 22–32)
Calcium: 8.6 mg/dL — ABNORMAL LOW (ref 8.9–10.3)
Chloride: 102 mmol/L (ref 101–111)
Creatinine, Ser: 0.67 mg/dL (ref 0.44–1.00)
GFR calc Af Amer: >60 mL/min (ref >60)
GFR calc non Af Amer: >60 mL/min (ref >60)
Glucose, Bld: 91 mg/dL (ref 65–99)
Potassium: 4.6 mmol/L (ref 3.5–5.1)
Sodium: 135 mmol/L (ref 135–145)
Total Bilirubin: 0.5 mg/dL (ref 0.3–1.2)
Total Protein: 7.2 g/dL (ref 6.5–8.1)

## 2015-11-02 LAB — FERRITIN: Ferritin: 15 ng/mL (ref 11–307)

## 2015-11-02 LAB — LIPID PANEL
Cholesterol: 186 mg/dL (ref 0–200)
HDL: 94 mg/dL (ref >40)
LDL Cholesterol: 84 mg/dL (ref 0–99)
Total CHOL/HDL Ratio: 2 RATIO
Triglycerides: 40 mg/dL (ref <150)
VLDL: 8 mg/dL (ref 0–40)

## 2015-11-03 ENCOUNTER — Encounter: Payer: Self-pay | Admitting: Internal Medicine

## 2015-11-03 LAB — HEMOGLOBIN A1C
Hgb A1c MFr Bld: 5.4 % (ref 4.8–5.6)
Mean Plasma Glucose: 108 mg/dL

## 2016-01-03 ENCOUNTER — Ambulatory Visit (INDEPENDENT_AMBULATORY_CARE_PROVIDER_SITE_OTHER): Payer: BC Managed Care – PPO | Admitting: Internal Medicine

## 2016-01-03 ENCOUNTER — Other Ambulatory Visit (HOSPITAL_COMMUNITY)
Admission: RE | Admit: 2016-01-03 | Discharge: 2016-01-03 | Disposition: A | Discharge status: Home / Self Care | Payer: BC Managed Care – PPO | Source: Ambulatory Visit | Attending: Internal Medicine | Admitting: Internal Medicine

## 2016-01-03 ENCOUNTER — Ambulatory Visit: Payer: BC Managed Care – PPO | Admitting: Internal Medicine

## 2016-01-03 ENCOUNTER — Encounter: Payer: Self-pay | Admitting: Internal Medicine

## 2016-01-03 VITALS — BP 110/80 | HR 64 | Wt 145.0 lb

## 2016-01-03 DIAGNOSIS — D72819 Decreased white blood cell count, unspecified: Secondary | ICD-10-CM

## 2016-01-03 DIAGNOSIS — Z01411 Encounter for gynecological examination (general) (routine) with abnormal findings: Secondary | ICD-10-CM | POA: Insufficient documentation

## 2016-01-03 DIAGNOSIS — K2270 Barrett's esophagus without dysplasia: Secondary | ICD-10-CM

## 2016-01-03 DIAGNOSIS — R87610 Atypical squamous cells of undetermined significance on cytologic smear of cervix (ASC-US): Secondary | ICD-10-CM

## 2016-01-03 DIAGNOSIS — M542 Cervicalgia: Secondary | ICD-10-CM

## 2016-01-03 DIAGNOSIS — Z1151 Encounter for screening for human papillomavirus (HPV): Secondary | ICD-10-CM | POA: Diagnosis present

## 2016-01-03 DIAGNOSIS — Z86010 Personal history of colonic polyps: Secondary | ICD-10-CM

## 2016-01-03 DIAGNOSIS — D649 Anemia, unspecified: Secondary | ICD-10-CM

## 2016-01-03 NOTE — Progress Notes (Addendum)
Patient ID: Adrienne ForehandLisa Dameron Phillips, female   DOB: [birth year], 45 y.o.   MRN: [id number]     Subjective:    Patient ID: Adrienne Phillips, female    DOB: [birth year], 45 y.o.   MRN: [id number]  HPI  Patient here for a scheduled follow up.  She states she is doing well.  Feels good.  Still exercising.  Did not change jobs.  Enjoys her job.  Some increased stress with this, but overall doing well.  Decreased stress at home.  Husband got a new job.  No chest pain.  No sob.  Neck is stable.  No significant problems currently.  Eating and drinking well.  No nausea or vomiting.  Bowels stable.     Past Medical History:  Diagnosis Date  . Anemia   . Hypoglycemia   . Iron deficiency   . Migraine   . Ovarian cyst    Past Surgical History:  Procedure Laterality Date  . BREAST BIOPSY     Family History  Problem Relation Age of Onset  . Hypertension Mother     migraines  . Cancer Father     colon and a hx of skin  . Rheum arthritis Brother   . Cancer Paternal Uncle     breast cancer  . Osteoporosis Paternal Grandmother   . Ulcers Paternal Grandfather   . Cancer Maternal Aunt     Breast   Social History   Social History  . Marital status: Married    Spouse name: N/A  . Number of children: 0  . Years of education: N/A   Occupational History  .  [employer]   Social History Main Topics  . Smoking status: Never Smoker  . Smokeless tobacco: Never Used  . Alcohol use 0.0 oz/week  . Drug use: No  . Sexual activity: Not Asked   Other Topics Concern  . None   Social History Narrative  . None    Outpatient Encounter Prescriptions as of 01/03/2016  Medication Sig  . ALPRAZolam (XANAX) 0.25 MG tablet Take 1 tablet (0.25 mg total) by mouth at bedtime as needed for sleep.  Tery Sanfilippo. Docusate Calcium (STOOL SOFTENER PO) Take by mouth.  Tery Sanfilippo. Docusate Sodium (COLACE PO) Take by mouth.  . ferrous sulfate 325 (65 FE) MG tablet Take 325 mg by mouth daily with breakfast.  . Lactobacillus (ACIDOPHILUS PO)  Take by mouth daily.  . metaxalone (SKELAXIN) 800 MG tablet TAKE 1/2 TO 1 TABLET BY MOUTH AT BEDTIME AS NEEDED FOR BACK SPASMS   No facility-administered encounter medications on file as of 01/03/2016.     Review of Systems  Constitutional: Negative for appetite change and unexpected weight change.  HENT: Negative for congestion and sinus pressure.   Respiratory: Negative for cough, chest tightness and shortness of breath.   Cardiovascular: Negative for chest pain, palpitations and leg swelling.  Gastrointestinal: Negative for abdominal pain, diarrhea, nausea and vomiting.  Genitourinary: Negative for difficulty urinating and dysuria.  Musculoskeletal: Negative for back pain and joint swelling.       Neck is stable.    Skin: Negative for color change and rash.  Neurological: Negative for dizziness, light-headedness and headaches.  Psychiatric/Behavioral: Negative for agitation and dysphoric mood.       Objective:    Physical Exam  Constitutional: She appears well-developed and well-nourished. No distress.  HENT:  Nose: Nose normal.  Mouth/Throat: Oropharynx is clear and moist.  Neck: Neck supple. No thyromegaly present.  Cardiovascular: Normal rate and regular  rhythm.   Pulmonary/Chest: Breath sounds normal. No respiratory distress. She has no wheezes.  Abdominal: Soft. Bowel sounds are normal. There is no tenderness.  Genitourinary:  Genitourinary Comments: Normal external genitalia.  Vaginal vault without lesions.  Cervix identified.  Pap smear performed.  Could not appreciate any adnexal masses or tenderness.    Musculoskeletal: She exhibits no edema or tenderness.  Lymphadenopathy:    She has no cervical adenopathy.  Skin: No rash noted. No erythema.  Psychiatric: She has a normal mood and affect. Her behavior is normal.    BP 110/80   Pulse 64   Wt 145 lb (65.8 kg)   LMP 12/24/2015   SpO2 98%   BMI 22.71 kg/m  Wt Readings from Last 3 Encounters:  01/03/16 145 lb  (65.8 kg)  09/28/15 145 lb 12.8 oz (66.1 kg)  06/27/15 142 lb (64.4 kg)     Lab Results  Component Value Date   WBC 3.6 11/02/2015   HGB 12.8 11/02/2015   HCT 38.8 11/02/2015   PLT 198 11/02/2015   GLUCOSE 91 11/02/2015   CHOL 186 11/02/2015   TRIG 40 11/02/2015   HDL 94 11/02/2015   LDLCALC 84 11/02/2015   ALT 15 11/02/2015   AST 18 11/02/2015   NA 135 11/02/2015   K 4.6 11/02/2015   CL 102 11/02/2015   CREATININE 0.67 11/02/2015   BUN 14 11/02/2015   CO2 25 11/02/2015   TSH 1.01 06/27/2015   HGBA1C 5.4 11/02/2015    Mm Screening Breast Tomo Bilateral  Result Date: 11/02/2015 CLINICAL DATA:  Screening. EXAM: 2D DIGITAL SCREENING BILATERAL MAMMOGRAM WITH CAD AND ADJUNCT TOMO COMPARISON:  Previous exam(s). ACR Breast Density Category d: The breast tissue is extremely dense, which lowers the sensitivity of mammography. FINDINGS: There are no findings suspicious for malignancy. Images were processed with CAD. IMPRESSION: No mammographic evidence of malignancy. A result letter of this screening mammogram will be mailed directly to the patient. RECOMMENDATION: Screening mammogram in one year. (Code:SM-B-01Y) BI-RADS CATEGORY  1: Negative. Electronically Signed   By: Frederico HammanMichelle  Collins M.D.   On: 11/02/2015 09:20       Assessment & Plan:   Problem List Items Addressed This Visit    Abnormal Pap smear of cervix    Repeated pap today.        Anemia    Last cbc wnl.  Follow.  On iron supplements.        Barrett's esophagus    EGD 01/05/13 as outlined.  Recommended f/u EGD 12/2017.       History of colonic polyps    Last colonoscopy 2012.  Due f/u 2017.  Needs f/u colonoscopy.        Leukopenia    Has been worked up by hematology.  Last wbc count wnl.  Will follow.        Neck pain    Neck pain better.  Doing well currently.  Follow.        Other Visit Diagnoses    Pap smear abnormality of cervix with ASCUS favoring benign    -  Primary   Relevant Orders    Cytology - PAP       Dale DurhamSCOTT, Wen Munford, MD

## 2016-01-06 ENCOUNTER — Encounter: Payer: Self-pay | Admitting: Internal Medicine

## 2016-01-06 DIAGNOSIS — R87619 Unspecified abnormal cytological findings in specimens from cervix uteri: Secondary | ICD-10-CM | POA: Insufficient documentation

## 2016-01-06 DIAGNOSIS — Z8742 Personal history of other diseases of the female genital tract: Secondary | ICD-10-CM | POA: Insufficient documentation

## 2016-01-06 NOTE — Assessment & Plan Note (Signed)
EGD 01/05/13 as outlined.  Recommended f/u EGD 12/2017.

## 2016-01-06 NOTE — Assessment & Plan Note (Signed)
Repeated pap today.

## 2016-01-06 NOTE — Assessment & Plan Note (Signed)
Neck pain better.  Doing well currently.  Follow.

## 2016-01-06 NOTE — Assessment & Plan Note (Signed)
Last cbc wnl.  Follow.  On iron supplements.

## 2016-01-06 NOTE — Assessment & Plan Note (Signed)
Last colonoscopy 2012.  Due f/u 2017.  Needs f/u colonoscopy.

## 2016-01-06 NOTE — Assessment & Plan Note (Signed)
Has been worked up by hematology.  Last wbc count wnl.  Will follow.

## 2016-01-09 ENCOUNTER — Encounter: Payer: Self-pay | Admitting: Internal Medicine

## 2016-01-09 LAB — CYTOLOGY - PAP
Diagnosis: NEGATIVE
HPV: NOT DETECTED

## 2016-02-25 ENCOUNTER — Encounter: Payer: Self-pay | Admitting: Internal Medicine

## 2016-03-06 ENCOUNTER — Encounter: Payer: Self-pay | Admitting: Internal Medicine

## 2016-03-06 ENCOUNTER — Ambulatory Visit (INDEPENDENT_AMBULATORY_CARE_PROVIDER_SITE_OTHER): Payer: BC Managed Care – PPO

## 2016-03-06 ENCOUNTER — Ambulatory Visit (INDEPENDENT_AMBULATORY_CARE_PROVIDER_SITE_OTHER): Payer: BC Managed Care – PPO | Admitting: Internal Medicine

## 2016-03-06 VITALS — BP 108/68 | HR 99 | Temp 98.7°F | Resp 16 | Ht 67.0 in | Wt 146.2 lb

## 2016-03-06 DIAGNOSIS — D72819 Decreased white blood cell count, unspecified: Secondary | ICD-10-CM

## 2016-03-06 DIAGNOSIS — M542 Cervicalgia: Secondary | ICD-10-CM | POA: Diagnosis not present

## 2016-03-06 DIAGNOSIS — D649 Anemia, unspecified: Secondary | ICD-10-CM | POA: Diagnosis not present

## 2016-03-06 DIAGNOSIS — M546 Pain in thoracic spine: Secondary | ICD-10-CM

## 2016-03-06 DIAGNOSIS — R200 Anesthesia of skin: Secondary | ICD-10-CM | POA: Diagnosis not present

## 2016-03-06 LAB — FERRITIN: Ferritin: 7.2 ng/mL — ABNORMAL LOW (ref 10.0–291.0)

## 2016-03-06 LAB — CBC WITH DIFFERENTIAL/PLATELET
Basophils Absolute: 0.1 10*3/uL (ref 0.0–0.1)
Basophils Relative: 2.5 % (ref 0.0–3.0)
Eosinophils Absolute: 0.1 10*3/uL (ref 0.0–0.7)
Eosinophils Relative: 1.1 % (ref 0.0–5.0)
HCT: 38.1 % (ref 36.0–46.0)
Hemoglobin: 12.6 g/dL (ref 12.0–15.0)
Lymphocytes Relative: 13.6 % (ref 12.0–46.0)
Lymphs Abs: 0.7 10*3/uL (ref 0.7–4.0)
MCHC: 33 g/dL (ref 30.0–36.0)
MCV: 87.2 fl (ref 78.0–100.0)
Monocytes Absolute: 0.5 10*3/uL (ref 0.1–1.0)
Monocytes Relative: 9.5 % (ref 3.0–12.0)
Neutro Abs: 3.6 10*3/uL (ref 1.4–7.7)
Neutrophils Relative %: 73.3 % (ref 43.0–77.0)
Platelets: 309 10*3/uL (ref 150.0–400.0)
RBC: 4.36 Mil/uL (ref 3.87–5.11)
RDW: 14.7 % (ref 11.5–15.5)
WBC: 4.9 10*3/uL (ref 4.0–10.5)

## 2016-03-06 LAB — POCT URINALYSIS DIPSTICK
Bilirubin, UA: NEGATIVE
Glucose, UA: NEGATIVE
Ketones, UA: NEGATIVE
Leukocytes, UA: NEGATIVE
Nitrite, UA: NEGATIVE
Protein, UA: NEGATIVE
Spec Grav, UA: 1.01
Urobilinogen, UA: 0.2
pH, UA: 7

## 2016-03-06 LAB — URINALYSIS, MICROSCOPIC ONLY

## 2016-03-06 LAB — VITAMIN D 25 HYDROXY (VIT D DEFICIENCY, FRACTURES): VITD: 13.55 ng/mL — ABNORMAL LOW (ref 30.00–100.00)

## 2016-03-06 LAB — VITAMIN B12: Vitamin B-12: 422 pg/mL (ref 211–911)

## 2016-03-06 LAB — CK: Total CK: 67 U/L (ref 7–177)

## 2016-03-06 LAB — POCT URINE PREGNANCY: Preg Test, Ur: NEGATIVE

## 2016-03-06 LAB — SEDIMENTATION RATE: Sed Rate: 14 mm/hr (ref 0–20)

## 2016-03-06 MED ORDER — GABAPENTIN 100 MG PO CAPS: 100.0000 mg | ORAL_CAPSULE | Freq: Two times a day (BID) | ORAL | 1 refills | Status: DC | PRN

## 2016-03-06 NOTE — Progress Notes (Signed)
Patient ID: Adrienne ForehandLisa Dameron Phillips, female   DOB: 1970/04/27, 46 y.o.   MRN: [id number]   Subjective:    Patient ID: Adrienne Phillips, female    DOB: 1970/04/27, 46 y.o.   MRN: [id number]  HPI  Patient here as a work in with concerns regarding left side pain.  States symptoms started one week ago. She reports pain in her left lower anterior rib.  Has been taking ibuprofen and skelaxin.  No relief.  Has been doing stretches.  No help.  She has also noticed some discomfort in her left shoulder and left arm.  Some sensation change in her fourth and fifth finger.  No weakness.  She has had issues with her neck previously.  Left lower anterior rib pain - different.  She has been exercising.  No injury or trauma.  No chest pain.  States when she gets angry, her arms will feel numb and her ears will feel hot.  Resolves as soon as she calms down.  She also notices some sensation change in her posterior leg.  Again no weakness.  Does not affect her exercise.     Past Medical History:  Diagnosis Date  . Anemia   . Hypoglycemia   . Iron deficiency   . Migraine   . Ovarian cyst    Past Surgical History:  Procedure Laterality Date  . BREAST BIOPSY     Family History  Problem Relation Age of Onset  . Hypertension Mother     migraines  . Cancer Father     colon and a hx of skin  . Rheum arthritis Brother   . Cancer Paternal Uncle     breast cancer  . Osteoporosis Paternal Grandmother   . Ulcers Paternal Grandfather   . Cancer Maternal Aunt     Breast   Social History   Social History  . Marital status: Married    Spouse name: N/A  . Number of children: 0  . Years of education: N/A   Occupational History  .  [employer]   Social History Main Topics  . Smoking status: Never Smoker  . Smokeless tobacco: Never Used  . Alcohol use 0.0 oz/week  . Drug use: No  . Sexual activity: Not Asked   Other Topics Concern  . None   Social History Narrative  . None    Outpatient Encounter  Prescriptions as of 03/06/2016  Medication Sig  . ALPRAZolam (XANAX) 0.25 MG tablet Take 1 tablet (0.25 mg total) by mouth at bedtime as needed for sleep.  Tery Sanfilippo. Docusate Calcium (STOOL SOFTENER PO) Take by mouth.  Tery Sanfilippo. Docusate Sodium (COLACE PO) Take by mouth.  . ferrous sulfate 325 (65 FE) MG tablet Take 325 mg by mouth daily with breakfast.  . Lactobacillus (ACIDOPHILUS PO) Take by mouth daily.  . magnesium gluconate (MAGONATE) 500 MG tablet Take 500 mg by mouth daily.  . metaxalone (SKELAXIN) 800 MG tablet TAKE 1/2 TO 1 TABLET BY MOUTH AT BEDTIME AS NEEDED FOR BACK SPASMS  . gabapentin (NEURONTIN) 100 MG capsule Take 1 capsule (100 mg total) by mouth 2 (two) times daily as needed.   No facility-administered encounter medications on file as of 03/06/2016.     Review of Systems  Constitutional: Negative for appetite change and unexpected weight change.  HENT: Negative for congestion and sinus pressure.   Respiratory: Negative for cough, chest tightness and shortness of breath.   Cardiovascular: Negative for palpitations and leg swelling.       Left  lower anterior rib pain.    Gastrointestinal: Negative for abdominal pain, diarrhea, nausea and vomiting.  Genitourinary: Negative for difficulty urinating and dysuria.  Musculoskeletal: Positive for back pain. Negative for joint swelling and myalgias.       Shoulder and arm discomfort as outlined.    Skin: Negative for color change and rash.  Neurological: Negative for dizziness, light-headedness and headaches.  Psychiatric/Behavioral: Negative for agitation and dysphoric mood.       Objective:    Physical Exam  Constitutional: She appears well-developed and well-nourished. No distress.  HENT:  Nose: Nose normal.  Mouth/Throat: Oropharynx is clear and moist.  Neck: Neck supple.  Cardiovascular: Normal rate and regular rhythm.   Pulmonary/Chest: Breath sounds normal. No respiratory distress. She has no wheezes.  Abdominal: Soft. Bowel  sounds are normal. There is no tenderness.  Musculoskeletal: She exhibits no edema or tenderness.  Some increased pain - left lower anterior rib.  No breast tenderness.  No CVA tenderness.  No weakness or arms or legs  Lymphadenopathy:    She has no cervical adenopathy.  Skin: No rash noted. No erythema.  Psychiatric: She has a normal mood and affect. Her behavior is normal.    BP 108/68 (BP Location: Left Arm, Patient Position: Sitting, Cuff Size: Large)   Pulse 99   Temp 98.7 F (37.1 C) (Oral)   Resp 16   Ht 5\' 7"  (1.702 m)   Wt 146 lb 3.2 oz (66.3 kg)   SpO2 99%   BMI 22.90 kg/m  Wt Readings from Last 3 Encounters:  03/06/16 146 lb 3.2 oz (66.3 kg)  01/03/16 145 lb (65.8 kg)  09/28/15 145 lb 12.8 oz (66.1 kg)     Lab Results  Component Value Date   WBC 4.9 03/06/2016   HGB 12.6 03/06/2016   HCT 38.1 03/06/2016   PLT 309.0 03/06/2016   GLUCOSE 91 11/02/2015   CHOL 186 11/02/2015   TRIG 40 11/02/2015   HDL 94 11/02/2015   LDLCALC 84 11/02/2015   ALT 15 11/02/2015   AST 18 11/02/2015   NA 135 11/02/2015   K 4.6 11/02/2015   CL 102 11/02/2015   CREATININE 0.67 11/02/2015   BUN 14 11/02/2015   CO2 25 11/02/2015   TSH 1.01 06/27/2015   HGBA1C 5.4 11/02/2015       Assessment & Plan:   Problem List Items Addressed This Visit    Anemia    Recheck cbc as outlined.        Relevant Orders   CBC with Differential/Platelet (Completed)   Ferritin (Completed)   Vitamin B12 (Completed)   Urine Culture (Completed)   Urine Microscopic Only (Completed)   POCT Urinalysis Dipstick (Completed)   Back pain - Primary    Some back pain and pain left lower anterior rib.  Question of radiation of pain.  Check thoracic spine xray.  Trial of gabapentin to see if helps the pain.       Relevant Orders   DG Thoracic Spine 2 View (Completed)   VITAMIN D 25 Hydroxy (Vit-D Deficiency, Fractures) (Completed)   Sedimentation rate (Completed)   CK (Creatine Kinase) (Completed)    POCT urine pregnancy (Completed)   Left arm numbness    Arm discomfort and sensation change as outlined.  Check B12 and routine labs as outlined.  Refer to neurology as outlined.        Leukopenia    Recheck cbc.       Neck pain  Has had neck issues in the past.  Denies any neck stiffness.  Some shoulder and arm discomfort.  May be related to her neck.  She has noticed some sensation change in her fingers as outlined.  Some sensation change in her leg as well.  No weakness.  Discussed further w/up.  Discussed neurology referral.  She agrees.  Will call with name of neurologist she wants to see.            Dale Grassflat, MD

## 2016-03-07 ENCOUNTER — Other Ambulatory Visit: Payer: Self-pay | Admitting: Internal Medicine

## 2016-03-07 ENCOUNTER — Encounter: Payer: Self-pay | Admitting: Internal Medicine

## 2016-03-07 DIAGNOSIS — R200 Anesthesia of skin: Secondary | ICD-10-CM

## 2016-03-07 MED ORDER — VITAMIN D (ERGOCALCIFEROL) 1.25 MG (50000 UNIT) PO CAPS: 50000.0000 [IU] | ORAL_CAPSULE | ORAL | 0 refills | Status: DC

## 2016-03-07 NOTE — Progress Notes (Signed)
rx sent in for ergocalciferol 50000 units #8 with no refills.

## 2016-03-08 LAB — URINE CULTURE: Organism ID, Bacteria: NO GROWTH

## 2016-03-10 ENCOUNTER — Encounter: Payer: Self-pay | Admitting: Internal Medicine

## 2016-03-10 NOTE — Telephone Encounter (Signed)
Order placed for neurology referral.   

## 2016-03-16 ENCOUNTER — Encounter: Payer: Self-pay | Admitting: Internal Medicine

## 2016-03-16 NOTE — Assessment & Plan Note (Addendum)
Some back pain and pain left lower anterior rib.  Question of radiation of pain.  Check thoracic spine xray.  Trial of gabapentin to see if helps the pain.

## 2016-03-16 NOTE — Assessment & Plan Note (Signed)
Has had neck issues in the past.  Denies any neck stiffness.  Some shoulder and arm discomfort.  May be related to her neck.  She has noticed some sensation change in her fingers as outlined.  Some sensation change in her leg as well.  No weakness.  Discussed further w/up.  Discussed neurology referral.  She agrees.  Will call with name of neurologist she wants to see.

## 2016-03-16 NOTE — Assessment & Plan Note (Signed)
Arm discomfort and sensation change as outlined.  Check B12 and routine labs as outlined.  Refer to neurology as outlined.

## 2016-03-16 NOTE — Assessment & Plan Note (Signed)
Recheck cbc as outlined.   

## 2016-03-16 NOTE — Assessment & Plan Note (Signed)
Recheck cbc.  

## 2016-06-10 LAB — HM COLONOSCOPY

## 2016-07-11 ENCOUNTER — Ambulatory Visit: Payer: BC Managed Care – PPO | Admitting: Internal Medicine

## 2016-07-15 ENCOUNTER — Encounter: Payer: Self-pay | Admitting: Internal Medicine

## 2016-07-15 ENCOUNTER — Ambulatory Visit (INDEPENDENT_AMBULATORY_CARE_PROVIDER_SITE_OTHER): Payer: BC Managed Care – PPO | Admitting: Internal Medicine

## 2016-07-15 DIAGNOSIS — D72819 Decreased white blood cell count, unspecified: Secondary | ICD-10-CM

## 2016-07-15 DIAGNOSIS — M542 Cervicalgia: Secondary | ICD-10-CM | POA: Diagnosis not present

## 2016-07-15 DIAGNOSIS — K2270 Barrett's esophagus without dysplasia: Secondary | ICD-10-CM | POA: Diagnosis not present

## 2016-07-15 DIAGNOSIS — M546 Pain in thoracic spine: Secondary | ICD-10-CM | POA: Diagnosis not present

## 2016-07-15 DIAGNOSIS — D649 Anemia, unspecified: Secondary | ICD-10-CM

## 2016-07-15 DIAGNOSIS — F439 Reaction to severe stress, unspecified: Secondary | ICD-10-CM

## 2016-07-15 MED ORDER — ALPRAZOLAM 0.25 MG PO TABS: 0.2500 mg | ORAL_TABLET | Freq: Every evening | ORAL | 0 refills | Status: DC | PRN

## 2016-07-15 MED ORDER — METAXALONE 800 MG PO TABS: ORAL_TABLET | ORAL | 0 refills | Status: DC

## 2016-07-15 NOTE — Progress Notes (Signed)
Pre-visit discussion using our clinic review tool. No additional management support is needed unless otherwise documented below in the visit note.  

## 2016-07-15 NOTE — Progress Notes (Signed)
Patient ID: Adrienne Phillips, female   DOB: [birth year], 46 y.o.   MRN: [id number]   Subjective:    Patient ID: Adrienne Phillips, female    DOB: [birth year], 46 y.o.   MRN: [id number]  HPI  Patient here for a scheduled follow up.  Increased stress with her husband and work.  Discussed with her at length today.  Discussed counseling.  She takes xanax prn.  Rarely takes.  The increased stress is affecting her neck and back. Also increased pain coming around her side.   Increased tension.  She also sits all day at work.  Symptoms are much improved with standing.  When she is able to stand and work, she feels better.  Back and neck better.  Decreases medication she has to take.  Request to have refill on muscle relaxer.  Takes rarely.  No chest pain.  No sob.  She went to a chiropractor.  The side pain resolved with chiropractor treatment.  Is also better with standing.  Request stand up desk.  No abdominal pain.  Bowels moving.     Past Medical History:  Diagnosis Date  . Anemia   . Hypoglycemia   . Iron deficiency   . Migraine   . Ovarian cyst    Past Surgical History:  Procedure Laterality Date  . BREAST BIOPSY     Family History  Problem Relation Age of Onset  . Hypertension Mother        migraines  . Cancer Father        colon and a hx of skin  . Rheum arthritis Brother   . Cancer Paternal Uncle        breast cancer  . Osteoporosis Paternal Grandmother   . Ulcers Paternal Grandfather   . Cancer Maternal Aunt        Breast   Social History   Social History  . Marital status: Married    Spouse name: N/A  . Number of children: 0  . Years of education: N/A   Occupational History  .  [employer]   Social History Main Topics  . Smoking status: Never Smoker  . Smokeless tobacco: Never Used  . Alcohol use 0.0 oz/week  . Drug use: No  . Sexual activity: Not Asked   Other Topics Concern  . None   Social History Narrative  . None    Outpatient Encounter Prescriptions as of  07/15/2016  Medication Sig  . ALPRAZolam (XANAX) 0.25 MG tablet Take 1 tablet (0.25 mg total) by mouth at bedtime as needed for sleep.  . magnesium gluconate (MAGONATE) 500 MG tablet Take 500 mg by mouth daily.  . metaxalone (SKELAXIN) 800 MG tablet TAKE 1/2 TO 1 TABLET BY MOUTH AT BEDTIME AS NEEDED FOR BACK SPASMS  . Vitamin D, Ergocalciferol, (DRISDOL) 50000 units CAPS capsule Take 1 capsule (50,000 Units total) by mouth every 7 (seven) days.  . [DISCONTINUED] ALPRAZolam (XANAX) 0.25 MG tablet Take 1 tablet (0.25 mg total) by mouth at bedtime as needed for sleep.  . [DISCONTINUED] Docusate Calcium (STOOL SOFTENER PO) Take by mouth.  . [DISCONTINUED] Docusate Sodium (COLACE PO) Take by mouth.  . [DISCONTINUED] ferrous sulfate 325 (65 FE) MG tablet Take 325 mg by mouth daily with breakfast.  . [DISCONTINUED] gabapentin (NEURONTIN) 100 MG capsule Take 1 capsule (100 mg total) by mouth 2 (two) times daily as needed.  . [DISCONTINUED] Lactobacillus (ACIDOPHILUS PO) Take by mouth daily.  . [DISCONTINUED] metaxalone (SKELAXIN) 800 MG tablet TAKE 1/2  TO 1 TABLET BY MOUTH AT BEDTIME AS NEEDED FOR BACK SPASMS   No facility-administered encounter medications on file as of 07/15/2016.     Review of Systems  Constitutional: Negative for appetite change and unexpected weight change.  HENT: Negative for congestion and sinus pressure.   Respiratory: Negative for cough, chest tightness and shortness of breath.   Cardiovascular: Negative for chest pain, palpitations and leg swelling.  Gastrointestinal: Negative for abdominal pain, diarrhea, nausea and vomiting.  Genitourinary: Negative for difficulty urinating and dysuria.  Musculoskeletal: Positive for back pain and neck pain. Negative for joint swelling.  Skin: Negative for color change and rash.  Neurological: Negative for dizziness, light-headedness and headaches.  Psychiatric/Behavioral: Negative for agitation and dysphoric mood.       Objective:      Physical Exam  Constitutional: She appears well-developed and well-nourished. No distress.  HENT:  Nose: Nose normal.  Mouth/Throat: Oropharynx is clear and moist.  Neck: Neck supple. No thyromegaly present.  Cardiovascular: Normal rate and regular rhythm.   Pulmonary/Chest: Breath sounds normal. No respiratory distress. She has no wheezes.  Abdominal: Soft. Bowel sounds are normal. There is no tenderness.  Musculoskeletal: She exhibits no edema or tenderness.  Lymphadenopathy:    She has no cervical adenopathy.  Skin: No rash noted. No erythema.  Psychiatric: She has a normal mood and affect. Her behavior is normal.    BP 110/64 (BP Location: Left Arm, Patient Position: Sitting, Cuff Size: Normal)   Pulse 67   Temp 98.6 F (37 C) (Oral)   Resp 12   Ht 5\' 7"  (1.702 m)   Wt 145 lb (65.8 kg)   SpO2 99%   BMI 22.71 kg/m  Wt Readings from Last 3 Encounters:  07/15/16 145 lb (65.8 kg)  03/06/16 146 lb 3.2 oz (66.3 kg)  01/03/16 145 lb (65.8 kg)     Lab Results  Component Value Date   WBC 4.9 03/06/2016   HGB 12.6 03/06/2016   HCT 38.1 03/06/2016   PLT 309.0 03/06/2016   GLUCOSE 91 11/02/2015   CHOL 186 11/02/2015   TRIG 40 11/02/2015   HDL 94 11/02/2015   LDLCALC 84 11/02/2015   ALT 15 11/02/2015   AST 18 11/02/2015   NA 135 11/02/2015   K 4.6 11/02/2015   CL 102 11/02/2015   CREATININE 0.67 11/02/2015   BUN 14 11/02/2015   CO2 25 11/02/2015   TSH 1.01 06/27/2015   HGBA1C 5.4 11/02/2015       Assessment & Plan:   Problem List Items Addressed This Visit    Anemia    Follow cbc.       Back pain    Has had issues with back pain and pain radiating around the side.  Had xrays as outlined.  Saw neurology.  Recommended MRI.  She saw chiropractor.  Pain resolved with treatment.  Flares intermittently.  Worse with standing and stress.  Request stand up desk.  Follow.  Wants to hold on MRI.        Relevant Medications   metaxalone (SKELAXIN) 800 MG tablet    Barrett's esophagus    EGD as outlined.  Recommend f/u EGD 12/2017.        Leukopenia    Has been stable.  Follow cbc.       Neck pain    Worsens with increased tension and stress.  Better with exercise and standing up to work.  Refilled muscle relaxer.  Rarely takes.  Desires no  further intervention.  Follow.        Stress    Increased stress as outlined.  Discussed at length with her today.  Discussed medication and counseling.  Takes prn xanax.  Does not feel needs anything more at this time.  Follow.            Dale DurhamSCOTT, Anaih Brander, MD

## 2016-07-18 ENCOUNTER — Encounter: Payer: Self-pay | Admitting: Internal Medicine

## 2016-07-18 DIAGNOSIS — F439 Reaction to severe stress, unspecified: Secondary | ICD-10-CM | POA: Insufficient documentation

## 2016-07-18 NOTE — Assessment & Plan Note (Signed)
Follow cbc.  

## 2016-07-18 NOTE — Assessment & Plan Note (Signed)
Has had issues with back pain and pain radiating around the side.  Had xrays as outlined.  Saw neurology.  Recommended MRI.  She saw chiropractor.  Pain resolved with treatment.  Flares intermittently.  Worse with standing and stress.  Request stand up desk.  Follow.  Wants to hold on MRI.

## 2016-07-18 NOTE — Assessment & Plan Note (Signed)
EGD as outlined.  Recommend f/u EGD 12/2017.

## 2016-07-18 NOTE — Assessment & Plan Note (Signed)
Worsens with increased tension and stress.  Better with exercise and standing up to work.  Refilled muscle relaxer.  Rarely takes.  Desires no further intervention.  Follow.

## 2016-07-18 NOTE — Assessment & Plan Note (Signed)
Increased stress as outlined.  Discussed at length with her today.  Discussed medication and counseling.  Takes prn xanax.  Does not feel needs anything more at this time.  Follow.

## 2016-07-18 NOTE — Assessment & Plan Note (Signed)
Has been stable. Follow cbc.  

## 2016-10-10 ENCOUNTER — Ambulatory Visit (INDEPENDENT_AMBULATORY_CARE_PROVIDER_SITE_OTHER): Payer: BC Managed Care – PPO | Admitting: Family Medicine

## 2016-10-10 ENCOUNTER — Encounter: Payer: Self-pay | Admitting: Family Medicine

## 2016-10-10 DIAGNOSIS — J0100 Acute maxillary sinusitis, unspecified: Secondary | ICD-10-CM

## 2016-10-10 DIAGNOSIS — J329 Chronic sinusitis, unspecified: Secondary | ICD-10-CM | POA: Insufficient documentation

## 2016-10-10 MED ORDER — AMOXICILLIN-POT CLAVULANATE 875-125 MG PO TABS: 1.0000 | ORAL_TABLET | Freq: Two times a day (BID) | ORAL | 0 refills | Status: DC

## 2016-10-10 NOTE — Patient Instructions (Signed)
Meet you. You likely have a sinus infection. We will treat with Augmentin. If your symptoms are not improving over the next 4-5 days please let us know.

## 2016-10-10 NOTE — Assessment & Plan Note (Addendum)
New issue. Consistent with right maxillary sinusitis. No signs of ear infection. Discussed treatment with Augmentin. Advised she could use pseudoephedrine or Flonase to help with congestion. She will call in 4-5 days if not improving. Advised on probiotic with the antibiotic.

## 2016-10-10 NOTE — Progress Notes (Signed)
  Marikay Alar, MD Phone: [phone]  Adrienne Phillips is a 46 y.o. female who presents today for same-day visit.  Patient notes 3 weeks of this for a congestion and then 4-5 days ago developed right ear pain. Notes still having right maxillary sinus congestion and pressure that is not improving. Blowing clear mucus out of her nose. No cough. No fevers. Does no sick contact and her husband. No history of hypertension or cardiac issues.   PMH: nonsmoker.   ROS see history of present illness  Objective  Physical Exam Vitals:   10/10/16 0807  BP: 100/68  Pulse: 75  Temp: 98.2 F (36.8 C)  SpO2: 99%    BP Readings from Last 3 Encounters:  10/10/16 100/68  07/15/16 110/64  03/06/16 108/68   Wt Readings from Last 3 Encounters:  10/10/16 143 lb 3.2 oz (65 kg)  07/15/16 145 lb (65.8 kg)  03/06/16 146 lb 3.2 oz (66.3 kg)    Physical Exam  Constitutional: No distress.  HENT:  Head: Normocephalic and atraumatic.  Mouth/Throat: Oropharynx is clear and moist. No oropharyngeal exudate.  Normal TMs bilaterally  Eyes: Pupils are equal, round, and reactive to light. Conjunctivae are normal.  Neck: Neck supple.  Cardiovascular: Normal rate, regular rhythm and normal heart sounds.   Pulmonary/Chest: Effort normal and breath sounds normal.  Lymphadenopathy:    She has no cervical adenopathy.  Neurological: She is alert. Gait normal.  Skin: Skin is warm and dry. She is not diaphoretic.     Assessment/Plan: Please see individual problem list.  Sinusitis New issue. Consistent with right maxillary sinusitis. No signs of ear infection. Discussed treatment with Augmentin. Advised she could use pseudoephedrine or Flonase to help with congestion. She will call in 4-5 days if not improving. Advised on probiotic with the antibiotic.   No orders of the defined types were placed in this encounter.   Meds ordered this encounter  Medications  . amoxicillin-clavulanate (AUGMENTIN)  875-125 MG tablet    Sig: Take 1 tablet by mouth 2 (two) times daily.    Dispense:  14 tablet    Refill:  0    Marikay Alar, MD [facility]

## 2016-10-16 ENCOUNTER — Encounter: Payer: Self-pay | Admitting: Family Medicine

## 2016-10-17 ENCOUNTER — Other Ambulatory Visit: Payer: Self-pay | Admitting: Family Medicine

## 2016-10-24 ENCOUNTER — Encounter: Payer: Self-pay | Admitting: Internal Medicine

## 2016-10-24 ENCOUNTER — Ambulatory Visit (INDEPENDENT_AMBULATORY_CARE_PROVIDER_SITE_OTHER): Payer: BC Managed Care – PPO | Admitting: Internal Medicine

## 2016-10-24 VITALS — BP 100/64 | HR 74 | Temp 97.9°F | Resp 12 | Ht 67.0 in | Wt 142.4 lb

## 2016-10-24 DIAGNOSIS — Z1231 Encounter for screening mammogram for malignant neoplasm of breast: Secondary | ICD-10-CM

## 2016-10-24 DIAGNOSIS — D649 Anemia, unspecified: Secondary | ICD-10-CM

## 2016-10-24 DIAGNOSIS — D72819 Decreased white blood cell count, unspecified: Secondary | ICD-10-CM

## 2016-10-24 DIAGNOSIS — Z0000 Encounter for general adult medical examination without abnormal findings: Secondary | ICD-10-CM | POA: Diagnosis not present

## 2016-10-24 DIAGNOSIS — N926 Irregular menstruation, unspecified: Secondary | ICD-10-CM | POA: Diagnosis not present

## 2016-10-24 DIAGNOSIS — J069 Acute upper respiratory infection, unspecified: Secondary | ICD-10-CM | POA: Diagnosis not present

## 2016-10-24 DIAGNOSIS — E559 Vitamin D deficiency, unspecified: Secondary | ICD-10-CM | POA: Diagnosis not present

## 2016-10-24 DIAGNOSIS — Z1239 Encounter for other screening for malignant neoplasm of breast: Secondary | ICD-10-CM

## 2016-10-24 DIAGNOSIS — K2270 Barrett's esophagus without dysplasia: Secondary | ICD-10-CM | POA: Diagnosis not present

## 2016-10-24 DIAGNOSIS — F439 Reaction to severe stress, unspecified: Secondary | ICD-10-CM | POA: Diagnosis not present

## 2016-10-24 DIAGNOSIS — M542 Cervicalgia: Secondary | ICD-10-CM | POA: Diagnosis not present

## 2016-10-24 LAB — CBC WITH DIFFERENTIAL/PLATELET
Basophils Absolute: 0.1 10*3/uL (ref 0.0–0.1)
Basophils Relative: 1.2 % (ref 0.0–3.0)
Eosinophils Absolute: 0 10*3/uL (ref 0.0–0.7)
Eosinophils Relative: 0.8 % (ref 0.0–5.0)
HCT: 33.6 % — ABNORMAL LOW (ref 36.0–46.0)
Hemoglobin: 11.1 g/dL — ABNORMAL LOW (ref 12.0–15.0)
Lymphocytes Relative: 15.3 % (ref 12.0–46.0)
Lymphs Abs: 0.9 10*3/uL (ref 0.7–4.0)
MCHC: 33 g/dL (ref 30.0–36.0)
MCV: 87.5 fl (ref 78.0–100.0)
Monocytes Absolute: 0.5 10*3/uL (ref 0.1–1.0)
Monocytes Relative: 9.1 % (ref 3.0–12.0)
Neutro Abs: 4.1 10*3/uL (ref 1.4–7.7)
Neutrophils Relative %: 73.6 % (ref 43.0–77.0)
Platelets: 236 10*3/uL (ref 150.0–400.0)
RBC: 3.84 Mil/uL — ABNORMAL LOW (ref 3.87–5.11)
RDW: 14.3 % (ref 11.5–15.5)
WBC: 5.6 10*3/uL (ref 4.0–10.5)

## 2016-10-24 LAB — COMPREHENSIVE METABOLIC PANEL
ALT: 13 U/L (ref 0–35)
AST: 14 U/L (ref 0–37)
Albumin: 4 g/dL (ref 3.5–5.2)
Alkaline Phosphatase: 54 U/L (ref 39–117)
BUN: 8 mg/dL (ref 6–23)
CO2: 29 mEq/L (ref 19–32)
Calcium: 8.8 mg/dL (ref 8.4–10.5)
Chloride: 101 mEq/L (ref 96–112)
Creatinine, Ser: 0.65 mg/dL (ref 0.40–1.20)
GFR: 103.79 mL/min (ref >60.00)
Glucose, Bld: 98 mg/dL (ref 70–99)
Potassium: 4.5 mEq/L (ref 3.5–5.1)
Sodium: 136 mEq/L (ref 135–145)
Total Bilirubin: 0.3 mg/dL (ref 0.2–1.2)
Total Protein: 6.4 g/dL (ref 6.0–8.3)

## 2016-10-24 LAB — VITAMIN D 25 HYDROXY (VIT D DEFICIENCY, FRACTURES): VITD: 20.96 ng/mL — ABNORMAL LOW (ref 30.00–100.00)

## 2016-10-24 LAB — FOLLICLE STIMULATING HORMONE: FSH: 11.5 m[IU]/mL

## 2016-10-24 LAB — FERRITIN: Ferritin: 6.2 ng/mL — ABNORMAL LOW (ref 10.0–291.0)

## 2016-10-24 LAB — VITAMIN B12: Vitamin B-12: 399 pg/mL (ref 211–911)

## 2016-10-24 LAB — TSH: TSH: 0.66 u[IU]/mL (ref 0.35–4.50)

## 2016-10-24 MED ORDER — AMOXICILLIN-POT CLAVULANATE 875-125 MG PO TABS: 1.0000 | ORAL_TABLET | Freq: Two times a day (BID) | ORAL | 0 refills | Status: DC

## 2016-10-24 MED ORDER — PREDNISONE 10 MG PO TABS: ORAL_TABLET | ORAL | 0 refills | Status: DC

## 2016-10-24 NOTE — Assessment & Plan Note (Addendum)
Physical today 10/24/16.  PAP 06/27/15 - negative with negative HPV.  Mammogram 11/02/15 - Birads I.  Schedule f/u mammogram.

## 2016-10-24 NOTE — Patient Instructions (Signed)
Saline nasal spray - flush nose at least 2-3x/day  Continue flonase nasal spry - 2 sprays each nostril one time per day.  Do in the evening.    Robitussin DM twice a day as needed.

## 2016-10-24 NOTE — Progress Notes (Signed)
Patient ID: Adrienne Phillips, female   DOB: 02-17-1970, 46 y.o.   MRN: [id number]   Subjective:    Patient ID: Adrienne Phillips, female    DOB: Jun 24, 1970, 46 y.o.   MRN: [id number]  HPI  Patient here for her physical exam.  She reports she is doing relatively well.  Work is better.  Family issues better.  Overall she feels she is handling things relatively well.  No chest pain.  Neck and back overall relatively stable.  Two weeks ago started having symptoms of increased cough and congestion.  Ears popping.  Productive cough.  Increased drainage.  No sob.  Taking delsym.  No acid reflux.  No abdominal pain.  Bowels moving.  Had one week of abx.  Was some better.  Now with increasing symptoms as outlined.  She desires to have vitamin levels checked.     Past Medical History:  Diagnosis Date  . Anemia   . Hypoglycemia   . Iron deficiency   . Migraine   . Ovarian cyst    Past Surgical History:  Procedure Laterality Date  . BREAST BIOPSY     Family History  Problem Relation Age of Onset  . Hypertension Mother        migraines  . Cancer Father        colon and a hx of skin  . Rheum arthritis Brother   . Cancer Paternal Uncle        breast cancer  . Osteoporosis Paternal Grandmother   . Ulcers Paternal Grandfather   . Cancer Maternal Aunt        Breast   Social History   Social History  . Marital status: Married    Spouse name: N/A  . Number of children: 0  . Years of education: N/A   Occupational History  .  [employer]   Social History Main Topics  . Smoking status: Never Smoker  . Smokeless tobacco: Never Used  . Alcohol use 0.0 oz/week  . Drug use: No  . Sexual activity: Not Asked   Other Topics Concern  . None   Social History Narrative  . None    Outpatient Encounter Prescriptions as of 10/24/2016  Medication Sig  . ALPRAZolam (XANAX) 0.25 MG tablet Take 1 tablet (0.25 mg total) by mouth at bedtime as needed for sleep.  . magnesium gluconate (MAGONATE) 500 MG  tablet Take 500 mg by mouth daily.  . metaxalone (SKELAXIN) 800 MG tablet TAKE 1/2 TO 1 TABLET BY MOUTH AT BEDTIME AS NEEDED FOR BACK SPASMS  . Vitamin D, Ergocalciferol, (DRISDOL) 50000 units CAPS capsule Take 1 capsule (50,000 Units total) by mouth every 7 (seven) days.  Marland Kitchen amoxicillin-clavulanate (AUGMENTIN) 875-125 MG tablet Take 1 tablet by mouth 2 (two) times daily.  . predniSONE (DELTASONE) 10 MG tablet Take 6 tablets x 1 day then decrease by 1/2 tablet per day until down to zero mg.  . [DISCONTINUED] amoxicillin-clavulanate (AUGMENTIN) 875-125 MG tablet Take 1 tablet by mouth 2 (two) times daily.   No facility-administered encounter medications on file as of 10/24/2016.     Review of Systems  Constitutional: Negative for appetite change, fever and unexpected weight change.  HENT: Positive for congestion and postnasal drip. Negative for sore throat.        Ears popping.   Eyes: Negative for pain and visual disturbance.  Respiratory: Positive for cough. Negative for chest tightness and shortness of breath.   Cardiovascular: Negative for chest pain, palpitations and leg  swelling.  Gastrointestinal: Negative for abdominal pain, diarrhea, nausea and vomiting.  Genitourinary: Negative for difficulty urinating and dysuria.  Musculoskeletal: Negative for back pain and joint swelling.  Skin: Negative for color change and rash.  Neurological: Negative for dizziness, light-headedness and headaches.  Hematological: Negative for adenopathy. Does not bruise/bleed easily.  Psychiatric/Behavioral: Negative for agitation and dysphoric mood.       Objective:    Physical Exam  Constitutional: She is oriented to person, place, and time. She appears well-developed and well-nourished. No distress.  HENT:  Nose: Nose normal.  Mouth/Throat: Oropharynx is clear and moist.  Eyes: Right eye exhibits no discharge. Left eye exhibits no discharge. No scleral icterus.  Neck: Neck supple. No thyromegaly  present.  Cardiovascular: Normal rate and regular rhythm.   Pulmonary/Chest: Breath sounds normal. No accessory muscle usage. No tachypnea. No respiratory distress. She has no decreased breath sounds. She has no wheezes. She has no rhonchi. Right breast exhibits no inverted nipple, no mass, no nipple discharge and no tenderness (no axillary adenopathy). Left breast exhibits no inverted nipple, no mass, no nipple discharge and no tenderness (no axilarry adenopathy).  Increased cough with forced expiration.   Abdominal: Soft. Bowel sounds are normal. There is no tenderness.  Musculoskeletal: She exhibits no edema or tenderness.  Lymphadenopathy:    She has no cervical adenopathy.  Neurological: She is alert and oriented to person, place, and time.  Skin: Skin is warm. No rash noted. No erythema.  Psychiatric: She has a normal mood and affect. Her behavior is normal.    BP 100/64 (BP Location: Left Arm, Patient Position: Sitting, Cuff Size: Normal)   Pulse 74   Temp 97.9 F (36.6 C) (Oral)   Resp 12   Ht  (1.702 m)   Wt 142 lb 6.4 oz (64.6 kg)   SpO2 98%   BMI 22.30 kg/m  Wt Readings from Last 3 Encounters:  10/24/16 142 lb 6.4 oz (64.6 kg)  10/10/16 143 lb 3.2 oz (65 kg)  07/15/16 145 lb (65.8 kg)     Lab Results  Component Value Date   WBC 5.6 10/24/2016   HGB 11.1 (L) 10/24/2016   HCT 33.6 (L) 10/24/2016   PLT 236.0 10/24/2016   GLUCOSE 98 10/24/2016   CHOL 186 11/02/2015   TRIG 40 11/02/2015   HDL 94 11/02/2015   LDLCALC 84 11/02/2015   ALT 13 10/24/2016   AST 14 10/24/2016   NA 136 10/24/2016   K 4.5 10/24/2016   CL 101 10/24/2016   CREATININE 0.65 10/24/2016   BUN 8 10/24/2016   CO2 29 10/24/2016   TSH 0.66 10/24/2016   HGBA1C 5.4 11/02/2015       Assessment & Plan:   Problem List Items Addressed This Visit    Anemia    Follow [facility].       Relevant Orders   TSH (Completed)   Comprehensive metabolic panel (Completed)   Ferritin (Completed)    Vitamin B12 (Completed)   Barrett's esophagus    EGD as outlined 01/05/13.  Recommended f/u EGD in 12/2017.        Health care maintenance    Physical today 10/24/16.  PAP 06/27/15 - negative with negative HPV.  Mammogram 11/02/15 - Bir[facility] I.  Schedule f/u mammogram.       Leukopenia    Follow cbc.       Relevant Orders   CBC with Differential/Platelet (Completed)   Neck pain    Stable.  Better.  Follow.       Stress    History of increased stress.  Is better.  Does not feel she needs anything more at this time.  Follow.         Other Visit Diagnoses    Screening for breast cancer    -  Primary   Relevant Orders   MM DIGITAL SCREENING BILATERAL   Vitamin D deficiency       Relevant Orders   VITAMIN D 25 Hydroxy (Vit-D Deficiency, Fractures) (Completed)   Menstrual changes       Relevant Orders   FSH (Completed)   Upper respiratory tract infection, unspecified type       Increased cough and congestion.  Extend augmentin.  Prednisone taper as directed.  Saline nasal spray and steroid nasal spray as directed.  Robitussin DM.        Dale Kokhanok, MD

## 2016-10-26 ENCOUNTER — Other Ambulatory Visit: Payer: Self-pay | Admitting: Internal Medicine

## 2016-10-26 DIAGNOSIS — D649 Anemia, unspecified: Secondary | ICD-10-CM

## 2016-10-26 NOTE — Progress Notes (Signed)
Order placed for f/u labs.  

## 2016-10-27 ENCOUNTER — Encounter: Payer: Self-pay | Admitting: Internal Medicine

## 2016-10-27 NOTE — Assessment & Plan Note (Signed)
Stable.  Better.  Follow.

## 2016-10-27 NOTE — Assessment & Plan Note (Signed)
History of increased stress.  Is better.  Does not feel she needs anything more at this time.  Follow.

## 2016-10-27 NOTE — Assessment & Plan Note (Signed)
EGD as outlined 01/05/13.  Recommended f/u EGD in 12/2017.

## 2016-10-27 NOTE — Assessment & Plan Note (Signed)
Follow cbc.  

## 2016-11-05 ENCOUNTER — Ambulatory Visit: Payer: BC Managed Care – PPO

## 2016-11-28 ENCOUNTER — Other Ambulatory Visit (INDEPENDENT_AMBULATORY_CARE_PROVIDER_SITE_OTHER): Payer: BC Managed Care – PPO

## 2016-11-28 ENCOUNTER — Ambulatory Visit
Admission: RE | Admit: 2016-11-28 | Discharge: 2016-11-28 | Disposition: A | Discharge status: Home / Self Care | Payer: BC Managed Care – PPO | Source: Ambulatory Visit | Attending: Internal Medicine | Admitting: Internal Medicine

## 2016-11-28 ENCOUNTER — Ambulatory Visit (INDEPENDENT_AMBULATORY_CARE_PROVIDER_SITE_OTHER): Payer: BC Managed Care – PPO

## 2016-11-28 DIAGNOSIS — Z1239 Encounter for other screening for malignant neoplasm of breast: Secondary | ICD-10-CM

## 2016-11-28 DIAGNOSIS — D649 Anemia, unspecified: Secondary | ICD-10-CM

## 2016-11-28 DIAGNOSIS — Z23 Encounter for immunization: Secondary | ICD-10-CM

## 2016-11-28 DIAGNOSIS — Z1231 Encounter for screening mammogram for malignant neoplasm of breast: Secondary | ICD-10-CM | POA: Insufficient documentation

## 2016-11-28 LAB — CBC WITH DIFFERENTIAL/PLATELET
Basophils Absolute: 0.1 10*3/uL (ref 0.0–0.1)
Basophils Relative: 1.9 % (ref 0.0–3.0)
Eosinophils Absolute: 0.1 10*3/uL (ref 0.0–0.7)
Eosinophils Relative: 1.9 % (ref 0.0–5.0)
HCT: 36.5 % (ref 36.0–46.0)
Hemoglobin: 11.7 g/dL — ABNORMAL LOW (ref 12.0–15.0)
Lymphocytes Relative: 25.3 % (ref 12.0–46.0)
Lymphs Abs: 1.1 10*3/uL (ref 0.7–4.0)
MCHC: 32.1 g/dL (ref 30.0–36.0)
MCV: 88.2 fl (ref 78.0–100.0)
Monocytes Absolute: 0.6 10*3/uL (ref 0.1–1.0)
Monocytes Relative: 15 % — ABNORMAL HIGH (ref 3.0–12.0)
Neutro Abs: 2.4 10*3/uL (ref 1.4–7.7)
Neutrophils Relative %: 55.9 % (ref 43.0–77.0)
Platelets: 278 10*3/uL (ref 150.0–400.0)
RBC: 4.13 Mil/uL (ref 3.87–5.11)
RDW: 15.2 % (ref 11.5–15.5)
WBC: 4.2 10*3/uL (ref 4.0–10.5)

## 2016-11-28 LAB — FERRITIN: Ferritin: 8.9 ng/mL — ABNORMAL LOW (ref 10.0–291.0)

## 2016-12-01 ENCOUNTER — Other Ambulatory Visit: Payer: Self-pay | Admitting: Internal Medicine

## 2016-12-01 DIAGNOSIS — D649 Anemia, unspecified: Secondary | ICD-10-CM

## 2016-12-01 NOTE — Progress Notes (Signed)
Orders placed for f/u labs.  

## 2016-12-17 ENCOUNTER — Encounter: Payer: Self-pay | Admitting: Internal Medicine

## 2016-12-17 NOTE — Telephone Encounter (Signed)
I can see her tomorrow at 11:30 for work in for this and then we can see if needs gyn referral.  Please call her and see if she can come in at this time.

## 2016-12-18 NOTE — Telephone Encounter (Signed)
Called pt made appt for 11/30 @9am 

## 2016-12-19 ENCOUNTER — Ambulatory Visit: Payer: BC Managed Care – PPO | Admitting: Internal Medicine

## 2016-12-19 ENCOUNTER — Encounter: Payer: Self-pay | Admitting: Internal Medicine

## 2016-12-19 DIAGNOSIS — N898 Other specified noninflammatory disorders of vagina: Secondary | ICD-10-CM | POA: Diagnosis not present

## 2016-12-19 NOTE — Progress Notes (Signed)
Pre visit review using our clinic review tool, if applicable. No additional management support is needed unless otherwise documented below in the visit note. 

## 2016-12-19 NOTE — Progress Notes (Signed)
Patient ID: Adrienne ForehandLisa Dameron Phillips, female   DOB: 1970-11-14, 46 y.o.   MRN: [id number]   Subjective:    Patient ID: Adrienne Phillips, female    DOB: 1970-11-14, 46 y.o.   MRN: [id number]  HPI  Patient here as a work in with concerns regarding a vaginal lesion.  Noticed a raised area (size of a lima bean) - on her vagina.  Minimal tenderness.  No drainage.  States noticed a itch sensation.  No significant pain.  No discharge.  No redness.  No intravaginal problems.  After her period, noticed a decrease in size.  Now is better.  Has been taking baths with epsom salts.  Again, area is smaller.  No fever.  No rash.     Past Medical History:  Diagnosis Date  . Anemia   . Hypoglycemia   . Iron deficiency   . Migraine   . Ovarian cyst    Past Surgical History:  Procedure Laterality Date  . BREAST BIOPSY Left 1998   cyst   Family History  Problem Relation Age of Onset  . Hypertension Mother        migraines  . Cancer Father        colon and a hx of skin  . Rheum arthritis Brother   . Osteoporosis Paternal Grandmother   . Ulcers Paternal Grandfather   . Breast cancer Maternal Aunt 70  . Breast cancer Paternal Aunt    Social History   Socioeconomic History  . Marital status: Married    Spouse name: None  . Number of children: 0  . Years of education: None  . Highest education level: None  Social Needs  . Financial resource strain: None  . Food insecurity - worry: None  . Food insecurity - inability: None  . Transportation needs - medical: None  . Transportation needs - non-medical: None  Occupational History    Employer: [employer]  Tobacco Use  . Smoking status: Never Smoker  . Smokeless tobacco: Never Used  Substance and Sexual Activity  . Alcohol use: Yes    Alcohol/week: 0.0 oz  . Drug use: No  . Sexual activity: None  Other Topics Concern  . None  Social History Narrative  . None    Outpatient Encounter Medications as of 12/19/2016  Medication Sig  . ALPRAZolam  (XANAX) 0.25 MG tablet Take 1 tablet (0.25 mg total) by mouth at bedtime as needed for sleep.  Marland Kitchen. amoxicillin-clavulanate (AUGMENTIN) 875-125 MG tablet Take 1 tablet by mouth 2 (two) times daily.  . cyanocobalamin 2000 MCG tablet Take 2,000 mcg by mouth daily.  . ferrous sulfate 325 (65 FE) MG EC tablet Take 325 mg by mouth 3 (three) times daily with meals.  . magnesium gluconate (MAGONATE) 500 MG tablet Take 500 mg by mouth daily.  . metaxalone (SKELAXIN) 800 MG tablet TAKE 1/2 TO 1 TABLET BY MOUTH AT BEDTIME AS NEEDED FOR BACK SPASMS  . predniSONE (DELTASONE) 10 MG tablet Take 6 tablets x 1 day then decrease by 1/2 tablet per day until down to zero mg.  . Vitamin D, Ergocalciferol, (DRISDOL) 50000 units CAPS capsule Take 1 capsule (50,000 Units total) by mouth every 7 (seven) days.   No facility-administered encounter medications on file as of 12/19/2016.     Review of Systems  Constitutional: Negative for appetite change and unexpected weight change.  Respiratory: Negative for cough and shortness of breath.   Cardiovascular: Negative for leg swelling.  Gastrointestinal: Negative for abdominal pain, diarrhea,  nausea and vomiting.  Genitourinary: Negative for difficulty urinating, dysuria and vaginal discharge.  Musculoskeletal:       No increased back pain.    Skin: Negative for color change and rash.       Objective:    Physical Exam  Constitutional: She appears well-developed and well-nourished. No distress.  Neck: Neck supple.  Cardiovascular: Normal rate and regular rhythm.  Pulmonary/Chest: Breath sounds normal. No respiratory distress. She has no wheezes.  Abdominal: Soft. Bowel sounds are normal. There is no tenderness.  Genitourinary:  Genitourinary Comments: Small palpable lesion (2mm) - non tender.  No erythema.   Lymphadenopathy:    She has no cervical adenopathy.    BP 100/70   Pulse 72   Temp (!) 97.5 F (36.4 C) (Oral)   Ht 5\' 7"  (1.702 m)   Wt 144 lb 9.6 oz  (65.6 kg)   SpO2 99%   BMI 22.65 kg/m  Wt Readings from Last 3 Encounters:  12/19/16 144 lb 9.6 oz (65.6 kg)  10/24/16 142 lb 6.4 oz (64.6 kg)  10/10/16 143 lb 3.2 oz (65 kg)     Lab Results  Component Value Date   WBC 4.2 11/28/2016   HGB 11.7 (L) 11/28/2016   HCT 36.5 11/28/2016   PLT 278.0 11/28/2016   GLUCOSE 98 10/24/2016   CHOL 186 11/02/2015   TRIG 40 11/02/2015   HDL 94 11/02/2015   LDLCALC 84 11/02/2015   ALT 13 10/24/2016   AST 14 10/24/2016   NA 136 10/24/2016   K 4.5 10/24/2016   CL 101 10/24/2016   CREATININE 0.65 10/24/2016   BUN 8 10/24/2016   CO2 29 10/24/2016   TSH 0.66 10/24/2016   HGBA1C 5.4 11/02/2015    Mm Screening Breast Tomo Bilateral  Result Date: 11/28/2016 CLINICAL DATA:  Screening. EXAM: 2D DIGITAL SCREENING BILATERAL MAMMOGRAM WITH CAD AND ADJUNCT TOMO COMPARISON:  Previous exam(s). ACR Breast Density Category d: The breast tissue is extremely dense, which lowers the sensitivity of mammography. FINDINGS: There are no findings suspicious for malignancy. Images were processed with CAD. IMPRESSION: No mammographic evidence of malignancy. A result letter of this screening mammogram will be mailed directly to the patient. RECOMMENDATION: Screening mammogram in one year. (Code:SM-B-01Y) BI-RADS CATEGORY  1: Negative. Electronically Signed   By: Frederico HammanMichelle  Collins M.D.   On: 11/28/2016 15:30       Assessment & Plan:   Problem List Items Addressed This Visit    Vaginal lesion    Improved.  Continue epsom salts.  Warm compresses.  Notify me if does not completely resolve.            Dale DurhamSCOTT, Myriah Boggus, MD

## 2016-12-21 ENCOUNTER — Encounter: Payer: Self-pay | Admitting: Internal Medicine

## 2016-12-21 DIAGNOSIS — N898 Other specified noninflammatory disorders of vagina: Secondary | ICD-10-CM | POA: Insufficient documentation

## 2016-12-21 NOTE — Assessment & Plan Note (Signed)
Improved.  Continue epsom salts.  Warm compresses.  Notify me if does not completely resolve.

## 2017-02-06 ENCOUNTER — Other Ambulatory Visit (INDEPENDENT_AMBULATORY_CARE_PROVIDER_SITE_OTHER): Payer: BC Managed Care – PPO

## 2017-02-06 DIAGNOSIS — D649 Anemia, unspecified: Secondary | ICD-10-CM

## 2017-02-06 LAB — CBC WITH DIFFERENTIAL/PLATELET
Basophils Absolute: 0.1 10*3/uL (ref 0.0–0.1)
Basophils Relative: 1.4 % (ref 0.0–3.0)
Eosinophils Absolute: 0.1 10*3/uL (ref 0.0–0.7)
Eosinophils Relative: 1.2 % (ref 0.0–5.0)
HCT: 37 % (ref 36.0–46.0)
Hemoglobin: 12.3 g/dL (ref 12.0–15.0)
Lymphocytes Relative: 21.5 % (ref 12.0–46.0)
Lymphs Abs: 1.1 10*3/uL (ref 0.7–4.0)
MCHC: 33.2 g/dL (ref 30.0–36.0)
MCV: 86.8 fl (ref 78.0–100.0)
Monocytes Absolute: 0.4 10*3/uL (ref 0.1–1.0)
Monocytes Relative: 8.5 % (ref 3.0–12.0)
Neutro Abs: 3.5 10*3/uL (ref 1.4–7.7)
Neutrophils Relative %: 67.4 % (ref 43.0–77.0)
Platelets: 277 10*3/uL (ref 150.0–400.0)
RBC: 4.26 Mil/uL (ref 3.87–5.11)
RDW: 15 % (ref 11.5–15.5)
WBC: 5.2 10*3/uL (ref 4.0–10.5)

## 2017-02-06 LAB — FERRITIN: Ferritin: 11.8 ng/mL (ref 10.0–291.0)

## 2017-02-09 ENCOUNTER — Encounter: Payer: Self-pay | Admitting: Internal Medicine

## 2017-03-31 ENCOUNTER — Encounter: Payer: Self-pay | Admitting: Internal Medicine

## 2017-04-02 ENCOUNTER — Telehealth: Payer: Self-pay | Admitting: *Deleted

## 2017-04-02 ENCOUNTER — Telehealth: Payer: Self-pay | Admitting: Internal Medicine

## 2017-04-02 NOTE — Telephone Encounter (Signed)
She has a history of neck issues which could explain some of her arm symptoms, but if she had an episode of facial drooping that resolved quickly, she needs to be seen.

## 2017-04-02 NOTE — Telephone Encounter (Signed)
Patient stated this happen last Thursday and she shouldn't have waited so long it took to send you a message but she has had no symptoms since and she wants to wait and see PCP next Thursday she believes this was anxiety induced , if she has anymore symptoms she will go straight to ED. 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

## 2017-04-02 NOTE — Telephone Encounter (Signed)
Noted.  Appreciate.  Please f/u if you do not mind.  She is probably at work.

## 2017-04-02 NOTE — Telephone Encounter (Signed)
Left messages on home and mobile number to call office. FYI

## 2017-04-02 NOTE — Telephone Encounter (Signed)
Left message for patient to return cal  To office see PCP message.

## 2017-04-02 NOTE — Telephone Encounter (Signed)
Patient sent MY chart message with symptoms called to clarify with patient no received voicemail left message to call office , [facility]. COPY of my chart message pasted below.     ----- Message from Smigelski, Janeann ForehandLisa Dameron to Dale DurhamScott, Charlene, MD sent at 03/31/2017 5:04 PM -----   Hi Dr. Lorin PicketScott,    I have been have a few issues over the last few days. Last Thursday I had, what I believe was bell's palsy on the right side of my face. I took a aspirin and it went away. No issues since. More recently, I have had tingling in my left and right arm. I am sure these symptoms are coming from long hours at my desk. I will take a Skelaxin when I get home to see if this remedies the issue and let you know. I will admit that I have been under a lot of stress with school and work and feel these issues are related to that. I will say that I have been working out and have had no issues of weaknesses or shortness of breathe. I have also been taking my iron and b12 meds.   I'm not sure if I need to come in or just monitor and let you know. Please advise when you have a moment.     Thanks,

## 2017-04-02 NOTE — Telephone Encounter (Signed)
Talked with Adrienne Phillips with [facility] have patient scheduled for next Thursday at 12 with PCP in meantime advised patient symptoms come back she needs to be evaluated immediately [facility] nurse stated she would advise patient.

## 2017-04-02 NOTE — Telephone Encounter (Signed)
Pt called back in response to VM. Pt reports facial droop has not reoccurred, "was really just my eye and eyebrow, but was quite noticeable." Pt informed she needed to be evaluated. Spoke with Olegario MessierKathy in office as unable to secure appt.. Appt made by Olegario MessierKathy for 04/09/17 at 1200 with Dr. Lorin PicketScott.  Pt instructed  to go directly to ED if symptoms reoccur; verbalizes understanding.  Made aware Dr. Lorin PicketScott may want to see her earlier than 04/09/17; office will call to advise.

## 2017-04-02 NOTE — Telephone Encounter (Signed)
Please see phone note  

## 2017-04-02 NOTE — Telephone Encounter (Signed)
See attached message for recommendation for evaluation.

## 2017-04-02 NOTE — Telephone Encounter (Signed)
I am unable to see her today or tomorrow due to schedule.  If truly had brief droop that was noticeable, I would recommend going ahead with evaluation and then we can f/u next week.  Need to confirm nothing more acute going on.

## 2017-04-09 ENCOUNTER — Ambulatory Visit: Payer: BC Managed Care – PPO | Admitting: Internal Medicine

## 2017-04-09 ENCOUNTER — Encounter: Payer: Self-pay | Admitting: Internal Medicine

## 2017-04-09 VITALS — BP 102/70 | HR 77 | Temp 97.9°F | Resp 16 | Ht 67.0 in | Wt 142.2 lb

## 2017-04-09 DIAGNOSIS — R29810 Facial weakness: Secondary | ICD-10-CM

## 2017-04-09 DIAGNOSIS — F439 Reaction to severe stress, unspecified: Secondary | ICD-10-CM

## 2017-04-09 DIAGNOSIS — R200 Anesthesia of skin: Secondary | ICD-10-CM | POA: Diagnosis not present

## 2017-04-09 DIAGNOSIS — R202 Paresthesia of skin: Secondary | ICD-10-CM | POA: Diagnosis not present

## 2017-04-09 NOTE — Progress Notes (Signed)
Patient ID: Adrienne ForehandLisa Dameron Phillips, female   DOB: [birth year], 47 y.o.   MRN: [id number]   Subjective:    Patient ID: Adrienne Phillips, female    DOB: [birth year], 47 y.o.   MRN: [id number]  HPI  Patient here as a work in appt with concerns regarding previous "eyebrow drooping".  She reports that work and her schedule has been crazy lately.  States that 2 weeks ago, she woke up and looked in the mirror as she was getting ready.  Noticed drooping of her right eyelid.  No other neurological deficit noted.  No slurring of speech.  No swallowing problem.  Drove herself to work.  Took an aspirin after she got to work.  The whole episode lasted approximately 5 hours.  Lasted one hour after she took aspirin.  The drooping completely resolved.  No other neurological changes.  She just felt exhausted for a couple of days.  Continues to exercise regularly.  She has adjusted her schedule and is now taking more time off.  No headache.  No dizziness or light headedness.  The only other thing she has noticed has been some tingling in both arms.  She has neck issues and has attributed the tingling to this.  No weakness.  No significant problem now.  Took skelaxin and this resolved.  Has not had any other episodes since occurred.  Not taking aspirin now.  She reports that when she was a senior in high school, she was going to work and school and had busy schedule.  Had a seizure.  Was evaluated.  Told was overworked.  Never on any medication.  No further seizures.  Overall she feels she is doing well now.     Past Medical History:  Diagnosis Date  . Anemia   . Hypoglycemia   . Iron deficiency   . Migraine   . Ovarian cyst    Past Surgical History:  Procedure Laterality Date  . BREAST BIOPSY Left 1998   cyst   Family History  Problem Relation Age of Onset  . Hypertension Mother        migraines  . Cancer Father        colon and a hx of skin  . Rheum arthritis Brother   . Osteoporosis Paternal Grandmother   .  Ulcers Paternal Grandfather   . Breast cancer Maternal Aunt 70  . Breast cancer Paternal Aunt    Social History   Socioeconomic History  . Marital status: Married    Spouse name: Not on file  . Number of children: 0  . Years of education: Not on file  . Highest education level: Not on file  Occupational History    Employer: [employer]  Social Needs  . Financial resource strain: Not on file  . Food insecurity:    Worry: Not on file    Inability: Not on file  . Transportation needs:    Medical: Not on file    Non-medical: Not on file  Tobacco Use  . Smoking status: Never Smoker  . Smokeless tobacco: Never Used  Substance and Sexual Activity  . Alcohol use: Yes    Alcohol/week: 0.0 oz  . Drug use: No  . Sexual activity: Not on file  Lifestyle  . Physical activity:    Days per week: Not on file    Minutes per session: Not on file  . Stress: Not on file  Relationships  . Social connections:    Talks on phone: Not on file  Gets together: Not on file    Attends religious service: Not on file    Active member of club or organization: Not on file    Attends meetings of clubs or organizations: Not on file    Relationship status: Not on file  Other Topics Concern  . Not on file  Social History Narrative  . Not on file    Outpatient Encounter Medications as of 04/09/2017  Medication Sig  . ALPRAZolam (XANAX) 0.25 MG tablet Take 1 tablet (0.25 mg total) by mouth at bedtime as needed for sleep.  . cyanocobalamin 2000 MCG tablet Take 2,000 mcg by mouth daily.  . ferrous sulfate 325 (65 FE) MG EC tablet Take 325 mg by mouth 3 (three) times daily with meals.  . magnesium gluconate (MAGONATE) 500 MG tablet Take 500 mg by mouth daily.  . metaxalone (SKELAXIN) 800 MG tablet TAKE 1/2 TO 1 TABLET BY MOUTH AT BEDTIME AS NEEDED FOR BACK SPASMS  . Vitamin D, Ergocalciferol, (DRISDOL) 50000 units CAPS capsule Take 1 capsule (50,000 Units total) by mouth every 7 (seven) days.  .  [DISCONTINUED] amoxicillin-clavulanate (AUGMENTIN) 875-125 MG tablet Take 1 tablet by mouth 2 (two) times daily.  . [DISCONTINUED] predniSONE (DELTASONE) 10 MG tablet Take 6 tablets x 1 day then decrease by 1/2 tablet per day until down to zero mg.   No facility-administered encounter medications on file as of 04/09/2017.     Review of Systems  Constitutional: Negative for appetite change and unexpected weight change.  HENT: Negative for congestion and sinus pressure.   Respiratory: Negative for cough, chest tightness and shortness of breath.   Cardiovascular: Negative for chest pain, palpitations and leg swelling.  Gastrointestinal: Negative for abdominal pain, diarrhea, nausea and vomiting.  Genitourinary: Negative for difficulty urinating and dysuria.  Musculoskeletal: Negative for joint swelling and myalgias.  Skin: Negative for color change and rash.  Neurological: Negative for dizziness, light-headedness and headaches.       Facial drooping as outlined.  Tingling in upper extremities has resolved with skelaxin.    Psychiatric/Behavioral: Negative for agitation and dysphoric mood.       Objective:     Blood pressure rechecked by me:  106/62  Physical Exam  Constitutional: She appears well-developed and well-nourished. No distress.  HENT:  Nose: Nose normal.  Mouth/Throat: Oropharynx is clear and moist.  Neck: Neck supple. No thyromegaly present.  Cardiovascular: Normal rate and regular rhythm.  Pulmonary/Chest: Breath sounds normal. No respiratory distress. She has no wheezes.  Abdominal: Soft. Bowel sounds are normal. There is no tenderness.  Musculoskeletal: She exhibits no edema or tenderness.  Grip strength - normal and equal.  Bilateral upper extremity strength normal and equal.    Lymphadenopathy:    She has no cervical adenopathy.  Neurological:  Cranial nerves grossly intact.  No deficit noted.    Skin: No rash noted. No erythema.  Psychiatric: She has a normal  mood and affect. Her behavior is normal.    BP 102/70 (BP Location: Left Arm, Patient Position: Sitting, Cuff Size: Normal)   Pulse 77   Temp 97.9 F (36.6 C) (Oral)   Resp 16   Ht 5\' 7"  (1.702 m)   Wt 142 lb 3.2 oz (64.5 kg)   SpO2 99%   BMI 22.27 kg/m  Wt Readings from Last 3 Encounters:  04/09/17 142 lb 3.2 oz (64.5 kg)  12/19/16 144 lb 9.6 oz (65.6 kg)  10/24/16 142 lb 6.4 oz (64.6 kg)  Lab Results  Component Value Date   WBC 5.2 02/06/2017   HGB 12.3 02/06/2017   HCT 37.0 02/06/2017   PLT 277.0 02/06/2017   GLUCOSE 98 10/24/2016   CHOL 186 11/02/2015   TRIG 40 11/02/2015   HDL 94 11/02/2015   LDLCALC 84 11/02/2015   ALT 13 10/24/2016   AST 14 10/24/2016   NA 136 10/24/2016   K 4.5 10/24/2016   CL 101 10/24/2016   CREATININE 0.65 10/24/2016   BUN 8 10/24/2016   CO2 29 10/24/2016   TSH 0.66 10/24/2016   HGBA1C 5.4 11/02/2015    Mm Screening Breast Tomo Bilateral  Result Date: 11/28/2016 CLINICAL DATA:  Screening. EXAM: 2D DIGITAL SCREENING BILATERAL MAMMOGRAM WITH CAD AND ADJUNCT TOMO COMPARISON:  Previous exam(s). ACR Breast Density Category d: The breast tissue is extremely dense, which lowers the sensitivity of mammography. FINDINGS: There are no findings suspicious for malignancy. Images were processed with CAD. IMPRESSION: No mammographic evidence of malignancy. A result letter of this screening mammogram will be mailed directly to the patient. RECOMMENDATION: Screening mammogram in one year. (Code:SM-B-01Y) BI-RADS CATEGORY  1: Negative. Electronically Signed   By: Frederico Hamman M.D.   On: 11/28/2016 15:30       Assessment & Plan:   Problem List Items Addressed This Visit    Facial droop - Primary    Had the episode she described as eyebrow drooping.  Noticed when looked in mirror.  Episode lasted 5 hours as outlined.  Took aspirin.  No recurring episodes.  Unclear etiology.  Discussed possibilities, including TIA.  Will start her on 81mg  ECASA  daily.  Check MRI and carotid ultrasound.  Will have neurology evaluate.  Informed pt if any recurrence, she was to be evaluated immediately.        Relevant Orders   MR Brain W Wo Contrast   VAS US CAROTID   Ambulatory referral to Neurology   Numbness and tingling of right arm and leg    Had the arm tingling - bilateral.  Not associated with the eyebrow drooping.  Has neck issues.  Took skelaxin and tingling resolved.  Plan neuro w/up as outlined.  Neck stable now.        Stress    Increased stress with her work.  She has cut back her schedule.  Overall she feels she is doing relatively well.  Follow.            Dale [place], MD

## 2017-04-10 DIAGNOSIS — R202 Paresthesia of skin: Secondary | ICD-10-CM | POA: Insufficient documentation

## 2017-04-10 DIAGNOSIS — R200 Anesthesia of skin: Secondary | ICD-10-CM | POA: Insufficient documentation

## 2017-04-10 DIAGNOSIS — R29810 Facial weakness: Secondary | ICD-10-CM | POA: Insufficient documentation

## 2017-04-10 NOTE — Assessment & Plan Note (Signed)
Had the episode she described as eyebrow drooping.  Noticed when looked in mirror.  Episode lasted 5 hours as outlined.  Took aspirin.  No recurring episodes.  Unclear etiology.  Discussed possibilities, including TIA.  Will start her on 81mg  ECASA daily.  Check MRI and carotid ultrasound.  Will have neurology evaluate.  Informed pt if any recurrence, she was to be evaluated immediately.

## 2017-04-10 NOTE — Assessment & Plan Note (Signed)
Had the arm tingling - bilateral.  Not associated with the eyebrow drooping.  Has neck issues.  Took skelaxin and tingling resolved.  Plan neuro w/up as outlined.  Neck stable now.

## 2017-04-10 NOTE — Assessment & Plan Note (Signed)
Increased stress with her work.  She has cut back her schedule.  Overall she feels she is doing relatively well.  Follow.

## 2017-04-14 ENCOUNTER — Other Ambulatory Visit: Payer: Self-pay | Admitting: Internal Medicine

## 2017-04-14 DIAGNOSIS — R29810 Facial weakness: Secondary | ICD-10-CM

## 2017-04-14 NOTE — Progress Notes (Signed)
Order placed for carotid ultrasound.   

## 2017-04-17 ENCOUNTER — Ambulatory Visit
Admission: RE | Admit: 2017-04-17 | Discharge: 2017-04-17 | Disposition: A | Discharge status: Home / Self Care | Payer: BC Managed Care – PPO | Source: Ambulatory Visit | Attending: Internal Medicine | Admitting: Internal Medicine

## 2017-04-17 DIAGNOSIS — E041 Nontoxic single thyroid nodule: Secondary | ICD-10-CM | POA: Insufficient documentation

## 2017-04-17 DIAGNOSIS — R29810 Facial weakness: Secondary | ICD-10-CM | POA: Diagnosis not present

## 2017-04-21 ENCOUNTER — Other Ambulatory Visit: Payer: Self-pay | Admitting: Internal Medicine

## 2017-04-21 DIAGNOSIS — E041 Nontoxic single thyroid nodule: Secondary | ICD-10-CM

## 2017-04-21 NOTE — Progress Notes (Signed)
Order placed for f/u thyroid ultrasound.

## 2017-04-24 ENCOUNTER — Ambulatory Visit: Payer: BC Managed Care – PPO | Admitting: Internal Medicine

## 2017-04-27 ENCOUNTER — Telehealth: Payer: Self-pay | Admitting: Internal Medicine

## 2017-04-27 NOTE — Telephone Encounter (Signed)
Carotid US but not a recent MRI.

## 2017-04-27 NOTE — Telephone Encounter (Signed)
Copied from CRM [id number]. Topic: Quick Communication - Other Results >> Apr 27, 2017  3:38 PM Jonette EvaBarksdale, Harvey B wrote: Pt called to get results of her MRI, call pt to advise

## 2017-04-28 ENCOUNTER — Ambulatory Visit: Payer: BC Managed Care – PPO

## 2017-04-28 NOTE — Telephone Encounter (Signed)
MRI results placed in your results folder for review

## 2017-04-28 NOTE — Telephone Encounter (Signed)
Please call and notify pt that I have not received results of MRI.  Please confirm if she has had MRI and if so, we need results.

## 2017-04-28 NOTE — Telephone Encounter (Signed)
See last unrouted message.  

## 2017-04-28 NOTE — Telephone Encounter (Signed)
Patient aware of MRI results and has already seen Dr. Sherryll BurgerShah and has a f/u w/ him in May

## 2017-04-28 NOTE — Telephone Encounter (Signed)
Please call and notify pt that I have been out of the office.  Just received results.  MRI reveals no mass or infarct.  No abnormality involving the facial nerve.  MRI - ok.  Given her presentation (facial droop), I would like for her to see neurology.

## 2017-04-30 ENCOUNTER — Ambulatory Visit: Payer: BC Managed Care – PPO

## 2017-05-15 ENCOUNTER — Ambulatory Visit
Admission: RE | Admit: 2017-05-15 | Discharge: 2017-05-15 | Disposition: A | Discharge status: Home / Self Care | Payer: BC Managed Care – PPO | Source: Ambulatory Visit | Attending: Internal Medicine | Admitting: Internal Medicine

## 2017-05-15 DIAGNOSIS — E041 Nontoxic single thyroid nodule: Secondary | ICD-10-CM | POA: Diagnosis not present

## 2017-06-17 ENCOUNTER — Ambulatory Visit: Payer: BC Managed Care – PPO | Admitting: Internal Medicine

## 2017-11-16 ENCOUNTER — Encounter: Payer: Self-pay | Admitting: Internal Medicine

## 2018-01-01 ENCOUNTER — Encounter: Payer: BC Managed Care – PPO | Admitting: Internal Medicine

## 2018-01-19 ENCOUNTER — Ambulatory Visit: Payer: BC Managed Care – PPO | Admitting: Family Medicine

## 2018-01-19 VITALS — BP 100/62 | HR 77 | Temp 98.4°F | Ht 67.0 in | Wt 142.6 lb

## 2018-01-19 DIAGNOSIS — R0789 Other chest pain: Secondary | ICD-10-CM | POA: Diagnosis not present

## 2018-01-19 DIAGNOSIS — R0781 Pleurodynia: Secondary | ICD-10-CM

## 2018-01-19 DIAGNOSIS — R1010 Upper abdominal pain, unspecified: Secondary | ICD-10-CM

## 2018-01-19 DIAGNOSIS — N644 Mastodynia: Secondary | ICD-10-CM | POA: Diagnosis not present

## 2018-01-19 LAB — COMPREHENSIVE METABOLIC PANEL
AG Ratio: 1.8 (calc) (ref 1.0–2.5)
ALT: 17 U/L (ref 6–29)
AST: 16 U/L (ref 10–35)
Albumin: 4.4 g/dL (ref 3.6–5.1)
Alkaline phosphatase (APISO): 57 U/L (ref 33–115)
BUN: 12 mg/dL (ref 7–25)
CO2: 25 mmol/L (ref 20–32)
Calcium: 8.8 mg/dL (ref 8.6–10.2)
Chloride: 104 mmol/L (ref 98–110)
Creat: 0.74 mg/dL (ref 0.50–1.10)
Globulin: 2.5 g/dL (calc) (ref 1.9–3.7)
Glucose, Bld: 82 mg/dL (ref 65–99)
Potassium: 4.6 mmol/L (ref 3.5–5.3)
Sodium: 137 mmol/L (ref 135–146)
Total Bilirubin: 0.5 mg/dL (ref 0.2–1.2)
Total Protein: 6.9 g/dL (ref 6.1–8.1)

## 2018-01-19 LAB — CBC WITH DIFFERENTIAL/PLATELET
Absolute Monocytes: 541 cells/uL (ref 200–950)
Basophils Absolute: 62 cells/uL (ref 0–200)
Basophils Relative: 1.2 %
Eosinophils Absolute: 31 cells/uL (ref 15–500)
Eosinophils Relative: 0.6 %
HCT: 35 % (ref 35.0–45.0)
Hemoglobin: 11.6 g/dL — ABNORMAL LOW (ref 11.7–15.5)
Lymphs Abs: 962 cells/uL (ref 850–3900)
MCH: 28.9 pg (ref 27.0–33.0)
MCHC: 33.1 g/dL (ref 32.0–36.0)
MCV: 87.1 fL (ref 80.0–100.0)
MPV: 10.7 fL (ref 7.5–12.5)
Monocytes Relative: 10.4 %
Neutro Abs: 3604 cells/uL (ref 1500–7800)
Neutrophils Relative %: 69.3 %
Platelets: 260 10*3/uL (ref 140–400)
RBC: 4.02 10*6/uL (ref 3.80–5.10)
RDW: 13.4 % (ref 11.0–15.0)
Total Lymphocyte: 18.5 %
WBC: 5.2 10*3/uL (ref 3.8–10.8)

## 2018-01-19 LAB — AMYLASE: Amylase: 30 U/L (ref 21–101)

## 2018-01-19 LAB — LIPASE: Lipase: 6 U/L — ABNORMAL LOW (ref 7–60)

## 2018-01-19 NOTE — Progress Notes (Signed)
Subjective:    Patient ID: Adrienne ForehandLisa Dameron Phillips, female    DOB: [birth year], 47 y.o.   MRN: [id number]  HPI  Presents to clinic c/o pain on left side of chest under breast for 3 weeks off and on.  Patient states she also has pain on her upper abdomen, more so on the left side and at times will wrap around to left back.  She also states that she sees a massage therapist regularly, and the massage therapist felt a knot in the left breast, patient states when this area was massaged it was quite painful.  Patient's last mammogram was in November 2018, other than having dense breasts the BI-RADS score given was 1.  Patient is a vegetarian, tries to follow a healthy diet, does not like to take many medications if she can avoid this.  Denies nausea, vomiting or diarrhea.  Denies shortness of breath or wheezing.  Denies cough.  Denies fever or chills.  Patient Active Problem List   Diagnosis Date Noted  . Facial droop 04/10/2017  . Numbness and tingling of right arm and leg 04/10/2017  . Vaginal lesion 12/21/2016  . Sinusitis 10/10/2016  . Stress 07/18/2016  . Abnormal Pap smear of cervix 01/06/2016  . Chest pain 06/30/2015  . Left arm numbness 06/27/2015  . Neck pain 06/27/2015  . Bad odor of urine 11/13/2014  . Back pain 11/08/2014  . History of colonic polyps 03/19/2014  . Health care maintenance 03/19/2014  . Congestion of nasal sinus 03/19/2014  . Barrett's esophagus 03/20/2013  . Anemia 02/15/2012  . Leukopenia 02/15/2012   Social History   Tobacco Use  . Smoking status: Never Smoker  . Smokeless tobacco: Never Used  Substance Use Topics  . Alcohol use: Yes    Alcohol/week: 0.0 standard drinks   Review of Systems  Constitutional: Negative for chills, fatigue and fever.  HENT: Negative for congestion, ear pain, sinus pain and sore throat.   Eyes: Negative.   Respiratory: Negative for cough, shortness of breath and wheezing.   Cardiovascular: Negative for palpitations and  leg swelling. +left chest pain/left breast pain Gastrointestinal: +Upper abd pain, left side. Negative for diarrhea, nausea and vomiting.  Genitourinary: Negative for dysuria, frequency and urgency.  Musculoskeletal: Negative for arthralgias and myalgias.  Skin: Negative for color change, pallor and rash.  Neurological: Negative for syncope, light-headedness and headaches.  Psychiatric/Behavioral: The patient is not nervous/anxious.       Objective:   Physical Exam Chest:     Breasts:        Right: No swelling, bleeding, inverted nipple, mass, nipple discharge, skin change or tenderness.        Left: Tenderness present. No swelling, bleeding, inverted nipple, mass, nipple discharge or skin change.       Comments: Area of tenderness on the left breast represented by red mark on diagram.  No obvious mass or lump felt in this region, but patient states it is quite tender when palpated   Constitutional: She appears well-developed and well-nourished. No distress.  HENT:  Head: Normocephalic and atraumatic.  Eyes: Pupils are equal, round, and reactive to light. EOM are normal. No scleral icterus.  Neck: Normal range of motion. Neck supple. No tracheal deviation present.  Cardiovascular: Normal rate, regular rhythm and normal heart sounds.  Pulmonary/Chest: Effort normal and breath sounds normal. No respiratory distress. She has no wheezes. She has no rales.  Abdominal: Soft. Bowel sounds are normal. There is no tenderness. No guarding, mass,  rebound or CVA tenderness.  Neurological: She is alert and oriented to person, place, and time.  Gait normal  Skin: Skin is warm and dry. No pallor.  Psychiatric: She has a normal mood and affect. Her behavior is normal.    Nursing note and vitals reviewed.  Vitals:   01/19/18 1044  BP: 100/62  Pulse: 77  Temp: 98.4 F (36.9 C)  SpO2: 99%   Wt Readings from Last 3 Encounters:  01/19/18 142 lb 9.6 oz (64.7 kg)  04/09/17 142 lb 3.2 oz (64.5  kg)  12/19/16 144 lb 9.6 oz (65.6 kg)      Assessment & Plan:   Left-sided chest pain - EKG done in clinic and reviewed by me.  EKG is unremarkable for any acute changes, this EKG was also compared to one done approximately 6 months ago in the clinic as well.  I suspect the pain patient is feeling is not cardiac in nature.  Upper abdominal pain - we will get CBC, CMP, amylase and lipase levels drawn in clinic today.  We will also get abdominal ultrasound due to upper abdominal pain that wraps around to back.  I question if possible gallbladder issues are at play.  Left breast pain - due to painful area on left breast, we will get diagnostic mammogram.  Patient is also due for her annual screening mammogram, so we will make the diagnostic mammogram bilateral to be sure and check both breasts.  Patient will keep regularly scheduled follow-up with PCP as planned.  She is aware that she will be contacted in regards to mammogram and ultrasound appointments.  We will also make her aware of lab results when they are available.  Advised that if her pain worsens or becomes severe at any time, she should call office and or go to emergency room right away.

## 2018-01-27 ENCOUNTER — Encounter: Payer: Self-pay | Admitting: Internal Medicine

## 2018-01-28 ENCOUNTER — Other Ambulatory Visit: Payer: Self-pay | Admitting: Family Medicine

## 2018-01-28 DIAGNOSIS — N644 Mastodynia: Secondary | ICD-10-CM

## 2018-01-29 ENCOUNTER — Encounter: Payer: Self-pay | Admitting: Family Medicine

## 2018-01-29 ENCOUNTER — Ambulatory Visit
Admission: RE | Admit: 2018-01-29 | Discharge: 2018-01-29 | Disposition: A | Discharge status: Home / Self Care | Payer: BC Managed Care – PPO | Source: Ambulatory Visit | Attending: Family Medicine | Admitting: Family Medicine

## 2018-01-29 DIAGNOSIS — R1010 Upper abdominal pain, unspecified: Secondary | ICD-10-CM | POA: Diagnosis not present

## 2018-02-12 ENCOUNTER — Other Ambulatory Visit: Payer: BC Managed Care – PPO

## 2018-02-19 ENCOUNTER — Ambulatory Visit
Admission: RE | Admit: 2018-02-19 | Discharge: 2018-02-19 | Disposition: A | Discharge status: Home / Self Care | Payer: BC Managed Care – PPO | Source: Ambulatory Visit | Attending: Family Medicine | Admitting: Family Medicine

## 2018-02-19 DIAGNOSIS — N644 Mastodynia: Secondary | ICD-10-CM | POA: Insufficient documentation

## 2018-03-11 ENCOUNTER — Ambulatory Visit (INDEPENDENT_AMBULATORY_CARE_PROVIDER_SITE_OTHER): Payer: BC Managed Care – PPO | Admitting: Internal Medicine

## 2018-03-11 ENCOUNTER — Encounter: Payer: Self-pay | Admitting: Internal Medicine

## 2018-03-11 ENCOUNTER — Other Ambulatory Visit (HOSPITAL_COMMUNITY)
Admission: RE | Admit: 2018-03-11 | Discharge: 2018-03-11 | Disposition: A | Discharge status: Home / Self Care | Payer: BC Managed Care – PPO | Source: Ambulatory Visit | Attending: Internal Medicine | Admitting: Internal Medicine

## 2018-03-11 VITALS — BP 118/72 | HR 56 | Temp 97.5°F | Resp 18 | Ht 67.0 in | Wt 145.4 lb

## 2018-03-11 DIAGNOSIS — Z0000 Encounter for general adult medical examination without abnormal findings: Secondary | ICD-10-CM | POA: Diagnosis not present

## 2018-03-11 DIAGNOSIS — D72819 Decreased white blood cell count, unspecified: Secondary | ICD-10-CM

## 2018-03-11 DIAGNOSIS — D649 Anemia, unspecified: Secondary | ICD-10-CM

## 2018-03-11 DIAGNOSIS — M542 Cervicalgia: Secondary | ICD-10-CM

## 2018-03-11 DIAGNOSIS — R87610 Atypical squamous cells of undetermined significance on cytologic smear of cervix (ASC-US): Secondary | ICD-10-CM

## 2018-03-11 DIAGNOSIS — Z124 Encounter for screening for malignant neoplasm of cervix: Secondary | ICD-10-CM

## 2018-03-11 DIAGNOSIS — K2270 Barrett's esophagus without dysplasia: Secondary | ICD-10-CM

## 2018-03-11 DIAGNOSIS — Z860100 Personal history of colon polyps, unspecified: Secondary | ICD-10-CM

## 2018-03-11 DIAGNOSIS — Z13220 Encounter for screening for lipoid disorders: Secondary | ICD-10-CM

## 2018-03-11 DIAGNOSIS — F439 Reaction to severe stress, unspecified: Secondary | ICD-10-CM

## 2018-03-11 NOTE — Assessment & Plan Note (Signed)
Physical today 03/11/18.  PAP 03/11/18.  Mammogram 02/19/18 - Birads II.

## 2018-03-11 NOTE — Progress Notes (Signed)
Patient ID: Adrienne ForehandLisa Dameron Dry, female   DOB: 27-Jul-1970, 48 y.o.   MRN: [id number]   Subjective:    Patient ID: Adrienne Phillips, female    DOB: 27-Jul-1970, 48 y.o.   MRN: [id number]  HPI  Patient here for her physical exam.  She reports she is doing relatively well.  States she will be finishing school in 6 weeks.  Working and going to school.  Back with her husband.  States they are getting along well.  Not exercising as much.  Plans to start exercising more.  No chest pain. No sob.  No acid reflux.  No abdominal pain.  Bowels moving.  No urine change.  Increased fatigue before her period.  Periods regular.  Neck is better.     Past Medical History:  Diagnosis Date  . Anemia   . Hypoglycemia   . Iron deficiency   . Migraine   . Ovarian cyst    Past Surgical History:  Procedure Laterality Date  . BREAST CYST ASPIRATION Left 1998   Family History  Problem Relation Age of Onset  . Hypertension Mother        migraines  . Cancer Father        colon and a hx of skin  . Rheum arthritis Brother   . Osteoporosis Paternal Grandmother   . Ulcers Paternal Grandfather   . Breast cancer Maternal Aunt 70  . Breast cancer Paternal Aunt    Social History   Socioeconomic History  . Marital status: Married    Spouse name: Not on file  . Number of children: 0  . Years of education: Not on file  . Highest education level: Not on file  Occupational History    Employer: [employer]  Social Needs  . Financial resource strain: Not on file  . Food insecurity:    Worry: Not on file    Inability: Not on file  . Transportation needs:    Medical: Not on file    Non-medical: Not on file  Tobacco Use  . Smoking status: Never Smoker  . Smokeless tobacco: Never Used  Substance and Sexual Activity  . Alcohol use: Yes    Alcohol/week: 0.0 standard drinks  . Drug use: No  . Sexual activity: Not on file  Lifestyle  . Physical activity:    Days per week: Not on file    Minutes per session: Not on  file  . Stress: Not on file  Relationships  . Social connections:    Talks on phone: Not on file    Gets together: Not on file    Attends religious service: Not on file    Active member of club or organization: Not on file    Attends meetings of clubs or organizations: Not on file    Relationship status: Not on file  Other Topics Concern  . Not on file  Social History Narrative  . Not on file    Outpatient Encounter Medications as of 03/11/2018  Medication Sig  . ALPRAZolam (XANAX) 0.25 MG tablet Take 1 tablet (0.25 mg total) by mouth at bedtime as needed for sleep.  . cyanocobalamin 2000 MCG tablet Take 2,000 mcg by mouth daily.  . metaxalone (SKELAXIN) 800 MG tablet TAKE 1/2 TO 1 TABLET BY MOUTH AT BEDTIME AS NEEDED FOR BACK SPASMS  . Vitamin D, Ergocalciferol, (DRISDOL) 50000 units CAPS capsule Take 1 capsule (50,000 Units total) by mouth every 7 (seven) days.  . [DISCONTINUED] ferrous sulfate 325 (65 FE) MG  EC tablet Take 325 mg by mouth 3 (three) times daily with meals.  . [DISCONTINUED] magnesium gluconate (MAGONATE) 500 MG tablet Take 500 mg by mouth daily.   No facility-administered encounter medications on file as of 03/11/2018.     Review of Systems  Constitutional: Negative for appetite change and unexpected weight change.  HENT: Negative for congestion and sinus pressure.   Eyes: Negative for pain and visual disturbance.  Respiratory: Negative for cough, chest tightness and shortness of breath.   Cardiovascular: Negative for chest pain, palpitations and leg swelling.  Gastrointestinal: Negative for abdominal pain, diarrhea, nausea and vomiting.  Genitourinary: Negative for difficulty urinating and dysuria.  Musculoskeletal: Negative for joint swelling and myalgias.  Skin: Negative for color change and rash.  Neurological: Negative for dizziness, light-headedness and headaches.  Hematological: Negative for adenopathy. Does not bruise/bleed easily.    Psychiatric/Behavioral: Negative for agitation and dysphoric mood.       Objective:    Physical Exam Constitutional:      General: She is not in acute distress.    Appearance: Normal appearance. She is well-developed.  HENT:     Nose: Nose normal. No congestion.     Mouth/Throat:     Pharynx: No oropharyngeal exudate or posterior oropharyngeal erythema.  Eyes:     General: No scleral icterus.       Right eye: No discharge.        Left eye: No discharge.  Neck:     Musculoskeletal: Neck supple. No muscular tenderness.     Thyroid: No thyromegaly.  Cardiovascular:     Rate and Rhythm: Normal rate and regular rhythm.  Pulmonary:     Effort: No tachypnea, accessory muscle usage or respiratory distress.     Breath sounds: Normal breath sounds. No decreased breath sounds or wheezing.  Chest:     Breasts:        Right: No inverted nipple, mass, nipple discharge or tenderness (no axillary adenopathy).        Left: No inverted nipple, mass, nipple discharge or tenderness (no axilarry adenopathy).  Abdominal:     General: Bowel sounds are normal.     Palpations: Abdomen is soft.     Tenderness: There is no abdominal tenderness.  Genitourinary:    Comments: Normal external genitalia.  Vaginal vault without lesions.  Cervix identified.  Pap smear performed.  Could not appreciate any adnexal masses or tenderness.   Musculoskeletal:        General: No swelling or tenderness.  Lymphadenopathy:     Cervical: No cervical adenopathy.  Skin:    Findings: No erythema or rash.  Neurological:     Mental Status: She is alert and oriented to person, place, and time.  Psychiatric:        Mood and Affect: Mood normal.        Behavior: Behavior normal.     BP 118/72   Pulse (!) 56   Temp (!) 97.5 F (36.4 C) (Oral)   Resp 18   Ht 5\' 7"  (1.702 m)   Wt 145 lb 6.4 oz (66 kg)   SpO2 99%   BMI 22.77 kg/m  Wt Readings from Last 3 Encounters:  03/11/18 145 lb 6.4 oz (66 kg)  01/19/18  142 lb 9.6 oz (64.7 kg)  04/09/17 142 lb 3.2 oz (64.5 kg)     Lab Results  Component Value Date   WBC 5.2 01/19/2018   HGB 11.6 (L) 01/19/2018   HCT 35.0 01/19/2018  PLT 260 01/19/2018   GLUCOSE 82 01/19/2018   CHOL 186 11/02/2015   TRIG 40 11/02/2015   HDL 94 11/02/2015   LDLCALC 84 11/02/2015   ALT 17 01/19/2018   AST 16 01/19/2018   NA 137 01/19/2018   K 4.6 01/19/2018   CL 104 01/19/2018   CREATININE 0.74 01/19/2018   BUN 12 01/19/2018   CO2 25 01/19/2018   TSH 0.66 10/24/2016   HGBA1C 5.4 11/02/2015    US Breast Ltd Uni Left Inc Axilla  Result Date: 02/19/2018 CLINICAL DATA:  Palpable lump in the left breast EXAM: DIGITAL DIAGNOSTIC BILATERAL MAMMOGRAM WITH CAD AND TOMO ULTRASOUND LEFT BREAST COMPARISON:  Previous exam(s). ACR Breast Density Category c: The breast tissue is heterogeneously dense, which may obscure small masses. FINDINGS: No suspicious masses, calcifications, or distortion in either breast. Mammographic images were processed with CAD. On physical exam, no suspicious lumps are identified. Targeted ultrasound is performed, showing dense breast tissue in the left breast correlating with the patient's palpable lump. IMPRESSION: No mammographic or sonographic evidence of malignancy. RECOMMENDATION: Treatment of the patient's symptoms should be based on clinical and physical exam given lack of imaging findings. Recommend annual screening mammography. I have discussed the findings and recommendations with the patient. Results were also provided in writing at the conclusion of the visit. If applicable, a reminder letter will be sent to the patient regarding the next appointment. BI-RADS CATEGORY  2: Benign. Electronically Signed   By: Gerome Sam III M.D   On: 02/19/2018 15:56   Mm Diag Breast Tomo Bilateral  Result Date: 02/19/2018 CLINICAL DATA:  Palpable lump in the left breast EXAM: DIGITAL DIAGNOSTIC BILATERAL MAMMOGRAM WITH CAD AND TOMO ULTRASOUND LEFT BREAST  COMPARISON:  Previous exam(s). ACR Breast Density Category c: The breast tissue is heterogeneously dense, which may obscure small masses. FINDINGS: No suspicious masses, calcifications, or distortion in either breast. Mammographic images were processed with CAD. On physical exam, no suspicious lumps are identified. Targeted ultrasound is performed, showing dense breast tissue in the left breast correlating with the patient's palpable lump. IMPRESSION: No mammographic or sonographic evidence of malignancy. RECOMMENDATION: Treatment of the patient's symptoms should be based on clinical and physical exam given lack of imaging findings. Recommend annual screening mammography. I have discussed the findings and recommendations with the patient. Results were also provided in writing at the conclusion of the visit. If applicable, a reminder letter will be sent to the patient regarding the next appointment. BI-RADS CATEGORY  2: Benign. Electronically Signed   By: Gerome Sam III M.D   On: 02/19/2018 15:56       Assessment & Plan:   Problem List Items Addressed This Visit    Abnormal Pap smear of cervix    Repeat pap today.       Anemia    Recheck cbc and ferritin.        Relevant Orders   CBC with Differential/Platelet   Ferritin   Comprehensive metabolic panel   TSH   Vitamin B12   Barrett's esophagus    EGD 12/2012.  Recommended f/u EGD in 12/2017.        Health care maintenance    Physical today 03/11/18.  PAP 03/11/18.  Mammogram 02/19/18 - [facility].        History of colonic polyps    Per records, overdue colonoscopy.  D/w her regarding f/u colonosocpy.        Leukopenia    Follow cbc.  Neck pain    Stable.        Stress    Increased stress as outlined.  Discussed with her today.  She does not feel needs anything more at this time.  Follow.         Other Visit Diagnoses    Routine general medical examination at a health care facility    -  Primary   Cervical cancer  screening       Relevant Orders   Cytology - PAP( [facility])   Screening cholesterol level       Relevant Orders   Lipid panel       Dale Nipomo, MD

## 2018-03-13 ENCOUNTER — Encounter: Payer: Self-pay | Admitting: Internal Medicine

## 2018-03-13 NOTE — Assessment & Plan Note (Signed)
Per records, overdue colonoscopy.  D/w her regarding f/u colonosocpy.

## 2018-03-13 NOTE — Assessment & Plan Note (Signed)
Increased stress as outlined.  Discussed with her today.  She does not feel needs anything more at this time.  Follow.   

## 2018-03-13 NOTE — Assessment & Plan Note (Signed)
Follow cbc.  

## 2018-03-13 NOTE — Assessment & Plan Note (Signed)
Repeat pap today

## 2018-03-13 NOTE — Assessment & Plan Note (Signed)
EGD 12/2012.  Recommended f/u EGD in 12/2017.

## 2018-03-13 NOTE — Assessment & Plan Note (Signed)
Recheck cbc and ferritin.   

## 2018-03-13 NOTE — Assessment & Plan Note (Signed)
Stable

## 2018-03-16 ENCOUNTER — Encounter: Payer: Self-pay | Admitting: Internal Medicine

## 2018-03-16 LAB — CYTOLOGY - PAP
Diagnosis: NEGATIVE
HPV: NOT DETECTED

## 2018-04-13 ENCOUNTER — Other Ambulatory Visit: Payer: BC Managed Care – PPO

## 2018-04-14 ENCOUNTER — Encounter: Payer: Self-pay | Admitting: Internal Medicine

## 2018-09-07 ENCOUNTER — Telehealth: Payer: Self-pay | Admitting: Internal Medicine

## 2018-09-07 NOTE — Telephone Encounter (Signed)
Left message to call the office to reschedule lab appointment/vfw [phone]

## 2018-09-08 ENCOUNTER — Other Ambulatory Visit: Payer: BC Managed Care – PPO

## 2018-09-10 ENCOUNTER — Other Ambulatory Visit (INDEPENDENT_AMBULATORY_CARE_PROVIDER_SITE_OTHER): Payer: BC Managed Care – PPO

## 2018-09-10 ENCOUNTER — Other Ambulatory Visit: Payer: Self-pay

## 2018-09-10 ENCOUNTER — Ambulatory Visit (INDEPENDENT_AMBULATORY_CARE_PROVIDER_SITE_OTHER): Payer: BC Managed Care – PPO | Admitting: Internal Medicine

## 2018-09-10 ENCOUNTER — Encounter: Payer: Self-pay | Admitting: Internal Medicine

## 2018-09-10 DIAGNOSIS — M542 Cervicalgia: Secondary | ICD-10-CM

## 2018-09-10 DIAGNOSIS — D649 Anemia, unspecified: Secondary | ICD-10-CM

## 2018-09-10 DIAGNOSIS — N926 Irregular menstruation, unspecified: Secondary | ICD-10-CM | POA: Diagnosis not present

## 2018-09-10 DIAGNOSIS — M2550 Pain in unspecified joint: Secondary | ICD-10-CM | POA: Diagnosis not present

## 2018-09-10 DIAGNOSIS — M546 Pain in thoracic spine: Secondary | ICD-10-CM | POA: Diagnosis not present

## 2018-09-10 DIAGNOSIS — Z13220 Encounter for screening for lipoid disorders: Secondary | ICD-10-CM

## 2018-09-10 DIAGNOSIS — F439 Reaction to severe stress, unspecified: Secondary | ICD-10-CM

## 2018-09-10 DIAGNOSIS — K2270 Barrett's esophagus without dysplasia: Secondary | ICD-10-CM

## 2018-09-10 NOTE — Addendum Note (Signed)
Addended by: WRIGHT, LATOYA S on: 09/10/2018 03:11 PM   Modules accepted: Orders  

## 2018-09-10 NOTE — Addendum Note (Signed)
Addended by: Kylee Umana S on: 09/10/2018 03:11 PM   Modules accepted: Orders  

## 2018-09-10 NOTE — Addendum Note (Signed)
Addended by: Graceyn Fodor S on: 09/10/2018 03:11 PM   Modules accepted: Orders  

## 2018-09-10 NOTE — Addendum Note (Signed)
Addended by: Leeanne Rio on: 09/10/2018 03:11 PM   Modules accepted: Orders

## 2018-09-10 NOTE — Addendum Note (Signed)
Addended by: Mireille Lacombe S on: 09/10/2018 03:11 PM   Modules accepted: Orders  

## 2018-09-10 NOTE — Progress Notes (Signed)
Patient ID: Adrienne Phillips, female   DOB: [birth year], 48 y.o.   MRN: [id number]   Virtual Visit via video Note  This visit type was conducted due to national recommendations for restrictions regarding the COVID-19 pandemic (e.g. social distancing).  This format is felt to be most appropriate for this patient at this time.  All issues noted in this document were discussed and addressed.  No physical exam was performed (except for noted visual exam findings with Video Visits).   I connected with Adrienne Phillips today by a video enabled telemedicine application and verified that I am speaking with the correct person using two identifiers. Location patient: home Location provider: work Persons participating in the virtual visit: patient, provider  I discussed the limitations, risks, security and privacy concerns of performing an evaluation and management service by virtual and the availability of in person appointments. The patient expressed understanding and agreed to proceed.   Reason for visit: scheduled follow up.    HPI: She reports she is doing well.  Working from home.  Stress is better.  Enjoying working from home.  Is exercising - running/yoga.  Neck is stable.  Also using a stand up desk.  Describes some intermittent joint pain.  Flares worse at times.  Also has noticed some break through bleeding.  Periods regular.  Recently has noticed some break through for short period.  No hot flashes.  Describes more mood swings.  Taking oral b12, multivitamin and magnesium.  Eating and drinking well.  No chest pain.  No sob.  No abdominal pain.  Bowels moving.  She request to have [facility] checked.     ROS: See pertinent positives and negatives per HPI.  Past Medical History:  Diagnosis Date  . Anemia   . Hypoglycemia   . Iron deficiency   . Migraine   . Ovarian cyst     Past Surgical History:  Procedure Laterality Date  . BREAST CYST ASPIRATION Left 1998    Family History  Problem Relation  Age of Onset  . Hypertension Mother        migraines  . Cancer Father        colon and a hx of skin  . Rheum arthritis Brother   . Osteoporosis Paternal Grandmother   . Ulcers Paternal Grandfather   . Breast cancer Maternal Aunt 70  . Breast cancer Paternal Aunt     SOCIAL HX: reviewed.    Current Outpatient Medications:  .  ALPRAZolam (XANAX) 0.25 MG tablet, Take 1 tablet (0.25 mg total) by mouth at bedtime as needed for sleep., Disp: 30 tablet, Rfl: 0 .  cyanocobalamin 2000 MCG tablet, Take 2,000 mcg by mouth daily., Disp: , Rfl:  .  metaxalone (SKELAXIN) 800 MG tablet, TAKE 1/2 TO 1 TABLET BY MOUTH AT BEDTIME AS NEEDED FOR BACK SPASMS, Disp: 30 tablet, Rfl: 0 .  Vitamin D, Ergocalciferol, (DRISDOL) 50000 units CAPS capsule, Take 1 capsule (50,000 Units total) by mouth every 7 (seven) days., Disp: 8 capsule, Rfl: 0   EXAM:  GENERAL: alert, oriented, appears well and in no acute distress  HEENT: atraumatic, conjunttiva clear, no obvious abnormalities on inspection of external nose and ears  NECK: normal movements of the head and neck  LUNGS: on inspection no signs of respiratory distress, breathing rate appears normal, no obvious gross SOB, gasping or wheezing  CV: no obvious cyanosis  PSYCH/NEURO: pleasant and cooperative, no obvious depression or anxiety, speech and thought processing grossly intact   ASSESSMENT  AND PLAN:  Discussed the following assessment and plan:  No problem-specific Assessment & Plan notes found for this encounter.    I discussed the assessment and treatment plan with the patient. The patient was provided an opportunity to ask questions and all were answered. The patient agreed with the plan and demonstrated an understanding of the instructions.   The patient was advised to call back or seek an in-person evaluation if the symptoms worsen or if the condition fails to improve as anticipated.   Dale Durhamharlene Romilda Proby, MD

## 2018-09-11 ENCOUNTER — Encounter: Payer: Self-pay | Admitting: Internal Medicine

## 2018-09-11 LAB — TSH: TSH: 1.38 u[IU]/mL (ref 0.450–4.500)

## 2018-09-11 LAB — LIPID PANEL
Chol/HDL Ratio: 1.7 ratio (ref 0.0–4.4)
Cholesterol, Total: 173 mg/dL (ref 100–199)
HDL: 102 mg/dL (ref >39)
LDL Calculated: 64 mg/dL (ref 0–99)
Triglycerides: 36 mg/dL (ref 0–149)
VLDL Cholesterol Cal: 7 mg/dL (ref 5–40)

## 2018-09-11 LAB — COMPREHENSIVE METABOLIC PANEL
ALT: 18 IU/L (ref 0–32)
AST: 22 IU/L (ref 0–40)
Albumin/Globulin Ratio: 2 (ref 1.2–2.2)
Albumin: 4.3 g/dL (ref 3.8–4.8)
Alkaline Phosphatase: 57 IU/L (ref 39–117)
BUN/Creatinine Ratio: 10 (ref 9–23)
BUN: 7 mg/dL (ref 6–24)
Bilirubin Total: 0.3 mg/dL (ref 0.0–1.2)
CO2: 23 mmol/L (ref 20–29)
Calcium: 8.7 mg/dL (ref 8.7–10.2)
Chloride: 100 mmol/L (ref 96–106)
Creatinine, Ser: 0.73 mg/dL (ref 0.57–1.00)
GFR calc Af Amer: 113 mL/min/{1.73_m2} (ref >59)
GFR calc non Af Amer: 98 mL/min/{1.73_m2} (ref >59)
Globulin, Total: 2.1 g/dL (ref 1.5–4.5)
Glucose: 85 mg/dL (ref 65–99)
Potassium: 4.7 mmol/L (ref 3.5–5.2)
Sodium: 137 mmol/L (ref 134–144)
Total Protein: 6.4 g/dL (ref 6.0–8.5)

## 2018-09-11 LAB — CBC WITH DIFFERENTIAL/PLATELET
Basophils Absolute: 0.1 10*3/uL (ref 0.0–0.2)
Basos: 2 %
EOS (ABSOLUTE): 0.1 10*3/uL (ref 0.0–0.4)
Eos: 1 %
Hematocrit: 37.6 % (ref 34.0–46.6)
Hemoglobin: 11.7 g/dL (ref 11.1–15.9)
Immature Grans (Abs): 0 10*3/uL (ref 0.0–0.1)
Immature Granulocytes: 0 %
Lymphocytes Absolute: 1 10*3/uL (ref 0.7–3.1)
Lymphs: 25 %
MCH: 28.4 pg (ref 26.6–33.0)
MCHC: 31.1 g/dL — ABNORMAL LOW (ref 31.5–35.7)
MCV: 91 fL (ref 79–97)
Monocytes Absolute: 0.4 10*3/uL (ref 0.1–0.9)
Monocytes: 10 %
Neutrophils Absolute: 2.5 10*3/uL (ref 1.4–7.0)
Neutrophils: 62 %
Platelets: 246 10*3/uL (ref 150–450)
RBC: 4.12 x10E6/uL (ref 3.77–5.28)
RDW: 14.3 % (ref 11.7–15.4)
WBC: 4.1 10*3/uL (ref 3.4–10.8)

## 2018-09-11 LAB — SEDIMENTATION RATE: Sed Rate: 5 mm/hr (ref 0–32)

## 2018-09-11 LAB — ANA: Anti Nuclear Antibody (ANA): NEGATIVE

## 2018-09-11 LAB — FERRITIN: Ferritin: 11 ng/mL — ABNORMAL LOW (ref 15–150)

## 2018-09-11 LAB — VITAMIN B12: Vitamin B-12: 726 pg/mL (ref 232–1245)

## 2018-09-11 LAB — FOLLICLE STIMULATING HORMONE: FSH: 8.1 m[IU]/mL

## 2018-09-13 DIAGNOSIS — N926 Irregular menstruation, unspecified: Secondary | ICD-10-CM | POA: Insufficient documentation

## 2018-09-13 NOTE — Assessment & Plan Note (Signed)
Recheck cbc and ferritin.   

## 2018-09-13 NOTE — Assessment & Plan Note (Signed)
Followed by GI.  Due f/u EGD.

## 2018-09-13 NOTE — Assessment & Plan Note (Signed)
Stable

## 2018-09-13 NOTE — Assessment & Plan Note (Signed)
Working from home.  Better.  Follow.

## 2018-09-13 NOTE — Assessment & Plan Note (Signed)
Doing yoga and exercising.  With joint pains.  Check esr.  Continue stretches.  Follow.

## 2018-09-13 NOTE — Assessment & Plan Note (Signed)
Some spotting between periods as outlined.  Just has happened recently.  Will follow periods.  Keep menstrual diarty.  Call with update in 1-2 months.  Follow.

## 2018-09-17 ENCOUNTER — Ambulatory Visit: Payer: BC Managed Care – PPO | Admitting: Internal Medicine

## 2019-03-15 ENCOUNTER — Telehealth: Payer: Self-pay | Admitting: Internal Medicine

## 2019-03-15 NOTE — Telephone Encounter (Signed)
Left two message for pt to call back and go over travel screening for CPE  on 03/17/19

## 2019-03-17 ENCOUNTER — Other Ambulatory Visit
Admission: RE | Admit: 2019-03-17 | Discharge: 2019-03-17 | Disposition: A | Discharge status: Home / Self Care | Payer: BC Managed Care – PPO | Attending: Internal Medicine | Admitting: Internal Medicine

## 2019-03-17 ENCOUNTER — Ambulatory Visit (INDEPENDENT_AMBULATORY_CARE_PROVIDER_SITE_OTHER): Payer: BC Managed Care – PPO | Admitting: Internal Medicine

## 2019-03-17 ENCOUNTER — Other Ambulatory Visit: Payer: Self-pay | Admitting: Internal Medicine

## 2019-03-17 ENCOUNTER — Other Ambulatory Visit: Payer: Self-pay

## 2019-03-17 VITALS — BP 110/64 | HR 70 | Temp 97.8°F | Resp 16 | Ht 67.0 in | Wt 142.8 lb

## 2019-03-17 DIAGNOSIS — D72819 Decreased white blood cell count, unspecified: Secondary | ICD-10-CM

## 2019-03-17 DIAGNOSIS — D649 Anemia, unspecified: Secondary | ICD-10-CM | POA: Insufficient documentation

## 2019-03-17 DIAGNOSIS — Z1231 Encounter for screening mammogram for malignant neoplasm of breast: Secondary | ICD-10-CM | POA: Diagnosis not present

## 2019-03-17 DIAGNOSIS — F439 Reaction to severe stress, unspecified: Secondary | ICD-10-CM

## 2019-03-17 DIAGNOSIS — Z0000 Encounter for general adult medical examination without abnormal findings: Secondary | ICD-10-CM

## 2019-03-17 DIAGNOSIS — M542 Cervicalgia: Secondary | ICD-10-CM

## 2019-03-17 DIAGNOSIS — K2270 Barrett's esophagus without dysplasia: Secondary | ICD-10-CM

## 2019-03-17 DIAGNOSIS — Z86010 Personal history of colonic polyps: Secondary | ICD-10-CM

## 2019-03-17 LAB — FERRITIN: Ferritin: 8 ng/mL — ABNORMAL LOW (ref 11–307)

## 2019-03-17 LAB — COMPREHENSIVE METABOLIC PANEL
ALT: 26 U/L (ref 0–44)
AST: 27 U/L (ref 15–41)
Albumin: 4.3 g/dL (ref 3.5–5.0)
Alkaline Phosphatase: 59 U/L (ref 38–126)
Anion gap: 13 (ref 5–15)
BUN: 9 mg/dL (ref 6–20)
CO2: 21 mmol/L — ABNORMAL LOW (ref 22–32)
Calcium: 9.1 mg/dL (ref 8.9–10.3)
Chloride: 103 mmol/L (ref 98–111)
Creatinine, Ser: 0.66 mg/dL (ref 0.44–1.00)
GFR calc Af Amer: >60 mL/min (ref >60)
GFR calc non Af Amer: >60 mL/min (ref >60)
Glucose, Bld: 91 mg/dL (ref 70–99)
Potassium: 4.3 mmol/L (ref 3.5–5.1)
Sodium: 137 mmol/L (ref 135–145)
Total Bilirubin: 0.9 mg/dL (ref 0.3–1.2)
Total Protein: 7.2 g/dL (ref 6.5–8.1)

## 2019-03-17 LAB — CBC WITH DIFFERENTIAL/PLATELET
Abs Immature Granulocytes: 0.02 10*3/uL (ref 0.00–0.07)
Basophils Absolute: 0.1 10*3/uL (ref 0.0–0.1)
Basophils Relative: 1 %
Eosinophils Absolute: 0 10*3/uL (ref 0.0–0.5)
Eosinophils Relative: 1 %
HCT: 39 % (ref 36.0–46.0)
Hemoglobin: 12.8 g/dL (ref 12.0–15.0)
Immature Granulocytes: 1 %
Lymphocytes Relative: 23 %
Lymphs Abs: 0.9 10*3/uL (ref 0.7–4.0)
MCH: 29.5 pg (ref 26.0–34.0)
MCHC: 32.8 g/dL (ref 30.0–36.0)
MCV: 89.9 fL (ref 80.0–100.0)
Monocytes Absolute: 0.6 10*3/uL (ref 0.1–1.0)
Monocytes Relative: 14 %
Neutro Abs: 2.4 10*3/uL (ref 1.7–7.7)
Neutrophils Relative %: 60 %
Platelets: 247 10*3/uL (ref 150–400)
RBC: 4.34 MIL/uL (ref 3.87–5.11)
RDW: 13.5 % (ref 11.5–15.5)
WBC: 3.9 10*3/uL — ABNORMAL LOW (ref 4.0–10.5)
nRBC: 0 % (ref 0.0–0.2)

## 2019-03-17 LAB — IRON AND TIBC
Iron: 102 ug/dL (ref 28–170)
Saturation Ratios: 22 % (ref 10.4–31.8)
TIBC: 473 ug/dL — ABNORMAL HIGH (ref 250–450)
UIBC: 371 ug/dL

## 2019-03-17 NOTE — Progress Notes (Signed)
Patient ID: Azarria Balint Kopf, female   DOB: 03/31/1970, 49 y.o.   MRN: [id number]   Subjective:    Patient ID: Janeann Forehand Wareing, female    DOB: 1970/01/23, 49 y.o.   MRN: [id number]  HPI This visit occurred during the SARS-CoV-2 public health emergency.  Safety protocols were in place, including screening questions prior to the visit, additional usage of staff PPE, and extensive cleaning of exam room while observing appropriate contact time as indicated for disinfecting solutions.  Patient here for her physical exam. She reports she is doing relatively well.  Working from home.  Stress is better with this.  Stays active.  No chest pain or sob reported.  No abdominal pain.  Bowels moving.  No break through bleeding.  Eating and drinking well.  Has a history of Barretts.  Due f/u with GI. Discussed with her today. She is agreeable for referral. Sister just diagnosed with breast cancer.  Brother and his family - diagnosed with covid this week.  States has not been around them in the last two weeks.  Currently with no symptoms - no fever, cough or congestion,etc - reported.      Past Medical History:  Diagnosis Date  . Anemia   . Hypoglycemia   . Iron deficiency   . Migraine   . Ovarian cyst    Past Surgical History:  Procedure Laterality Date  . BREAST CYST ASPIRATION Left 1998   Family History  Problem Relation Age of Onset  . Hypertension Mother        migraines  . Cancer Father        colon and a hx of skin  . Rheum arthritis Brother   . Osteoporosis Paternal Grandmother   . Ulcers Paternal Grandfather   . Breast cancer Maternal Aunt 70  . Breast cancer Paternal Aunt    Social History   Socioeconomic History  . Marital status: Married    Spouse name: Not on file  . Number of children: 0  . Years of education: Not on file  . Highest education level: Not on file  Occupational History    Employer: [employer]  Tobacco Use  . Smoking status: Never Smoker  . Smokeless tobacco:  Never Used  Substance and Sexual Activity  . Alcohol use: Yes    Alcohol/week: 0.0 standard drinks  . Drug use: No  . Sexual activity: Not on file  Other Topics Concern  . Not on file  Social History Narrative  . Not on file   Social Determinants of Health   Financial Resource Strain:   . Difficulty of Paying Living Expenses: Not on file  Food Insecurity:   . Worried About Programme researcher, broadcasting/film/video in the Last Year: Not on file  . Ran Out of Food in the Last Year: Not on file  Transportation Needs:   . Lack of Transportation (Medical): Not on file  . Lack of Transportation (Non-Medical): Not on file  Physical Activity:   . Days of Exercise per Week: Not on file  . Minutes of Exercise per Session: Not on file  Stress:   . Feeling of Stress : Not on file  Social Connections:   . Frequency of Communication with Friends and Family: Not on file  . Frequency of Social Gatherings with Friends and Family: Not on file  . Attends Religious Services: Not on file  . Active Member of Clubs or Organizations: Not on file  . Attends Banker Meetings: Not on  file  . Marital Status: Not on file    Outpatient Encounter Medications as of 03/17/2019  Medication Sig  . ALPRAZolam (XANAX) 0.25 MG tablet Take 1 tablet (0.25 mg total) by mouth at bedtime as needed for sleep.  . cyanocobalamin 2000 MCG tablet Take 2,000 mcg by mouth daily.  . metaxalone (SKELAXIN) 800 MG tablet TAKE 1/2 TO 1 TABLET BY MOUTH AT BEDTIME AS NEEDED FOR BACK SPASMS  . Vitamin D, Ergocalciferol, (DRISDOL) 50000 units CAPS capsule Take 1 capsule (50,000 Units total) by mouth every 7 (seven) days.   No facility-administered encounter medications on file as of 03/17/2019.   Review of Systems  Constitutional: Negative for appetite change and unexpected weight change.  HENT: Negative for congestion and sinus pressure.   Eyes: Negative for pain and visual disturbance.  Respiratory: Negative for cough, chest tightness  and shortness of breath.   Cardiovascular: Negative for chest pain, palpitations and leg swelling.  Gastrointestinal: Negative for abdominal pain, diarrhea, nausea and vomiting.  Genitourinary: Negative for difficulty urinating and dysuria.  Musculoskeletal: Negative for joint swelling and myalgias.  Skin: Negative for color change and rash.  Neurological: Negative for dizziness, light-headedness and headaches.  Hematological: Negative for adenopathy. Does not bruise/bleed easily.  Psychiatric/Behavioral: Negative for agitation and dysphoric mood.       Objective:    Physical Exam Constitutional:      General: She is not in acute distress.    Appearance: Normal appearance. She is well-developed.  HENT:     Head: Normocephalic and atraumatic.     Right Ear: External ear normal.     Left Ear: External ear normal.  Eyes:     General: No scleral icterus.       Right eye: No discharge.        Left eye: No discharge.     Conjunctiva/sclera: Conjunctivae normal.  Neck:     Thyroid: No thyromegaly.  Cardiovascular:     Rate and Rhythm: Normal rate and regular rhythm.  Pulmonary:     Effort: No tachypnea, accessory muscle usage or respiratory distress.     Breath sounds: Normal breath sounds. No decreased breath sounds or wheezing.  Chest:     Breasts:        Right: No inverted nipple, mass, nipple discharge or tenderness (no axillary adenopathy).        Left: No inverted nipple, mass, nipple discharge or tenderness (no axilarry adenopathy).  Abdominal:     General: Bowel sounds are normal.     Palpations: Abdomen is soft.     Tenderness: There is no abdominal tenderness.  Musculoskeletal:        General: No swelling or tenderness.     Cervical back: Neck supple. No tenderness.  Lymphadenopathy:     Cervical: No cervical adenopathy.  Skin:    Findings: No erythema or rash.  Neurological:     Mental Status: She is alert and oriented to person, place, and time.  Psychiatric:         Mood and Affect: Mood normal.        Behavior: Behavior normal.     BP 110/64   Pulse 70   Temp 97.8 F (36.6 C)   Resp 16   Ht 5\' 7"  (1.702 m)   Wt 142 lb 12.8 oz (64.8 kg)   SpO2 99%   BMI 22.37 kg/m  Wt Readings from Last 3 Encounters:  03/17/19 142 lb 12.8 oz (64.8 kg)  03/11/18 145 lb  6.4 oz (66 kg)  01/19/18 142 lb 9.6 oz (64.7 kg)     Lab Results  Component Value Date   WBC 3.9 (L) 03/17/2019   HGB 12.8 03/17/2019   HCT 39.0 03/17/2019   PLT 247 03/17/2019   GLUCOSE 91 03/17/2019   CHOL 173 09/10/2018   TRIG 36 09/10/2018   HDL 102 09/10/2018   LDLCALC 64 09/10/2018   ALT 26 03/17/2019   AST 27 03/17/2019   NA 137 03/17/2019   K 4.3 03/17/2019   CL 103 03/17/2019   CREATININE 0.66 03/17/2019   BUN 9 03/17/2019   CO2 21 (L) 03/17/2019   TSH 1.380 09/10/2018   HGBA1C 5.4 11/02/2015       Assessment & Plan:   Problem List Items Addressed This Visit    Anemia   Relevant Orders   CBC with Differential/Platelet (Completed)   Comprehensive metabolic panel (Completed)   Ferritin (Completed)   Iron and TIBC (Completed)   Barrett's esophagus    Followed by GI.  Upper symptoms controlled.  On no medication.  Refer to GI - due EGD.       Relevant Orders   Ambulatory referral to Gastroenterology   Health care maintenance    Physical today 03/17/19.  PAP 03/11/18 - negative with negative HPV.  Due mammogram.  Scheduled.  Refer to GI - anemia.  Will need f/u EGD and colonoscopy.        Leukopenia    Follow cbc.        Neck pain    Stable.  Not reported as a significant issue today.        Stress    Working from home.  Doing better.  Follow.         Other Visit Diagnoses    Visit for screening mammogram    -  Primary   Relevant Orders   MM 3D SCREEN BREAST BILATERAL   History of colon polyps       Relevant Orders   Ambulatory referral to Gastroenterology       Einar Pheasant, MD

## 2019-03-17 NOTE — Progress Notes (Signed)
Order placed for cbc/ferritin

## 2019-03-17 NOTE — Assessment & Plan Note (Addendum)
Physical today 03/17/19.  PAP 03/11/18 - negative with negative HPV.  Due mammogram.  Scheduled.  Refer to GI - anemia.  Will need f/u EGD and colonoscopy.

## 2019-03-18 ENCOUNTER — Telehealth: Payer: Self-pay | Admitting: Internal Medicine

## 2019-03-18 NOTE — Telephone Encounter (Signed)
See result note.  

## 2019-03-18 NOTE — Telephone Encounter (Signed)
Please call pt about test results. She said Shanda Bumps left a message for her to call back.

## 2019-03-20 ENCOUNTER — Encounter: Payer: Self-pay | Admitting: Internal Medicine

## 2019-03-20 NOTE — Assessment & Plan Note (Addendum)
Follow cbc.  

## 2019-03-20 NOTE — Assessment & Plan Note (Signed)
Stable.  Not reported as a significant issue today.

## 2019-03-20 NOTE — Assessment & Plan Note (Signed)
Working from home.  Doing better.  Follow.

## 2019-03-20 NOTE — Assessment & Plan Note (Signed)
Followed by GI.  Upper symptoms controlled.  On no medication.  Refer to GI - due EGD.

## 2019-03-22 NOTE — Addendum Note (Signed)
Addended by: Charm Barges on: 03/22/2019 01:11 AM   Modules accepted: Level of Service

## 2019-03-23 ENCOUNTER — Inpatient Hospital Stay: Admission: RE | Admit: 2019-03-23 | Payer: BC Managed Care – PPO | Source: Ambulatory Visit

## 2019-04-13 ENCOUNTER — Ambulatory Visit
Admission: RE | Admit: 2019-04-13 | Discharge: 2019-04-13 | Disposition: A | Discharge status: Home / Self Care | Payer: BC Managed Care – PPO | Source: Ambulatory Visit | Attending: Internal Medicine | Admitting: Internal Medicine

## 2019-04-13 ENCOUNTER — Other Ambulatory Visit: Payer: Self-pay | Admitting: Internal Medicine

## 2019-04-13 ENCOUNTER — Other Ambulatory Visit: Payer: Self-pay

## 2019-04-13 DIAGNOSIS — N6320 Unspecified lump in the left breast, unspecified quadrant: Secondary | ICD-10-CM

## 2019-04-13 DIAGNOSIS — R928 Other abnormal and inconclusive findings on diagnostic imaging of breast: Secondary | ICD-10-CM

## 2019-04-13 DIAGNOSIS — Z1231 Encounter for screening mammogram for malignant neoplasm of breast: Secondary | ICD-10-CM | POA: Diagnosis not present

## 2019-04-15 ENCOUNTER — Telehealth: Payer: Self-pay | Admitting: Internal Medicine

## 2019-04-15 NOTE — Telephone Encounter (Signed)
Pt is returning your call

## 2019-04-15 NOTE — Telephone Encounter (Signed)
See results note. 

## 2019-04-19 ENCOUNTER — Encounter: Payer: Self-pay | Admitting: Internal Medicine

## 2019-04-27 ENCOUNTER — Ambulatory Visit
Admission: RE | Admit: 2019-04-27 | Discharge: 2019-04-27 | Disposition: A | Discharge status: Home / Self Care | Payer: BC Managed Care – PPO | Source: Ambulatory Visit | Attending: Internal Medicine | Admitting: Internal Medicine

## 2019-04-27 DIAGNOSIS — R928 Other abnormal and inconclusive findings on diagnostic imaging of breast: Secondary | ICD-10-CM

## 2019-04-27 DIAGNOSIS — N6320 Unspecified lump in the left breast, unspecified quadrant: Secondary | ICD-10-CM | POA: Diagnosis present

## 2019-04-29 ENCOUNTER — Other Ambulatory Visit (INDEPENDENT_AMBULATORY_CARE_PROVIDER_SITE_OTHER): Payer: BC Managed Care – PPO

## 2019-04-29 ENCOUNTER — Other Ambulatory Visit: Payer: Self-pay

## 2019-04-29 DIAGNOSIS — D649 Anemia, unspecified: Secondary | ICD-10-CM

## 2019-04-29 LAB — CBC WITH DIFFERENTIAL/PLATELET
Basophils Absolute: 0.1 10*3/uL (ref 0.0–0.1)
Basophils Relative: 1.6 % (ref 0.0–3.0)
Eosinophils Absolute: 0 10*3/uL (ref 0.0–0.7)
Eosinophils Relative: 1.1 % (ref 0.0–5.0)
HCT: 36.6 % (ref 36.0–46.0)
Hemoglobin: 12.3 g/dL (ref 12.0–15.0)
Lymphocytes Relative: 19.9 % (ref 12.0–46.0)
Lymphs Abs: 0.9 10*3/uL (ref 0.7–4.0)
MCHC: 33.6 g/dL (ref 30.0–36.0)
MCV: 88.5 fl (ref 78.0–100.0)
Monocytes Absolute: 0.6 10*3/uL (ref 0.1–1.0)
Monocytes Relative: 12.3 % — ABNORMAL HIGH (ref 3.0–12.0)
Neutro Abs: 2.9 10*3/uL (ref 1.4–7.7)
Neutrophils Relative %: 65.1 % (ref 43.0–77.0)
Platelets: 246 10*3/uL (ref 150.0–400.0)
RBC: 4.14 Mil/uL (ref 3.87–5.11)
RDW: 14.3 % (ref 11.5–15.5)
WBC: 4.5 10*3/uL (ref 4.0–10.5)

## 2019-04-29 LAB — FERRITIN: Ferritin: 8.8 ng/mL — ABNORMAL LOW (ref 10.0–291.0)

## 2019-06-10 ENCOUNTER — Other Ambulatory Visit: Payer: Self-pay

## 2019-06-10 ENCOUNTER — Ambulatory Visit (INDEPENDENT_AMBULATORY_CARE_PROVIDER_SITE_OTHER): Payer: BC Managed Care – PPO

## 2019-06-10 ENCOUNTER — Encounter: Payer: Self-pay | Admitting: Internal Medicine

## 2019-06-10 ENCOUNTER — Ambulatory Visit (INDEPENDENT_AMBULATORY_CARE_PROVIDER_SITE_OTHER): Payer: BC Managed Care – PPO | Admitting: Internal Medicine

## 2019-06-10 VITALS — BP 110/80 | HR 72 | Temp 97.9°F | Ht 67.0 in | Wt 143.8 lb

## 2019-06-10 DIAGNOSIS — L03012 Cellulitis of left finger: Secondary | ICD-10-CM | POA: Diagnosis not present

## 2019-06-10 DIAGNOSIS — M79645 Pain in left finger(s): Secondary | ICD-10-CM

## 2019-06-10 MED ORDER — MUPIROCIN 2 % EX OINT: 1.0000 "application " | TOPICAL_OINTMENT | Freq: Two times a day (BID) | CUTANEOUS | 0 refills | Status: DC

## 2019-06-10 NOTE — Progress Notes (Signed)
Chief Complaint  Patient presents with  . Finger Injury    injured the middle finger of the left hand 3 weeks ago in a walking accident with her german shephard pulling the leash. States there is still pain swelling and numbness. Pain 3/10.    F/u  1. 05/20/19 in training with 83 lb german sheppard and he went to attack another dog and leash wrapped around left middle finger and was swollen painful, red purple tried ibuprofen, ice, epsolm salt soaks but upper portion of finger is still red and painful 3/10. She had a blood blister which she drained with a sterile needle and this has resolved but the finger remains red and mildly painful    Review of Systems  Respiratory: Negative for shortness of breath.   Cardiovascular: Negative for chest pain.  Musculoskeletal: Positive for joint pain.   Past Medical History:  Diagnosis Date  . Anemia   . Hypoglycemia   . Iron deficiency   . Migraine   . Ovarian cyst    Past Surgical History:  Procedure Laterality Date  . BREAST CYST ASPIRATION Left 1998   Family History  Problem Relation Age of Onset  . Hypertension Mother        migraines  . Cancer Father        colon and a hx of skin  . Breast cancer Sister   . Rheum arthritis Brother   . Osteoporosis Paternal Grandmother   . Ulcers Paternal Grandfather   . Breast cancer Maternal Aunt 70  . Breast cancer Paternal Aunt    Social History   Socioeconomic History  . Marital status: Married    Spouse name: Not on file  . Number of children: 0  . Years of education: Not on file  . Highest education level: Not on file  Occupational History    Employer: [employer]  Tobacco Use  . Smoking status: Never Smoker  . Smokeless tobacco: Never Used  Substance and Sexual Activity  . Alcohol use: Yes    Alcohol/week: 0.0 standard drinks  . Drug use: No  . Sexual activity: Not on file  Other Topics Concern  . Not on file  Social History Narrative  . Not on file   Social Determinants of Health    Financial Resource Strain:   . Difficulty of Paying Living Expenses:   Food Insecurity:   . Worried About Programme researcher, broadcasting/film/video in the Last Year:   . Barista in the Last Year:   Transportation Needs:   . Freight forwarder (Medical):   Marland Kitchen Lack of Transportation (Non-Medical):   Physical Activity:   . Days of Exercise per Week:   . Minutes of Exercise per Session:   Stress:   . Feeling of Stress :   Social Connections:   . Frequency of Communication with Friends and Family:   . Frequency of Social Gatherings with Friends and Family:   . Attends Religious Services:   . Active Member of Clubs or Organizations:   . Attends Banker Meetings:   Marland Kitchen Marital Status:   Intimate Partner Violence:   . Fear of Current or Ex-Partner:   . Emotionally Abused:   Marland Kitchen Physically Abused:   . Sexually Abused:    Current Meds  Medication Sig  . cyanocobalamin 2000 MCG tablet Take 2,000 mcg by mouth daily.  . Ferrous Sulfate (IRON PO) Take by mouth.  . Multiple Vitamin (MULTIVITAMIN) capsule Take 1 capsule by mouth daily.  Marland Kitchen  Vitamin D, Ergocalciferol, (DRISDOL) 50000 units CAPS capsule Take 1 capsule (50,000 Units total) by mouth every 7 (seven) days.   Allergies  Allergen Reactions  . Codeine     Hallucination   . Topamax [Topiramate]    Recent Results (from the past 2160 hour(s))  Iron and TIBC     Status: Abnormal   Collection Time: 03/17/19  9:49 AM  Result Value Ref Range   Iron 102 28 - 170 ug/dL   TIBC 656 (H) 812 - 751 ug/dL   Saturation Ratios 22 10.4 - 31.8 %   UIBC 371 ug/dL    Comment: Performed at [facility], [address] [place], [place] [address]  Ferritin     Status: Abnormal   Collection Time: 03/17/19  9:49 AM  Result Value Ref Range   Ferritin 8 (L) 11 - 307 ng/mL    Comment: Performed at [facility], [address] [place], [place] [address]  Comprehensive metabolic panel     Status: Abnormal   Collection Time:  03/17/19  9:49 AM  Result Value Ref Range   Sodium 137 135 - 145 mmol/L   Potassium 4.3 3.5 - 5.1 mmol/L   Chloride 103 98 - 111 mmol/L   CO2 21 (L) 22 - 32 mmol/L   Glucose, Bld 91 70 - 99 mg/dL    Comment: Glucose reference range applies only to samples taken after fasting for at least 8 hours.   BUN 9 6 - 20 mg/dL   Creatinine, Ser 6.75 0.44 - 1.00 mg/dL   Calcium 9.1 8.9 - 91.6 mg/dL   Total Protein 7.2 6.5 - 8.1 g/dL   Albumin 4.3 3.5 - 5.0 g/dL   AST 27 15 - 41 U/L   ALT 26 0 - 44 U/L   Alkaline Phosphatase 59 38 - 126 U/L   Total Bilirubin 0.9 0.3 - 1.2 mg/dL   GFR calc non Af Amer >60 >60 mL/min   GFR calc Af Amer >60 >60 mL/min   Anion gap 13 5 - 15    Comment: Performed at [facility], [address] [place], [place] [address]  CBC with Differential/Platelet     Status: Abnormal   Collection Time: 03/17/19  9:49 AM  Result Value Ref Range   WBC 3.9 (L) 4.0 - 10.5 K/uL   RBC 4.34 3.87 - 5.11 MIL/uL   Hemoglobin 12.8 12.0 - 15.0 g/dL   HCT 59.9 35.7 - 01.7 %   MCV 89.9 80.0 - 100.0 fL   MCH 29.5 26.0 - 34.0 pg   MCHC 32.8 30.0 - 36.0 g/dL   RDW 79.3 90.3 - 00.9 %   Platelets 247 150 - 400 K/uL   nRBC 0.0 0.0 - 0.2 %   Neutrophils Relative % 60 %   Neutro Abs 2.4 1.7 - 7.7 K/uL   Lymphocytes Relative 23 %   Lymphs Abs 0.9 0.7 - 4.0 K/uL   Monocytes Relative 14 %   Monocytes Absolute 0.6 0.1 - 1.0 K/uL   Eosinophils Relative 1 %   Eosinophils Absolute 0.0 0.0 - 0.5 K/uL   Basophils Relative 1 %   Basophils Absolute 0.1 0.0 - 0.1 K/uL   Immature Granulocytes 1 %   Abs Immature Granulocytes 0.02 0.00 - 0.07 K/uL    Comment: Performed at [facility], [address], [place], [place] [address]  CBC with Differential/Platelet     Status: Abnormal   Collection Time: 04/29/19 11:23 AM  Result  Value Ref Range   WBC 4.5 4.0 - 10.5 K/uL   RBC 4.14 3.87 - 5.11 Mil/uL   Hemoglobin 12.3 12.0 - 15.0 g/dL   HCT 40.9 81.1 - 91.4 %   MCV 88.5 78.0 -  100.0 fl   MCHC 33.6 30.0 - 36.0 g/dL   RDW 78.2 95.6 - 21.3 %   Platelets 246.0 150.0 - 400.0 K/uL   Neutrophils Relative % 65.1 43.0 - 77.0 %   Lymphocytes Relative 19.9 12.0 - 46.0 %   Monocytes Relative 12.3 (H) 3.0 - 12.0 %   Eosinophils Relative 1.1 0.0 - 5.0 %   Basophils Relative 1.6 0.0 - 3.0 %   Neutro Abs 2.9 1.4 - 7.7 K/uL   Lymphs Abs 0.9 0.7 - 4.0 K/uL   Monocytes Absolute 0.6 0.1 - 1.0 K/uL   Eosinophils Absolute 0.0 0.0 - 0.7 K/uL   Basophils Absolute 0.1 0.0 - 0.1 K/uL  Ferritin     Status: Abnormal   Collection Time: 04/29/19 11:23 AM  Result Value Ref Range   Ferritin 8.8 (L) 10.0 - 291.0 ng/mL   Objective  Body mass index is 22.52 kg/m. Wt Readings from Last 3 Encounters:  06/10/19 143 lb 12.8 oz (65.2 kg)  03/17/19 142 lb 12.8 oz (64.8 kg)  03/11/18 145 lb 6.4 oz (66 kg)   Temp Readings from Last 3 Encounters:  06/10/19 97.9 F (36.6 C) (Temporal)  03/17/19 97.8 F (36.6 C)  03/11/18 (!) 97.5 F (36.4 C) (Oral)   BP Readings from Last 3 Encounters:  06/10/19 110/80  03/17/19 110/64  03/11/18 118/72   Pulse Readings from Last 3 Encounters:  06/10/19 72  03/17/19 70  03/11/18 (!) 56    Physical Exam Vitals and nursing note reviewed.  Constitutional:      Appearance: Normal appearance. She is well-developed and well-groomed.  HENT:     Head: Normocephalic and atraumatic.  Eyes:     Conjunctiva/sclera: Conjunctivae normal.     Pupils: Pupils are equal, round, and reactive to light.  Cardiovascular:     Rate and Rhythm: Normal rate and regular rhythm.     Heart sounds: Normal heart sounds.  Pulmonary:     Effort: Pulmonary effort is normal.     Breath sounds: Normal breath sounds.  Musculoskeletal:       Arms:  Skin:    General: Skin is warm and dry.  Neurological:     General: No focal deficit present.     Mental Status: She is alert and oriented to person, place, and time. Mental status is at baseline.     Gait: Gait normal.   Psychiatric:        Attention and Perception: Attention and perception normal.        Mood and Affect: Mood and affect normal.        Speech: Speech normal.        Behavior: Behavior normal. Behavior is cooperative.        Thought Content: Thought content normal.        Cognition and Memory: Cognition and memory normal.        Judgment: Judgment normal.     Assessment  Plan  Finger pain, left likely MSK strain considered paronychia due trauma 05/20/19 - Plan: DG Finger Middle Left, mupirocin ointment (BACTROBAN) 2 % Consider splinting left middle finger  voltaren gel Prn Tylenol and nsaids  If needed consider hand ortho in future   Paronychia of left middle finger  Warm soaks antibacterial soap and bactroban  Provider: Dr. Olivia Mackie McLean-Scocuzza-Internal Medicine

## 2019-06-10 NOTE — Patient Instructions (Signed)
Consider voltaren gel over the counter  Tylenol  Maybe try to splint middle finger   Paronychia Paronychia is an infection of the skin that surrounds a nail. It usually affects the skin around a fingernail, but it may also occur near a toenail. It often causes pain and swelling around the nail. In some cases, a collection of pus (abscess) can form near or under the nail.  This condition may develop suddenly, or it may develop gradually over a longer period. In most cases, paronychia is not serious, and it will clear up with treatment. What are the causes? This condition may be caused by bacteria or a fungus. These germs can enter the body through an opening in the skin, such as a cut or a hangnail. What increases the risk? This condition is more likely to develop in people who:  Get their hands wet often, such as those who work as Designer, industrial/product, bartenders, or nurses.  Bite their fingernails or suck their thumbs.  Trim their nails very short.  Have hangnails or injured fingertips.  Get manicures.  Have diabetes. What are the signs or symptoms? Symptoms of this condition include:  Redness and swelling of the skin near the nail.  Tenderness around the nail when you touch the area.  Pus-filled bumps under the skin at the base and sides of the nail (cuticle).  Fluid or pus under the nail.  Throbbing pain in the area. How is this diagnosed? This condition is diagnosed with a physical exam. In some cases, a sample of pus may be tested to determine what type of bacteria or fungus is causing the condition. How is this treated? Treatment depends on the cause and severity of your condition. If your condition is mild, it may clear up on its own in a few days or after soaking in warm water. If needed, treatment may include:  Antibiotic medicine, if your infection is caused by bacteria.  Antifungal medicine, if your infection is caused by a fungus.  A procedure to drain pus from an  abscess.  Anti-inflammatory medicine (corticosteroids). Follow these instructions at home: Wound care  Keep the affected area clean.  Soak the affected area in warm water, if told to do so by your health care provider. You may be told to do this for 20 minutes, 2-3 times a day.  Keep the area dry when you are not soaking it.  Do not try to drain an abscess yourself.  Follow instructions from your health care provider about how to take care of the affected area. Make sure you: ? Wash your hands with soap and water before you change your bandage (dressing). If soap and water are not available, use hand sanitizer. ? Change your dressing as told by your health care provider.  If you had an abscess drained, check the area every day for signs of infection. Check for: ? Redness, swelling, or pain. ? Fluid or blood. ? Warmth. ? Pus or a bad smell. Medicines   Take over-the-counter and prescription medicines only as told by your health care provider.  If you were prescribed an antibiotic medicine, take it as told by your health care provider. Do not stop taking the antibiotic even if you start to feel better. General instructions  Avoid contact with harsh chemicals.  Do not pick at the affected area. Prevention  To prevent this condition from happening again: ? Wear rubber gloves when washing dishes or doing other tasks that require your hands to get wet. ?  Wear gloves if your hands might come in contact with cleaners or other chemicals. ? Avoid injuring your nails or fingertips. ? Do not bite your nails or tear hangnails. ? Do not cut your nails very short. ? Do not cut your cuticles. ? Use clean nail clippers or scissors when trimming nails. Contact a health care provider if:  Your symptoms get worse or do not improve with treatment.  You have continued or increased fluid, blood, or pus coming from the affected area.  Your finger or knuckle becomes swollen or difficult to  move. Get help right away if you have:  A fever or chills.  Redness spreading away from the affected area.  Joint or muscle pain. Summary  Paronychia is an infection of the skin that surrounds a nail. It often causes pain and swelling around the nail. In some cases, a collection of pus (abscess) can form near or under the nail.  This condition may be caused by bacteria or a fungus. These germs can enter the body through an opening in the skin, such as a cut or a hangnail.  If your condition is mild, it may clear up on its own in a few days. If needed, treatment may include medicine or a procedure to drain pus from an abscess.  To prevent this condition from happening again, wear gloves if doing tasks that require your hands to get wet or to come in contact with chemicals. Also avoid injuring your nails or fingertips. This information is not intended to replace advice given to you by your health care provider. Make sure you discuss any questions you have with your health care provider. Document Revised: 01/23/2017 Document Reviewed: 01/19/2017 Elsevier Patient Education  2020 [employer].

## 2019-06-11 ENCOUNTER — Telehealth: Payer: Self-pay | Admitting: Internal Medicine

## 2019-06-11 NOTE — Telephone Encounter (Signed)
Pt notified of xray results via my chart.   

## 2019-06-27 ENCOUNTER — Encounter: Payer: Self-pay | Admitting: Internal Medicine

## 2019-06-27 NOTE — Telephone Encounter (Signed)
Pt scheduled with Dr Scott 

## 2019-06-28 ENCOUNTER — Ambulatory Visit (INDEPENDENT_AMBULATORY_CARE_PROVIDER_SITE_OTHER): Payer: BC Managed Care – PPO | Admitting: Internal Medicine

## 2019-06-28 ENCOUNTER — Other Ambulatory Visit: Payer: Self-pay

## 2019-06-28 DIAGNOSIS — F439 Reaction to severe stress, unspecified: Secondary | ICD-10-CM | POA: Diagnosis not present

## 2019-06-28 DIAGNOSIS — G44309 Post-traumatic headache, unspecified, not intractable: Secondary | ICD-10-CM

## 2019-06-28 DIAGNOSIS — N644 Mastodynia: Secondary | ICD-10-CM | POA: Diagnosis not present

## 2019-06-28 NOTE — Progress Notes (Addendum)
Patient ID: Isobella Ascher Atwood, female   DOB: [birth year], 49 y.o.   MRN: [id number]   Subjective:    Patient ID: Carrolyn Meiers Grullon, female    DOB: 1970-02-07, 49 y.o.   MRN: [id number]  HPI This visit occurred during the SARS-CoV-2 public health emergency.  Safety protocols were in place, including screening questions prior to the visit, additional usage of staff PPE, and extensive cleaning of exam room while observing appropriate contact time as indicated for disinfecting solutions.  Patient here for as a work in appt with concerns regarding left breast tenderness.  States started two weeks ago.  Described what felt like a "bee sting".  Also noticed this past weekend - while running - bee sting.  Also noticed with movement of the breast - tripped.  Located 5:00 under nipple.  Radiates around.  No nipple discharge.  No chest pain or sob.  No rash.  No new bra or trauma.  Sister with breast cancer.  She also reports head trauma recently.  Object fell on top of her head.  Noticed since the event - increased headache.  Occurring daily.  She is on the computer a lot and was wondering if this could be contributing to her headaches, but has not had an issue until after the trauma.  Does have history of neck issues, but neck has been stable.    Past Medical History:  Diagnosis Date  . Anemia   . Hypoglycemia   . Iron deficiency   . Migraine   . Ovarian cyst    Past Surgical History:  Procedure Laterality Date  . BREAST CYST ASPIRATION Left 1998   Family History  Problem Relation Age of Onset  . Hypertension Mother        migraines  . Cancer Father        colon and a hx of skin  . Breast cancer Sister   . Rheum arthritis Brother   . Osteoporosis Paternal Grandmother   . Ulcers Paternal Grandfather   . Breast cancer Maternal Aunt 70  . Breast cancer Paternal Aunt    Social History   Socioeconomic History  . Marital status: Married    Spouse name: Not on file  . Number of children: 0  .  Years of education: Not on file  . Highest education level: Not on file  Occupational History    Employer: [employer]  Tobacco Use  . Smoking status: Never Smoker  . Smokeless tobacco: Never Used  Substance and Sexual Activity  . Alcohol use: Yes    Alcohol/week: 0.0 standard drinks  . Drug use: No  . Sexual activity: Not on file  Other Topics Concern  . Not on file  Social History Narrative  . Not on file   Social Determinants of Health   Financial Resource Strain:   . Difficulty of Paying Living Expenses:   Food Insecurity:   . Worried About Charity fundraiser in the Last Year:   . Arboriculturist in the Last Year:   Transportation Needs:   . Film/video editor (Medical):   Marland Kitchen Lack of Transportation (Non-Medical):   Physical Activity:   . Days of Exercise per Week:   . Minutes of Exercise per Session:   Stress:   . Feeling of Stress :   Social Connections:   . Frequency of Communication with Friends and Family:   . Frequency of Social Gatherings with Friends and Family:   . Attends Religious Services:   .  Active Member of Clubs or Organizations:   . Attends Banker Meetings:   Marland Kitchen Marital Status:     Outpatient Encounter Medications as of 06/28/2019  Medication Sig  . cyanocobalamin 2000 MCG tablet Take 2,000 mcg by mouth daily.  . Ferrous Sulfate (IRON PO) Take by mouth.  . metaxalone (SKELAXIN) 800 MG tablet TAKE 1/2 TO 1 TABLET BY MOUTH AT BEDTIME AS NEEDED FOR BACK SPASMS (Patient not taking: Reported on 06/10/2019)  . Multiple Vitamin (MULTIVITAMIN) capsule Take 1 capsule by mouth daily.  . mupirocin ointment (BACTROBAN) 2 % Apply 1 application topically 2 (two) times daily. Left middle finger as needed  . Vitamin D, Ergocalciferol, (DRISDOL) 50000 units CAPS capsule Take 1 capsule (50,000 Units total) by mouth every 7 (seven) days.  . [DISCONTINUED] ALPRAZolam (XANAX) 0.25 MG tablet Take 1 tablet (0.25 mg total) by mouth at bedtime as needed for sleep.  (Patient not taking: Reported on 06/10/2019)   No facility-administered encounter medications on file as of 06/28/2019.   Review of Systems  Constitutional: Negative for appetite change and unexpected weight change.  Respiratory: Negative for cough, chest tightness and shortness of breath.   Cardiovascular: Negative for chest pain.  Gastrointestinal: Negative for diarrhea, nausea and vomiting.  Musculoskeletal: Negative for joint swelling and myalgias.  Skin: Negative for color change and rash.       Objective:    Physical Exam Vitals reviewed.  Constitutional:      General: She is not in acute distress.    Appearance: Normal appearance.  HENT:     Head: Normocephalic and atraumatic.  Neck:     Thyroid: No thyromegaly.  Cardiovascular:     Rate and Rhythm: Normal rate and regular rhythm.  Pulmonary:     Effort: No respiratory distress.     Breath sounds: Normal breath sounds. No wheezing.     Comments: Breasts:  No nipple discharge or nipple retraction present.  Question of fullness - 4-5:00 left breast.  No axillary adenopathy.   Abdominal:     General: Bowel sounds are normal.     Palpations: Abdomen is soft.     Tenderness: There is no abdominal tenderness.  Musculoskeletal:     Cervical back: Neck supple. No tenderness.  Lymphadenopathy:     Cervical: No cervical adenopathy.  Skin:    Findings: No erythema or rash.  Neurological:     Mental Status: She is alert.     BP 118/60   Pulse 78   Temp (!) 97.2 F (36.2 C)   Resp 16   Wt 142 lb 1.6 oz (64.5 kg)   LMP 06/09/2019   SpO2 99%   BMI 22.26 kg/m  Wt Readings from Last 3 Encounters:  06/28/19 142 lb 1.6 oz (64.5 kg)  06/10/19 143 lb 12.8 oz (65.2 kg)  03/17/19 142 lb 12.8 oz (64.8 kg)     Lab Results  Component Value Date   WBC 4.5 04/29/2019   HGB 12.3 04/29/2019   HCT 36.6 04/29/2019   PLT 246.0 04/29/2019   GLUCOSE 91 03/17/2019   CHOL 173 09/10/2018   TRIG 36 09/10/2018   HDL 102 09/10/2018    LDLCALC 64 09/10/2018   ALT 26 03/17/2019   AST 27 03/17/2019   NA 137 03/17/2019   K 4.3 03/17/2019   CL 103 03/17/2019   CREATININE 0.66 03/17/2019   BUN 9 03/17/2019   CO2 21 (L) 03/17/2019   TSH 1.380 09/10/2018   HGBA1C 5.4 11/02/2015  MM DIAG BREAST TOMO UNI LEFT  Result Date: 04/27/2019 CLINICAL DATA:  Patient was called back from screening mammogram for a possible mass in the left breast. EXAM: DIGITAL DIAGNOSTIC UNILATERAL LEFT MAMMOGRAM WITH CAD AND TOMO COMPARISON:  Previous exam(s). ACR Breast Density Category d: The breast tissue is extremely dense, which lowers the sensitivity of mammography. FINDINGS: Additional imaging of the left breast was performed. There is no persistent mass, distortion or malignant type microcalcifications. Mammographic images were processed with CAD. IMPRESSION: No evidence of malignancy in the left breast. RECOMMENDATION: Bilateral screening mammogram in 1 year is recommended. I have discussed the findings and recommendations with the patient. If applicable, a reminder letter will be sent to the patient regarding the next appointment. BI-RADS CATEGORY  1: Negative. Electronically Signed   By: Baird Lyons M.D.   On: 04/27/2019 09:45       Assessment & Plan:   Problem List Items Addressed This Visit    Breast tenderness    Left breast tenderness as outlined.  Just had left mammogram.  Given tenderness and fullness, will have surgery evaluate.  Pt comfortable with this plan.        Relevant Orders   Ambulatory referral to General Surgery   Headache    Headache noticed after trauma as outlined.  Discussed scanning.  She requested to cut back on computer time, etc and see if headache improves.    Addendum:  Reported persistent headache after trauma despite cutting back on computer time.  Has also noticed when driving, etc.  Starts at the top of her head and radiates down side of head.  Does not have a history of headaches and this headache did  not start until after her head trauma.  Has been persistent since 06/21/19 and worsened recently.  Discussed scanning her head. She is in agreement.  Will place order for MRI to further evaluate.        Stress    Working from home.  Handling stress.  Follow.            Dale Devens, MD

## 2019-06-30 ENCOUNTER — Encounter: Payer: Self-pay | Admitting: Internal Medicine

## 2019-06-30 DIAGNOSIS — G44309 Post-traumatic headache, unspecified, not intractable: Secondary | ICD-10-CM

## 2019-07-01 ENCOUNTER — Telehealth: Payer: Self-pay | Admitting: Internal Medicine

## 2019-07-01 NOTE — Telephone Encounter (Signed)
Rejection Reason - Other - 06/22/19 Per provider, "Chart reviewed in preparation for today's visit. Appointment notes indicate she is coming in for Barrett's esophagus and a history of colon polyps. She has a h/o Barrett's due for a repeat colonoscopy in 2012, but no evidence of Barrett's was seen on subsequent EGDs in 2014 or 2018. Therefore, a repeat for continued surveillance is not recommended at this time. She also had her last colonoscopy in 2018, and is due for a repeat in 2023 per Dr. Earnest Conroy recommendation. Per last note from Dr. Lorin Picket on 03/17/19, she was not having any GI symptoms. Lab review indicates she has chronic IDA, which is stable." Per patient in response, "Spoke with patient and advised of information as below per [facility]. She states that she is not having any GI symptoms at this time and is agreeable to cancel today's GI appointment as she is not due for repeat procedures. Advised I would send this information to Dr. Lorin Picket as well. She was advised that our office will reach out to her when due for repeat procedures, however to notify the office sooner should any GI complaints arise in the interim. She verbalized understanding to all information provided today."" [facility] said 3 days ago

## 2019-07-03 ENCOUNTER — Encounter: Payer: Self-pay | Admitting: Internal Medicine

## 2019-07-03 DIAGNOSIS — N644 Mastodynia: Secondary | ICD-10-CM | POA: Insufficient documentation

## 2019-07-03 NOTE — Assessment & Plan Note (Signed)
Working from home.  Handling stress.  Follow.   

## 2019-07-03 NOTE — Assessment & Plan Note (Signed)
Left breast tenderness as outlined.  Just had left mammogram.  Given tenderness and fullness, will have surgery evaluate.  Pt comfortable with this plan.

## 2019-07-05 MED ORDER — ALPRAZOLAM 0.25 MG PO TABS: 0.2500 mg | ORAL_TABLET | Freq: Every day | ORAL | 0 refills | Status: AC | PRN

## 2019-07-07 DIAGNOSIS — R519 Headache, unspecified: Secondary | ICD-10-CM | POA: Insufficient documentation

## 2019-07-07 NOTE — Assessment & Plan Note (Signed)
Headache noticed after trauma as outlined.  Discussed scanning.  She requested to cut back on computer time, etc and see if headache improves.    Addendum:  Reported persistent headache after trauma despite cutting back on computer time.  Has also noticed when driving, etc.  Starts at the top of her head and radiates down side of head.  Does not have a history of headaches and this headache did not start until after her head trauma.  Has been persistent since 06/21/19 and worsened recently.  Discussed scanning her head. She is in agreement.  Will place order for MRI to further evaluate.

## 2019-07-07 NOTE — Telephone Encounter (Signed)
Order placed for MRI. Pt with persistent headache s/p trauma - object hitting top of her head.  Daily headache.

## 2019-07-08 ENCOUNTER — Telehealth: Payer: Self-pay | Admitting: Internal Medicine

## 2019-07-08 NOTE — Telephone Encounter (Signed)
Left vm for pt to call ofc to sch MRI. 

## 2019-07-11 ENCOUNTER — Telehealth: Payer: Self-pay

## 2019-07-11 DIAGNOSIS — G44309 Post-traumatic headache, unspecified, not intractable: Secondary | ICD-10-CM

## 2019-07-11 NOTE — Telephone Encounter (Signed)
Order changed for MRI

## 2019-07-13 ENCOUNTER — Other Ambulatory Visit: Payer: Self-pay | Admitting: General Surgery

## 2019-07-13 DIAGNOSIS — N644 Mastodynia: Secondary | ICD-10-CM

## 2019-07-19 ENCOUNTER — Ambulatory Visit
Admission: RE | Admit: 2019-07-19 | Discharge: 2019-07-19 | Disposition: A | Discharge status: Home / Self Care | Payer: BC Managed Care – PPO | Source: Ambulatory Visit | Attending: General Surgery | Admitting: General Surgery

## 2019-07-19 DIAGNOSIS — N644 Mastodynia: Secondary | ICD-10-CM

## 2019-07-20 ENCOUNTER — Other Ambulatory Visit: Payer: Self-pay | Admitting: General Surgery

## 2019-07-20 DIAGNOSIS — R928 Other abnormal and inconclusive findings on diagnostic imaging of breast: Secondary | ICD-10-CM

## 2019-07-20 DIAGNOSIS — N6320 Unspecified lump in the left breast, unspecified quadrant: Secondary | ICD-10-CM

## 2019-07-27 ENCOUNTER — Other Ambulatory Visit: Payer: Self-pay

## 2019-07-27 ENCOUNTER — Ambulatory Visit
Admission: RE | Admit: 2019-07-27 | Discharge: 2019-07-27 | Disposition: A | Discharge status: Home / Self Care | Payer: BC Managed Care – PPO | Source: Ambulatory Visit | Attending: Internal Medicine | Admitting: Internal Medicine

## 2019-07-27 DIAGNOSIS — G44309 Post-traumatic headache, unspecified, not intractable: Secondary | ICD-10-CM | POA: Insufficient documentation

## 2019-07-27 MED ORDER — GADOBUTROL 1 MMOL/ML IV SOLN
6.0000 mL | Freq: Once | INTRAVENOUS | Status: AC | PRN
  Administered 2019-07-27: 6 mL via INTRAVENOUS

## 2019-07-29 ENCOUNTER — Other Ambulatory Visit: Payer: Self-pay | Admitting: Internal Medicine

## 2019-07-29 DIAGNOSIS — R9089 Other abnormal findings on diagnostic imaging of central nervous system: Secondary | ICD-10-CM

## 2019-07-29 DIAGNOSIS — R519 Headache, unspecified: Secondary | ICD-10-CM

## 2019-07-29 NOTE — Progress Notes (Signed)
Order placed for ENT referral.   

## 2019-08-01 ENCOUNTER — Ambulatory Visit: Payer: BC Managed Care – PPO

## 2019-08-11 ENCOUNTER — Telehealth: Payer: Self-pay | Admitting: Internal Medicine

## 2019-08-11 NOTE — Telephone Encounter (Signed)
Please call pt and confirm if she is ok.  She was having persistent headaches.  MRI with sinus changes, etc.  Was referred to ENT.  Received notice - appt not scheduled.  Did she see someone else.

## 2019-08-11 NOTE — Telephone Encounter (Signed)
Patient is going to call [facility]. Says she did not receive anything. Number provided to pt.

## 2019-08-11 NOTE — Telephone Encounter (Signed)
lvm for pt to cb to sched appt" [facility] [place] said 9 days ago  "Rejection Reason - Patient did not respond - pt did not cb to sched appt"  [facility] [place] said about 23 hours ago

## 2019-09-07 ENCOUNTER — Ambulatory Visit
Admission: RE | Admit: 2019-09-07 | Discharge: 2019-09-07 | Disposition: A | Discharge status: Home / Self Care | Payer: BC Managed Care – PPO | Source: Ambulatory Visit | Attending: General Surgery | Admitting: General Surgery

## 2019-09-07 ENCOUNTER — Other Ambulatory Visit: Payer: Self-pay

## 2019-09-07 DIAGNOSIS — R928 Other abnormal and inconclusive findings on diagnostic imaging of breast: Secondary | ICD-10-CM | POA: Diagnosis present

## 2019-09-07 DIAGNOSIS — N6320 Unspecified lump in the left breast, unspecified quadrant: Secondary | ICD-10-CM

## 2019-09-09 ENCOUNTER — Other Ambulatory Visit: Payer: Self-pay

## 2019-09-09 ENCOUNTER — Ambulatory Visit (INDEPENDENT_AMBULATORY_CARE_PROVIDER_SITE_OTHER): Payer: BC Managed Care – PPO | Admitting: Internal Medicine

## 2019-09-09 VITALS — BP 114/70 | HR 83 | Temp 98.0°F | Resp 16 | Ht 67.0 in | Wt 138.0 lb

## 2019-09-09 DIAGNOSIS — Z7189 Other specified counseling: Secondary | ICD-10-CM

## 2019-09-09 DIAGNOSIS — H9190 Unspecified hearing loss, unspecified ear: Secondary | ICD-10-CM

## 2019-09-09 DIAGNOSIS — Z13220 Encounter for screening for lipoid disorders: Secondary | ICD-10-CM

## 2019-09-09 DIAGNOSIS — R0981 Nasal congestion: Secondary | ICD-10-CM

## 2019-09-09 DIAGNOSIS — F439 Reaction to severe stress, unspecified: Secondary | ICD-10-CM

## 2019-09-09 DIAGNOSIS — D649 Anemia, unspecified: Secondary | ICD-10-CM

## 2019-09-09 DIAGNOSIS — Z7185 Encounter for immunization safety counseling: Secondary | ICD-10-CM

## 2019-09-09 DIAGNOSIS — G44309 Post-traumatic headache, unspecified, not intractable: Secondary | ICD-10-CM

## 2019-09-09 DIAGNOSIS — D72819 Decreased white blood cell count, unspecified: Secondary | ICD-10-CM | POA: Diagnosis not present

## 2019-09-09 LAB — COMPREHENSIVE METABOLIC PANEL
ALT: 19 U/L (ref 0–35)
AST: 23 U/L (ref 0–37)
Albumin: 4.4 g/dL (ref 3.5–5.2)
Alkaline Phosphatase: 57 U/L (ref 39–117)
BUN: 9 mg/dL (ref 6–23)
CO2: 29 mEq/L (ref 19–32)
Calcium: 9.4 mg/dL (ref 8.4–10.5)
Chloride: 102 mEq/L (ref 96–112)
Creatinine, Ser: 0.74 mg/dL (ref 0.40–1.20)
GFR: 83.08 mL/min (ref >60.00)
Glucose, Bld: 89 mg/dL (ref 70–99)
Potassium: 5 mEq/L (ref 3.5–5.1)
Sodium: 138 mEq/L (ref 135–145)
Total Bilirubin: 0.4 mg/dL (ref 0.2–1.2)
Total Protein: 7 g/dL (ref 6.0–8.3)

## 2019-09-09 LAB — CBC WITH DIFFERENTIAL/PLATELET
Basophils Absolute: 0.1 10*3/uL (ref 0.0–0.1)
Basophils Relative: 2.9 % (ref 0.0–3.0)
Eosinophils Absolute: 0 10*3/uL (ref 0.0–0.7)
Eosinophils Relative: 0.9 % (ref 0.0–5.0)
HCT: 39.4 % (ref 36.0–46.0)
Hemoglobin: 13 g/dL (ref 12.0–15.0)
Lymphocytes Relative: 20.3 % (ref 12.0–46.0)
Lymphs Abs: 0.8 10*3/uL (ref 0.7–4.0)
MCHC: 33.1 g/dL (ref 30.0–36.0)
MCV: 91 fl (ref 78.0–100.0)
Monocytes Absolute: 0.5 10*3/uL (ref 0.1–1.0)
Monocytes Relative: 11.9 % (ref 3.0–12.0)
Neutro Abs: 2.5 10*3/uL (ref 1.4–7.7)
Neutrophils Relative %: 64 % (ref 43.0–77.0)
Platelets: 251 10*3/uL (ref 150.0–400.0)
RBC: 4.32 Mil/uL (ref 3.87–5.11)
RDW: 14.3 % (ref 11.5–15.5)
WBC: 4 10*3/uL (ref 4.0–10.5)

## 2019-09-09 LAB — LIPID PANEL
Cholesterol: 175 mg/dL (ref 0–200)
HDL: 99 mg/dL (ref >39.00)
LDL Cholesterol: 67 mg/dL (ref 0–99)
NonHDL: 76.3
Total CHOL/HDL Ratio: 2
Triglycerides: 46 mg/dL (ref 0.0–149.0)
VLDL: 9.2 mg/dL (ref 0.0–40.0)

## 2019-09-09 LAB — IBC + FERRITIN
Ferritin: 15.8 ng/mL (ref 10.0–291.0)
Iron: 32 ug/dL — ABNORMAL LOW (ref 42–145)
Saturation Ratios: 7.1 % — ABNORMAL LOW (ref 20.0–50.0)
Transferrin: 324 mg/dL (ref 212.0–360.0)

## 2019-09-09 LAB — FOLLICLE STIMULATING HORMONE: FSH: 10.6 m[IU]/mL

## 2019-09-09 LAB — TSH: TSH: 1.7 u[IU]/mL (ref 0.35–4.50)

## 2019-09-09 NOTE — Progress Notes (Signed)
Patient ID: Adrienne Phillips, female   DOB: Sep 07, 1970, 49 y.o.   MRN: [id number]   Subjective:    Patient ID: Adrienne Phillips, female    DOB: [birth year], 49 y.o.   MRN: [id number]  HPI This visit occurred during the SARS-CoV-2 public health emergency.  Safety protocols were in place, including screening questions prior to the visit, additional usage of staff PPE, and extensive cleaning of exam room while observing appropriate contact time as indicated for disinfecting solutions.  Patient here for a scheduled follow up. She reports she is doing relatively well.  Hearing change.  Saw ENT 09/06/19 with chronic sinusitis.  Noted to have moderate mucosal thickening on MRI of right maxillary sinus with deviated nasal septum, etc.  Trial of steroid nasal spray.  She also had questions about covid vaccine.  Works for [employer].  States scheduled to get today.  Discussed covid vaccine, etc.  Questions answered.   No headache now.  Stays active.  No chest pain or sob.  No acid reflux or abdominal pain reported.  Bowels stable.     Past Medical History:  Diagnosis Date  . Anemia   . Hypoglycemia   . Iron deficiency   . Migraine   . Ovarian cyst    Past Surgical History:  Procedure Laterality Date  . BREAST CYST ASPIRATION Left 1998   Family History  Problem Relation Age of Onset  . Hypertension Mother        migraines  . Cancer Father        colon and a hx of skin  . Breast cancer Sister 67  . Rheum arthritis Brother   . Osteoporosis Paternal Grandmother   . Ulcers Paternal Grandfather   . Breast cancer Maternal Aunt 70  . Breast cancer Paternal Aunt    Social History   Socioeconomic History  . Marital status: Married    Spouse name: Not on file  . Number of children: 0  . Years of education: Not on file  . Highest education level: Not on file  Occupational History    Employer: [employer]  Tobacco Use  . Smoking status: Never Smoker  . Smokeless tobacco: Never Used  Vaping Use  . Vaping  Use: Never used  Substance and Sexual Activity  . Alcohol use: Yes    Alcohol/week: 0.0 standard drinks  . Drug use: No  . Sexual activity: Not on file  Other Topics Concern  . Not on file  Social History Narrative  . Not on file   Social Determinants of Health   Financial Resource Strain:   . Difficulty of Paying Living Expenses: Not on file  Food Insecurity:   . Worried About Programme researcher, broadcasting/film/video in the Last Year: Not on file  . Ran Out of Food in the Last Year: Not on file  Transportation Needs:   . Lack of Transportation (Medical): Not on file  . Lack of Transportation (Non-Medical): Not on file  Physical Activity:   . Days of Exercise per Week: Not on file  . Minutes of Exercise per Session: Not on file  Stress:   . Feeling of Stress : Not on file  Social Connections:   . Frequency of Communication with Friends and Family: Not on file  . Frequency of Social Gatherings with Friends and Family: Not on file  . Attends Religious Services: Not on file  . Active Member of Clubs or Organizations: Not on file  . Attends Banker Meetings: Not  on file  . Marital Status: Not on file    Outpatient Encounter Medications as of 09/09/2019  Medication Sig  . ALPRAZolam (XANAX) 0.25 MG tablet Take 1 tablet (0.25 mg total) by mouth daily as needed for sleep.  . cyanocobalamin 2000 MCG tablet Take 2,000 mcg by mouth daily.  . Ferrous Sulfate (IRON PO) Take by mouth.  . Multiple Vitamin (MULTIVITAMIN) capsule Take 1 capsule by mouth daily.  . mupirocin ointment (BACTROBAN) 2 % Apply 1 application topically 2 (two) times daily. Left middle finger as needed  . Vitamin D, Ergocalciferol, (DRISDOL) 50000 units CAPS capsule Take 1 capsule (50,000 Units total) by mouth every 7 (seven) days.  . metaxalone (SKELAXIN) 800 MG tablet TAKE 1/2 TO 1 TABLET BY MOUTH AT BEDTIME AS NEEDED FOR BACK SPASMS (Patient not taking: Reported on 06/10/2019)   No facility-administered encounter  medications on file as of 09/09/2019.    Review of Systems  Constitutional: Negative for appetite change and unexpected weight change.  HENT: Negative for congestion and sinus pressure.        Hearing change as outlined.    Respiratory: Negative for cough, chest tightness and shortness of breath.   Cardiovascular: Negative for chest pain, palpitations and leg swelling.  Gastrointestinal: Negative for abdominal pain, diarrhea, nausea and vomiting.  Genitourinary: Negative for difficulty urinating and dysuria.  Musculoskeletal: Negative for joint swelling and myalgias.  Skin: Negative for color change and rash.  Neurological: Negative for dizziness and headaches.  Psychiatric/Behavioral: Negative for agitation and dysphoric mood.       Objective:    Physical Exam Vitals reviewed.  Constitutional:      General: She is not in acute distress.    Appearance: Normal appearance.  HENT:     Head: Normocephalic and atraumatic.     Right Ear: External ear normal.     Left Ear: External ear normal.  Eyes:     General: No scleral icterus.       Right eye: No discharge.        Left eye: No discharge.     Conjunctiva/sclera: Conjunctivae normal.  Neck:     Thyroid: No thyromegaly.  Cardiovascular:     Rate and Rhythm: Normal rate and regular rhythm.  Pulmonary:     Effort: No respiratory distress.     Breath sounds: Normal breath sounds. No wheezing.  Abdominal:     General: Bowel sounds are normal.     Palpations: Abdomen is soft.     Tenderness: There is no abdominal tenderness.  Musculoskeletal:        General: No swelling or tenderness.     Cervical back: Neck supple. No tenderness.  Lymphadenopathy:     Cervical: No cervical adenopathy.  Skin:    Findings: No erythema or rash.  Neurological:     Mental Status: She is alert.  Psychiatric:        Mood and Affect: Mood normal.        Behavior: Behavior normal.     BP 114/70   Pulse 83   Temp 98 F (36.7 C) (Oral)    Resp 16   Ht 5\' 7"  (1.702 m)   Wt 138 lb (62.6 kg)   LMP 08/27/2019 (Approximate)   SpO2 99%   BMI 21.61 kg/m  Wt Readings from Last 3 Encounters:  09/10/19 138 lb (62.6 kg)  09/09/19 138 lb (62.6 kg)  06/28/19 142 lb 1.6 oz (64.5 kg)     Lab Results  Component Value Date   WBC 4.0 09/09/2019   HGB 13.0 09/09/2019   HCT 39.4 09/09/2019   PLT 251.0 09/09/2019   GLUCOSE 89 09/09/2019   CHOL 175 09/09/2019   TRIG 46.0 09/09/2019   HDL 99.00 09/09/2019   LDLCALC 67 09/09/2019   ALT 19 09/09/2019   AST 23 09/09/2019   NA 138 09/09/2019   K 5.0 09/09/2019   CL 102 09/09/2019   CREATININE 0.74 09/09/2019   BUN 9 09/09/2019   CO2 29 09/09/2019   TSH 1.70 09/09/2019   HGBA1C 5.4 11/02/2015    US BREAST ASPIRATION LEFT  Result Date: 09/07/2019 CLINICAL DATA:  Probable complex cyst in the left breast. Aspiration recommended. EXAM: ULTRASOUND GUIDED LEFT BREAST CYST ASPIRATION COMPARISON:  Previous exams. PROCEDURE: Using sterile technique, 1% lidocaine, under direct ultrasound visualization, needle aspiration of a cyst in the 1 o'clock region of the left breast 2 cm from the nipple was performed. Approximately 1 cc of dark green fluid was aspirated consistent with a benign etiology. IMPRESSION: Ultrasound-guided aspiration of a cyst in the 1 o'clock region of the left breast no apparent complications. Bilateral screening mammogram in March of 2022 is recommended. Electronically Signed   By: Baird Lyons M.D.   On: 09/07/2019 08:35       Assessment & Plan:   Problem List Items Addressed This Visit    Vaccine counseling    Had questions about covid vaccine.  Discussed with her.  Questions answered.       Stress    Working from home.  Handling stress.  Follow.        Leukopenia - Primary    Follow cbc.       Hearing loss    Consider formal hearing evaluation through ENT.       Headache    No headache now.  Follow.        Congestion of nasal sinus    MRI as  outlined.  Saw ENT.  nasacort nasal spray as directed.  Follow.        Anemia    Follow cbc and iron studies.        Relevant Orders   CBC with Differential/Platelet (Completed)   Comprehensive metabolic panel (Completed)   TSH (Completed)   FSH (Completed)   IBC + Ferritin (Completed)    Other Visit Diagnoses    Screening cholesterol level       Relevant Orders   Lipid panel (Completed)       Dale Vinco, MD

## 2019-09-10 ENCOUNTER — Other Ambulatory Visit: Payer: Self-pay

## 2019-09-10 ENCOUNTER — Other Ambulatory Visit: Payer: Self-pay | Admitting: Internal Medicine

## 2019-09-10 ENCOUNTER — Ambulatory Visit
Admission: EM | Admit: 2019-09-10 | Discharge: 2019-09-10 | Disposition: A | Discharge status: Home / Self Care | Payer: BC Managed Care – PPO | Attending: Family Medicine | Admitting: Family Medicine

## 2019-09-10 ENCOUNTER — Ambulatory Visit (INDEPENDENT_AMBULATORY_CARE_PROVIDER_SITE_OTHER): Payer: BC Managed Care – PPO

## 2019-09-10 ENCOUNTER — Encounter: Payer: Self-pay | Admitting: Emergency Medicine

## 2019-09-10 DIAGNOSIS — E875 Hyperkalemia: Secondary | ICD-10-CM

## 2019-09-10 DIAGNOSIS — S62354A Nondisplaced fracture of shaft of fourth metacarpal bone, right hand, initial encounter for closed fracture: Secondary | ICD-10-CM | POA: Diagnosis not present

## 2019-09-10 DIAGNOSIS — M25531 Pain in right wrist: Secondary | ICD-10-CM

## 2019-09-10 DIAGNOSIS — M79644 Pain in right finger(s): Secondary | ICD-10-CM

## 2019-09-10 MED ORDER — MELOXICAM 15 MG PO TABS: 15.0000 mg | ORAL_TABLET | Freq: Every day | ORAL | 0 refills | Status: DC | PRN

## 2019-09-10 NOTE — Discharge Instructions (Signed)
Rest, ice, elevate.  Medication as needed.  Please call [facility] clinic Orthopedics [phone]) OR [facility] [phone]) for an appt.

## 2019-09-10 NOTE — Progress Notes (Signed)
Order placed for f/u potassium check.  

## 2019-09-10 NOTE — ED Triage Notes (Signed)
Patient states that  She fell in her garden about 1 hour ago.  Patient c/o pain in her right wrist and right 3rd and 4th fingers.

## 2019-09-11 NOTE — ED Provider Notes (Signed)
[facility]    CSN: [id number] Arrival date & time: 09/10/19  1523      History   Chief Complaint Chief Complaint  Patient presents with  . Fall  . Wrist Pain   HPI   49 year old female presents with the above complaint.  Patient states that she was walking in her garden and went to step over a fence and subsequently fell forward on an outstretched right hand.  Patient experiencing pain in the wrist and hand.  Pain 5/10 in severity.  No relieving factors.  She is currently icing the area.  No other associated symptoms.  No other complaints.  Past Medical History:  Diagnosis Date  . Anemia   . Hypoglycemia   . Iron deficiency   . Migraine   . Ovarian cyst     Patient Active Problem List   Diagnosis Date Noted  . Headache 07/07/2019  . Breast tenderness 07/03/2019  . Menstrual changes 09/13/2018  . Facial droop 04/10/2017  . Numbness and tingling of right arm and leg 04/10/2017  . Vaginal lesion 12/21/2016  . Sinusitis 10/10/2016  . Stress 07/18/2016  . Abnormal Pap smear of cervix 01/06/2016  . Chest pain 06/30/2015  . Left arm numbness 06/27/2015  . Neck pain 06/27/2015  . Bad odor of urine 11/13/2014  . Back pain 11/08/2014  . History of colonic polyps 03/19/2014  . Health care maintenance 03/19/2014  . Congestion of nasal sinus 03/19/2014  . Barrett's esophagus 03/20/2013  . Anemia 02/15/2012  . Leukopenia 02/15/2012    Past Surgical History:  Procedure Laterality Date  . BREAST CYST ASPIRATION Left 1998    OB History   No obstetric history on file.      Home Medications    Prior to Admission medications   Medication Sig Start Date End Date Taking? Authorizing Provider  ALPRAZolam (XANAX) 0.25 MG tablet Take 1 tablet (0.25 mg total) by mouth daily as needed for sleep. 07/05/19  Yes Dale Glenaire, MD  Ferrous Sulfate (IRON PO) Take by mouth.   Yes [provider]  Multiple Vitamin (MULTIVITAMIN) capsule Take 1 capsule by  mouth daily.   Yes [provider]  Vitamin D, Ergocalciferol, (DRISDOL) 50000 units CAPS capsule Take 1 capsule (50,000 Units total) by mouth every 7 (seven) days. 03/07/16  Yes Dale [place], MD  cyanocobalamin 2000 MCG tablet Take 2,000 mcg by mouth daily.    [provider]  meloxicam (MOBIC) 15 MG tablet Take 1 tablet (15 mg total) by mouth daily as needed for pain. 09/10/19   Tommie Sams, DO  metaxalone (SKELAXIN) 800 MG tablet TAKE 1/2 TO 1 TABLET BY MOUTH AT BEDTIME AS NEEDED FOR BACK SPASMS Patient not taking: Reported on 06/10/2019 07/15/16   Dale , MD  mupirocin ointment (BACTROBAN) 2 % Apply 1 application topically 2 (two) times daily. Left middle finger as needed 06/10/19   McLean-Scocuzza, Pasty Spillers, MD    Family History Family History  Problem Relation Age of Onset  . Hypertension Mother        migraines  . Cancer Father        colon and a hx of skin  . Breast cancer Sister 66  . Rheum arthritis Brother   . Osteoporosis Paternal Grandmother   . Ulcers Paternal Grandfather   . Breast cancer Maternal Aunt 70  . Breast cancer Paternal Aunt     Social History Social History   Tobacco Use  . Smoking status: Never Smoker  .  Smokeless tobacco: Never Used  Vaping Use  . Vaping Use: Never used  Substance Use Topics  . Alcohol use: Yes    Alcohol/week: 0.0 standard drinks  . Drug use: No     Allergies   Codeine and Topamax [topiramate]   Review of Systems Review of Systems  Constitutional: Negative.   Musculoskeletal:       Injury - right hand & wrist   Physical Exam Triage Vital Signs ED Triage Vitals  Enc Vitals Group     BP 09/10/19 1549 108/78     Pulse Rate 09/10/19 1549 79     Resp 09/10/19 1549 14     Temp 09/10/19 1549 98 F (36.7 C)     Temp Source 09/10/19 1549 Oral     SpO2 09/10/19 1549 100 %     Weight 09/10/19 1549 138 lb (62.6 kg)     Height 09/10/19 1549 5\' 7"  (1.702 m)     Head Circumference --      Peak  Flow --      Pain Score 09/10/19 1548 5     Pain Loc --      Pain Edu? --      Excl. in GC? --    Updated Vital Signs BP 108/78 (BP Location: Left Arm)   Pulse 79   Temp 98 F (36.7 C) (Oral)   Resp 14   Ht 5\' 7"  (1.702 m)   Wt 62.6 kg   LMP 08/27/2019 (Approximate)   SpO2 100%   BMI 21.61 kg/m   Visual Acuity Right Eye Distance:   Left Eye Distance:   Bilateral Distance:    Right Eye Near:   Left Eye Near:    Bilateral Near:     Physical Exam Constitutional:      General: She is not in acute distress.    Appearance: Normal appearance. She is not ill-appearing.  HENT:     Head: Normocephalic and atraumatic.  Eyes:     General:        Right eye: No discharge.        Left eye: No discharge.     Conjunctiva/sclera: Conjunctivae normal.  Cardiovascular:     Rate and Rhythm: Normal rate and regular rhythm.  Pulmonary:     Effort: Pulmonary effort is normal.     Breath sounds: Normal breath sounds.  Musculoskeletal:     Comments: Right hand and wrist -no discrete areas of tenderness at the wrist.  No swelling.  Patient has tenderness over the fourth and fifth metacarpals.  Neurological:     Mental Status: She is alert.  Psychiatric:        Mood and Affect: Mood normal.        Behavior: Behavior normal.    UC Treatments / Results  Labs (all labs ordered are listed, but only abnormal results are displayed) Labs Reviewed - No data to display  EKG   Radiology DG Wrist Complete Right  Result Date: 09/10/2019 CLINICAL DATA:  Pain after fall EXAM: RIGHT WRIST - COMPLETE 3+ VIEW COMPARISON:  None. FINDINGS: Nondisplaced fracture through the fourth metacarpal. IMPRESSION: Nondisplaced fracture through the fourth metacarpal. No wrist fracture identified. Electronically Signed   By: 10/27/2019 III M.D   On: 09/10/2019 16:09   DG Hand Complete Right  Result Date: 09/10/2019 CLINICAL DATA:  Right wrist pain and right third and fourth finger pain due to fall.  EXAM: RIGHT HAND - COMPLETE 3+ VIEW COMPARISON:  None. FINDINGS:  There is a nondisplaced fracture through the fourth metacarpal. No other acute fractures are identified. IMPRESSION: Nondisplaced fracture through the fourth metacarpal. Electronically Signed   By: Gerome Sam III M.D   On: 09/10/2019 16:07    Procedures Procedures (including critical care time)  Medications Ordered in UC Medications - No data to display  Initial Impression / Assessment and Plan / UC Course  I have reviewed the triage vital signs and the nursing notes.  Pertinent labs & imaging results that were available during my care of the patient were reviewed by me and considered in my medical decision making (see chart for details).    49 year old female presents with an injury to her right wrist and hand.  X-rays obtained today and independently reviewed by me.  Interpretation: No fracture of the wrist.  Hand x-ray revealed a nondisplaced fracture of the fourth metacarpal.  Patient placed in ulnar gutter.  Advised to follow-up with orthopedics.  Meloxicam as directed.    Final Clinical Impressions(s) / UC Diagnoses   Final diagnoses:  Nondisplaced fracture of shaft of fourth metacarpal bone, right hand, initial encounter for closed fracture     Discharge Instructions     Rest, ice, elevate.  Medication as needed.  Please call [facility] clinic Orthopedics [phone]) OR [facility] [phone]) for an appt.   ED Prescriptions    Medication Sig Dispense Auth. Provider   meloxicam (MOBIC) 15 MG tablet Take 1 tablet (15 mg total) by mouth daily as needed for pain. 30 tablet Tommie Sams, DO     PDMP not reviewed this encounter.   Tommie Sams, [place] 09/11/19 [phone]

## 2019-09-18 ENCOUNTER — Encounter: Payer: Self-pay | Admitting: Internal Medicine

## 2019-09-18 DIAGNOSIS — Z7185 Encounter for immunization safety counseling: Secondary | ICD-10-CM | POA: Insufficient documentation

## 2019-09-18 DIAGNOSIS — H9190 Unspecified hearing loss, unspecified ear: Secondary | ICD-10-CM | POA: Insufficient documentation

## 2019-09-18 NOTE — Assessment & Plan Note (Signed)
MRI as outlined.  Saw ENT.  nasacort nasal spray as directed.  Follow.

## 2019-09-18 NOTE — Assessment & Plan Note (Signed)
Follow cbc.  

## 2019-09-18 NOTE — Assessment & Plan Note (Signed)
No headache now.  Follow.   

## 2019-09-18 NOTE — Assessment & Plan Note (Signed)
Follow cbc and iron studies.  

## 2019-09-18 NOTE — Assessment & Plan Note (Signed)
Consider formal hearing evaluation through ENT.

## 2019-09-18 NOTE — Assessment & Plan Note (Signed)
Had questions about covid vaccine.  Discussed with her.  Questions answered.

## 2019-09-18 NOTE — Assessment & Plan Note (Signed)
Working from home.  Handling stress.  Follow.

## 2019-09-22 ENCOUNTER — Encounter: Payer: Self-pay | Admitting: Internal Medicine

## 2019-09-23 NOTE — Telephone Encounter (Signed)
In previously checking - I don't think she needs a new referral.  She can call and schedule if that is easier.  If any problems or if needs new referral, let us know.

## 2019-09-23 NOTE — Telephone Encounter (Signed)
See referral message.  In reviewing, it states that referral has been authorized.  States have tried to contact pt.  Can give her information.

## 2019-09-27 ENCOUNTER — Other Ambulatory Visit: Payer: BC Managed Care – PPO

## 2019-09-28 NOTE — Telephone Encounter (Signed)
Left detailed message for patient.

## 2019-10-04 ENCOUNTER — Other Ambulatory Visit (INDEPENDENT_AMBULATORY_CARE_PROVIDER_SITE_OTHER): Payer: BC Managed Care – PPO

## 2019-10-04 ENCOUNTER — Other Ambulatory Visit: Payer: Self-pay

## 2019-10-04 DIAGNOSIS — E875 Hyperkalemia: Secondary | ICD-10-CM | POA: Diagnosis not present

## 2019-10-05 LAB — POTASSIUM: Potassium: 4.2 mmol/L (ref 3.5–5.3)

## 2020-02-03 ENCOUNTER — Encounter: Payer: Self-pay | Admitting: Internal Medicine

## 2020-02-06 NOTE — Telephone Encounter (Signed)
Please call pt and confirm if any other symptoms - nausea, vomiting.  Bowels moving?  If persistent pain, may need appt to evaluate.

## 2020-02-07 NOTE — Telephone Encounter (Signed)
Confirmed no issues below. These have been ongoing issues that she wants to discuss so I have scheduled her for appt next week.

## 2020-02-07 NOTE — Telephone Encounter (Signed)
LMTCB

## 2020-02-15 ENCOUNTER — Ambulatory Visit (HOSPITAL_COMMUNITY)
Admission: RE | Admit: 2020-02-15 | Discharge: 2020-02-15 | Disposition: A | Discharge status: Home / Self Care | Payer: BC Managed Care – PPO | Attending: Internal Medicine | Admitting: Internal Medicine

## 2020-02-15 ENCOUNTER — Encounter: Payer: Self-pay | Admitting: Internal Medicine

## 2020-02-15 ENCOUNTER — Other Ambulatory Visit: Payer: Self-pay

## 2020-02-15 ENCOUNTER — Ambulatory Visit (INDEPENDENT_AMBULATORY_CARE_PROVIDER_SITE_OTHER): Payer: BC Managed Care – PPO | Admitting: Internal Medicine

## 2020-02-15 ENCOUNTER — Other Ambulatory Visit (HOSPITAL_COMMUNITY)
Admission: RE | Admit: 2020-02-15 | Discharge: 2020-02-15 | Disposition: A | Discharge status: Home / Self Care | Payer: BC Managed Care – PPO | Source: Ambulatory Visit | Attending: Internal Medicine | Admitting: Internal Medicine

## 2020-02-15 VITALS — BP 114/70 | HR 70 | Temp 97.8°F | Resp 16 | Ht 67.0 in | Wt 145.0 lb

## 2020-02-15 DIAGNOSIS — R319 Hematuria, unspecified: Secondary | ICD-10-CM | POA: Diagnosis not present

## 2020-02-15 DIAGNOSIS — N939 Abnormal uterine and vaginal bleeding, unspecified: Secondary | ICD-10-CM | POA: Insufficient documentation

## 2020-02-15 DIAGNOSIS — E875 Hyperkalemia: Secondary | ICD-10-CM

## 2020-02-15 DIAGNOSIS — R635 Abnormal weight gain: Secondary | ICD-10-CM | POA: Diagnosis not present

## 2020-02-15 DIAGNOSIS — R109 Unspecified abdominal pain: Secondary | ICD-10-CM | POA: Diagnosis not present

## 2020-02-15 DIAGNOSIS — M546 Pain in thoracic spine: Secondary | ICD-10-CM

## 2020-02-15 DIAGNOSIS — N898 Other specified noninflammatory disorders of vagina: Secondary | ICD-10-CM

## 2020-02-15 DIAGNOSIS — N926 Irregular menstruation, unspecified: Secondary | ICD-10-CM

## 2020-02-15 DIAGNOSIS — Z114 Encounter for screening for human immunodeficiency virus [HIV]: Secondary | ICD-10-CM

## 2020-02-15 DIAGNOSIS — F439 Reaction to severe stress, unspecified: Secondary | ICD-10-CM

## 2020-02-15 DIAGNOSIS — Z1159 Encounter for screening for other viral diseases: Secondary | ICD-10-CM

## 2020-02-15 LAB — HEPATIC FUNCTION PANEL
ALT: 17 U/L (ref 0–35)
AST: 19 U/L (ref 0–37)
Albumin: 4.4 g/dL (ref 3.5–5.2)
Alkaline Phosphatase: 56 U/L (ref 39–117)
Bilirubin, Direct: 0.1 mg/dL (ref 0.0–0.3)
Total Bilirubin: 0.5 mg/dL (ref 0.2–1.2)
Total Protein: 7.1 g/dL (ref 6.0–8.3)

## 2020-02-15 LAB — URINALYSIS, ROUTINE W REFLEX MICROSCOPIC
Bilirubin Urine: NEGATIVE
Hgb urine dipstick: NEGATIVE
Ketones, ur: NEGATIVE
Leukocytes,Ua: NEGATIVE
Nitrite: NEGATIVE
RBC / HPF: NONE SEEN (ref 0–?)
Specific Gravity, Urine: 1.01 (ref 1.000–1.030)
Total Protein, Urine: NEGATIVE
Urine Glucose: NEGATIVE
Urobilinogen, UA: 0.2 (ref 0.0–1.0)
pH: 7 (ref 5.0–8.0)

## 2020-02-15 LAB — CBC WITH DIFFERENTIAL/PLATELET
Basophils Absolute: 0.1 10*3/uL (ref 0.0–0.1)
Basophils Relative: 1.9 % (ref 0.0–3.0)
Eosinophils Absolute: 0 10*3/uL (ref 0.0–0.7)
Eosinophils Relative: 1.5 % (ref 0.0–5.0)
HCT: 39.3 % (ref 36.0–46.0)
Hemoglobin: 13.2 g/dL (ref 12.0–15.0)
Lymphocytes Relative: 22.5 % (ref 12.0–46.0)
Lymphs Abs: 0.7 10*3/uL (ref 0.7–4.0)
MCHC: 33.7 g/dL (ref 30.0–36.0)
MCV: 91.2 fl (ref 78.0–100.0)
Monocytes Absolute: 0.4 10*3/uL (ref 0.1–1.0)
Monocytes Relative: 14.2 % — ABNORMAL HIGH (ref 3.0–12.0)
Neutro Abs: 1.9 10*3/uL (ref 1.4–7.7)
Neutrophils Relative %: 59.9 % (ref 43.0–77.0)
Platelets: 255 10*3/uL (ref 150.0–400.0)
RBC: 4.31 Mil/uL (ref 3.87–5.11)
RDW: 14.6 % (ref 11.5–15.5)
WBC: 3.1 10*3/uL — ABNORMAL LOW (ref 4.0–10.5)

## 2020-02-15 LAB — BASIC METABOLIC PANEL
BUN: 13 mg/dL (ref 6–23)
CO2: 28 mEq/L (ref 19–32)
Calcium: 9.4 mg/dL (ref 8.4–10.5)
Chloride: 103 mEq/L (ref 96–112)
Creatinine, Ser: 0.7 mg/dL (ref 0.40–1.20)
GFR: 100.84 mL/min (ref >60.00)
Glucose, Bld: 86 mg/dL (ref 70–99)
Potassium: 5.6 mEq/L — ABNORMAL HIGH (ref 3.5–5.1)
Sodium: 138 mEq/L (ref 135–145)

## 2020-02-15 LAB — TSH: TSH: 1.91 u[IU]/mL (ref 0.35–4.50)

## 2020-02-15 LAB — FOLLICLE STIMULATING HORMONE: FSH: 20.9 m[IU]/mL

## 2020-02-15 LAB — POTASSIUM: Potassium: 4.5 mmol/L (ref 3.5–5.1)

## 2020-02-15 LAB — AMYLASE: Amylase: 34 U/L (ref 27–131)

## 2020-02-15 LAB — LIPASE: Lipase: 8 U/L — ABNORMAL LOW (ref 11.0–59.0)

## 2020-02-15 NOTE — Progress Notes (Signed)
Patient ID: Adrienne Phillips, female   DOB: Nov 26, 1970, 50 y.o.   MRN: [id number]   Subjective:    Patient ID: Adrienne Phillips, female    DOB: 28-Dec-1970, 50 y.o.   MRN: [id number]  HPI This visit occurred during the SARS-CoV-2 public health emergency.  Safety protocols were in place, including screening questions prior to the visit, additional usage of staff PPE, and extensive cleaning of exam room while observing appropriate contact time as indicated for disinfecting solutions.  Patient here as a work in appt with concerns regarding weight gain and irregular periods. Also reports increased left side pain.  States starting mid December, noticed increased pain left upper side/abdomen and will extend to lower abdomen.  Also noticed extended around to back.  Pain is constant.  Question if minimally worse after eating.  Did have massage recently.  Back feels better.  Has noticed question of hematuria.  Hs noticed more vaginal discharge.  No period since 11/29/19.  Also has noticed increased weight - gained 10 pounds.  Weight is back down now.  Eating. No nausea or vomiting.  Bowels moving.     Past Medical History:  Diagnosis Date  . Anemia   . Hypoglycemia   . Iron deficiency   . Migraine   . Ovarian cyst    Past Surgical History:  Procedure Laterality Date  . BREAST CYST ASPIRATION Left 1998   Family History  Problem Relation Age of Onset  . Hypertension Mother        migraines  . Cancer Father        colon and a hx of skin  . Breast cancer Sister 61  . Rheum arthritis Brother   . Osteoporosis Paternal Grandmother   . Ulcers Paternal Grandfather   . Breast cancer Maternal Aunt 70  . Breast cancer Paternal Aunt    Social History   Socioeconomic History  . Marital status: Married    Spouse name: Not on file  . Number of children: 0  . Years of education: Not on file  . Highest education level: Not on file  Occupational History    Employer: [employer]  Tobacco Use  . Smoking  status: Never Smoker  . Smokeless tobacco: Never Used  Vaping Use  . Vaping Use: Never used  Substance and Sexual Activity  . Alcohol use: Yes    Alcohol/week: 0.0 standard drinks  . Drug use: No  . Sexual activity: Not on file  Other Topics Concern  . Not on file  Social History Narrative  . Not on file   Social Determinants of Health   Financial Resource Strain: Not on file  Food Insecurity: Not on file  Transportation Needs: Not on file  Physical Activity: Not on file  Stress: Not on file  Social Connections: Not on file    Outpatient Encounter Medications as of 02/15/2020  Medication Sig  . ALPRAZolam (XANAX) 0.25 MG tablet Take 1 tablet (0.25 mg total) by mouth daily as needed for sleep.  . cyanocobalamin 2000 MCG tablet Take 2,000 mcg by mouth daily.  . Ferrous Sulfate (IRON PO) Take by mouth.  . meloxicam (MOBIC) 15 MG tablet Take 1 tablet (15 mg total) by mouth daily as needed for pain.  . metaxalone (SKELAXIN) 800 MG tablet TAKE 1/2 TO 1 TABLET BY MOUTH AT BEDTIME AS NEEDED FOR BACK SPASMS (Patient not taking: Reported on 06/10/2019)  . Multiple Vitamin (MULTIVITAMIN) capsule Take 1 capsule by mouth daily.  . mupirocin ointment (BACTROBAN)  2 % Apply 1 application topically 2 (two) times daily. Left middle finger as needed  . Vitamin D, Ergocalciferol, (DRISDOL) 50000 units CAPS capsule Take 1 capsule (50,000 Units total) by mouth every 7 (seven) days.   No facility-administered encounter medications on file as of 02/15/2020.    Review of Systems  Constitutional: Negative for appetite change and unexpected weight change.  HENT: Negative for congestion and sinus pressure.   Respiratory: Negative for cough, chest tightness and shortness of breath.   Cardiovascular: Negative for chest pain, palpitations and leg swelling.  Gastrointestinal: Negative for diarrhea, nausea and vomiting.       Left side abdominal pain.   Genitourinary: Negative for difficulty urinating and  dysuria.  Musculoskeletal: Positive for back pain. Negative for joint swelling and myalgias.  Skin: Negative for color change and rash.  Neurological: Negative for dizziness, light-headedness and headaches.  Psychiatric/Behavioral: Negative for agitation and dysphoric mood.       Objective:    Physical Exam Vitals reviewed.  Constitutional:      General: She is not in acute distress.    Appearance: Normal appearance.  HENT:     Head: Normocephalic and atraumatic.     Right Ear: External ear normal.     Left Ear: External ear normal.     Mouth/Throat:     Mouth: Oropharynx is clear and moist.  Eyes:     General: No scleral icterus.       Right eye: No discharge.        Left eye: No discharge.     Conjunctiva/sclera: Conjunctivae normal.  Neck:     Thyroid: No thyromegaly.  Cardiovascular:     Rate and Rhythm: Normal rate and regular rhythm.  Pulmonary:     Effort: No respiratory distress.     Breath sounds: Normal breath sounds. No wheezing.  Abdominal:     General: Bowel sounds are normal.     Palpations: Abdomen is soft.     Tenderness: There is no abdominal tenderness.     Comments: No worsening pain to palpation - abdomen.   Genitourinary:    Comments: Normal external genitalia.  Vaginal vault without lesions.  Discharge present.  KOH/wet prep obtained.  Could not appreciate any adnexal masses or tenderness.   Musculoskeletal:        General: No swelling, tenderness or edema.     Cervical back: Neck supple. No tenderness.  Lymphadenopathy:     Cervical: No cervical adenopathy.  Skin:    Findings: No erythema or rash.  Neurological:     Mental Status: She is alert.  Psychiatric:        Mood and Affect: Mood normal.        Behavior: Behavior normal.     BP 114/70   Pulse 70   Temp 97.8 F (36.6 C) (Oral)   Resp 16   Ht 5\' 7"  (1.702 m)   Wt 145 lb (65.8 kg)   SpO2 98%   BMI 22.71 kg/m  Wt Readings from Last 3 Encounters:  02/15/20 145 lb (65.8 kg)   09/10/19 138 lb (62.6 kg)  09/09/19 138 lb (62.6 kg)     Lab Results  Component Value Date   WBC 3.1 (L) 02/15/2020   HGB 13.2 02/15/2020   HCT 39.3 02/15/2020   PLT 255.0 02/15/2020   GLUCOSE 86 02/15/2020   CHOL 175 09/09/2019   TRIG 46.0 09/09/2019   HDL 99.00 09/09/2019   LDLCALC 67 09/09/2019  ALT 17 02/15/2020   AST 19 02/15/2020   NA 138 02/15/2020   K 4.5 02/15/2020   CL 103 02/15/2020   CREATININE 0.70 02/15/2020   BUN 13 02/15/2020   CO2 28 02/15/2020   TSH 1.91 02/15/2020   HGBA1C 5.4 11/02/2015    DG Wrist Complete Right  Result Date: 09/10/2019 CLINICAL DATA:  Pain after fall EXAM: RIGHT WRIST - COMPLETE 3+ VIEW COMPARISON:  None. FINDINGS: Nondisplaced fracture through the fourth metacarpal. IMPRESSION: Nondisplaced fracture through the fourth metacarpal. No wrist fracture identified. Electronically Signed   By: Gerome Sam III M.D   On: 09/10/2019 16:09   DG Hand Complete Right  Result Date: 09/10/2019 CLINICAL DATA:  Right wrist pain and right third and fourth finger pain due to fall. EXAM: RIGHT HAND - COMPLETE 3+ VIEW COMPARISON:  None. FINDINGS: There is a nondisplaced fracture through the fourth metacarpal. No other acute fractures are identified. IMPRESSION: Nondisplaced fracture through the fourth metacarpal. Electronically Signed   By: Gerome Sam III M.D   On: 09/10/2019 16:07       Assessment & Plan:   Problem List Items Addressed This Visit    Back pain - Primary    Left side back pain - improved with massage.  Follow. Check labs as outlined.       Hematuria    Question of hematuria.  Check urinalysis.        Relevant Orders   Urinalysis, Routine w reflex microscopic (Completed)   Irregular periods    No period for two months.  Check serum pregnancy test. Also check labs, including tsh.  Follow.       Relevant Orders   TSH (Completed)   FSH (Completed)   Beta hCG quant (ref lab) (Completed)   Left sided abdominal pain     Persistent pain.  Not reproducible on exam. Discussed further w/up and evaluation.  Check urine.  Also check cbc, pancreas tests and liver panel.  Further w/up pending results.        Relevant Orders   Hepatic function panel (Completed)   Basic metabolic panel (Completed)   Amylase (Completed)   Lipase (Completed)   Stress    Overall appears to be handling things well.  Follow.       Vaginal discharge    Vaginal discharge on exam.  KOH/wet prep sent.       Weight gain    Weight has increased.  Will check routine labs including tsh.  Follow.       Relevant Orders   CBC with Differential/Platelet (Completed)    Other Visit Diagnoses    Need for hepatitis C screening test       Relevant Orders   Hepatitis C antibody (Completed)   Encounter for screening for HIV       Relevant Orders   HIV antibody (with reflex) (Completed)   Vaginal bleeding       Relevant Orders   Cervicovaginal ancillary only( [place]) (Completed)       D[place], MD

## 2020-02-16 ENCOUNTER — Encounter: Payer: Self-pay | Admitting: Internal Medicine

## 2020-02-16 LAB — CERVICOVAGINAL ANCILLARY ONLY
Bacterial Vaginitis (gardnerella): POSITIVE — AB
Candida Glabrata: NEGATIVE
Candida Vaginitis: NEGATIVE
Comment: NEGATIVE
Comment: NEGATIVE
Comment: NEGATIVE

## 2020-02-16 LAB — HEPATITIS C ANTIBODY
Hepatitis C Ab: NONREACTIVE
SIGNAL TO CUT-OFF: 0.01 (ref <1.00)

## 2020-02-16 LAB — BETA HCG QUANT (REF LAB): hCG Quant: <1 m[IU]/mL

## 2020-02-16 LAB — HIV ANTIBODY (ROUTINE TESTING W REFLEX): HIV 1&2 Ab, 4th Generation: NONREACTIVE

## 2020-02-20 ENCOUNTER — Encounter: Payer: Self-pay | Admitting: Internal Medicine

## 2020-02-20 DIAGNOSIS — N898 Other specified noninflammatory disorders of vagina: Secondary | ICD-10-CM | POA: Insufficient documentation

## 2020-02-20 MED ORDER — METRONIDAZOLE 0.75 % VA GEL: 1.0000 | Freq: Two times a day (BID) | VAGINAL | 0 refills | Status: DC

## 2020-02-20 NOTE — Assessment & Plan Note (Signed)
Persistent pain.  Not reproducible on exam. Discussed further w/up and evaluation.  Check urine.  Also check cbc, pancreas tests and liver panel.  Further w/up pending results.

## 2020-02-20 NOTE — Assessment & Plan Note (Signed)
Left side back pain - improved with massage.  Follow. Check labs as outlined.

## 2020-02-20 NOTE — Assessment & Plan Note (Signed)
No period for two months.  Check serum pregnancy test. Also check labs, including tsh.  Follow.

## 2020-02-20 NOTE — Telephone Encounter (Signed)
rx sent in for metrogel vaginal.   

## 2020-02-20 NOTE — Assessment & Plan Note (Signed)
Vaginal discharge on exam.  KOH/wet prep sent.

## 2020-02-20 NOTE — Assessment & Plan Note (Signed)
Weight has increased.  Will check routine labs including tsh.  Follow.

## 2020-02-20 NOTE — Assessment & Plan Note (Signed)
Overall appears to be handling things well.  Follow.  ?

## 2020-02-20 NOTE — Assessment & Plan Note (Signed)
Question of hematuria.  Check urinalysis.

## 2020-02-22 MED ORDER — METAXALONE 800 MG PO TABS
ORAL_TABLET | ORAL | 0 refills | Status: DC
Start: 2020-02-22 — End: 2021-05-07

## 2020-02-22 NOTE — Telephone Encounter (Signed)
rx sent in for skelaxin.

## 2020-03-16 ENCOUNTER — Encounter: Payer: BC Managed Care – PPO | Admitting: Internal Medicine

## 2020-04-19 ENCOUNTER — Other Ambulatory Visit: Payer: Self-pay

## 2020-04-19 ENCOUNTER — Encounter: Payer: Self-pay | Admitting: Internal Medicine

## 2020-04-19 ENCOUNTER — Ambulatory Visit (INDEPENDENT_AMBULATORY_CARE_PROVIDER_SITE_OTHER): Payer: BC Managed Care – PPO | Admitting: Internal Medicine

## 2020-04-19 VITALS — BP 106/68 | HR 64 | Temp 97.6°F | Resp 16 | Ht 67.0 in | Wt 140.0 lb

## 2020-04-19 DIAGNOSIS — Z1231 Encounter for screening mammogram for malignant neoplasm of breast: Secondary | ICD-10-CM

## 2020-04-19 DIAGNOSIS — Z0000 Encounter for general adult medical examination without abnormal findings: Secondary | ICD-10-CM

## 2020-04-19 DIAGNOSIS — D649 Anemia, unspecified: Secondary | ICD-10-CM | POA: Diagnosis not present

## 2020-04-19 DIAGNOSIS — Z86010 Personal history of colonic polyps: Secondary | ICD-10-CM

## 2020-04-19 DIAGNOSIS — N644 Mastodynia: Secondary | ICD-10-CM

## 2020-04-19 DIAGNOSIS — Z13220 Encounter for screening for lipoid disorders: Secondary | ICD-10-CM | POA: Diagnosis not present

## 2020-04-19 DIAGNOSIS — D72819 Decreased white blood cell count, unspecified: Secondary | ICD-10-CM

## 2020-04-19 LAB — COMPREHENSIVE METABOLIC PANEL
ALT: 16 U/L (ref 0–35)
AST: 22 U/L (ref 0–37)
Albumin: 4.4 g/dL (ref 3.5–5.2)
Alkaline Phosphatase: 52 U/L (ref 39–117)
BUN: 15 mg/dL (ref 6–23)
CO2: 28 mEq/L (ref 19–32)
Calcium: 9.1 mg/dL (ref 8.4–10.5)
Chloride: 101 mEq/L (ref 96–112)
Creatinine, Ser: 0.66 mg/dL (ref 0.40–1.20)
GFR: 102.16 mL/min (ref >60.00)
Glucose, Bld: 93 mg/dL (ref 70–99)
Potassium: 4.5 mEq/L (ref 3.5–5.1)
Sodium: 136 mEq/L (ref 135–145)
Total Bilirubin: 0.4 mg/dL (ref 0.2–1.2)
Total Protein: 6.9 g/dL (ref 6.0–8.3)

## 2020-04-19 LAB — CBC WITH DIFFERENTIAL/PLATELET
Basophils Absolute: 0.1 10*3/uL (ref 0.0–0.1)
Basophils Relative: 2.6 % (ref 0.0–3.0)
Eosinophils Absolute: 0.1 10*3/uL (ref 0.0–0.7)
Eosinophils Relative: 2 % (ref 0.0–5.0)
HCT: 37.7 % (ref 36.0–46.0)
Hemoglobin: 12.6 g/dL (ref 12.0–15.0)
Lymphocytes Relative: 24.2 % (ref 12.0–46.0)
Lymphs Abs: 0.8 10*3/uL (ref 0.7–4.0)
MCHC: 33.5 g/dL (ref 30.0–36.0)
MCV: 87.3 fl (ref 78.0–100.0)
Monocytes Absolute: 0.4 10*3/uL (ref 0.1–1.0)
Monocytes Relative: 11.8 % (ref 3.0–12.0)
Neutro Abs: 1.9 10*3/uL (ref 1.4–7.7)
Neutrophils Relative %: 59.4 % (ref 43.0–77.0)
Platelets: 245 10*3/uL (ref 150.0–400.0)
RBC: 4.31 Mil/uL (ref 3.87–5.11)
RDW: 14 % (ref 11.5–15.5)
WBC: 3.2 10*3/uL — ABNORMAL LOW (ref 4.0–10.5)

## 2020-04-19 LAB — IBC + FERRITIN
Ferritin: 9.9 ng/mL — ABNORMAL LOW (ref 10.0–291.0)
Iron: 53 ug/dL (ref 42–145)
Saturation Ratios: 11 % — ABNORMAL LOW (ref 20.0–50.0)
Transferrin: 343 mg/dL (ref 212.0–360.0)

## 2020-04-19 LAB — LIPID PANEL
Cholesterol: 217 mg/dL — ABNORMAL HIGH (ref 0–200)
HDL: 98.1 mg/dL (ref >39.00)
LDL Cholesterol: 112 mg/dL — ABNORMAL HIGH (ref 0–99)
NonHDL: 118.87
Total CHOL/HDL Ratio: 2
Triglycerides: 36 mg/dL (ref 0.0–149.0)
VLDL: 7.2 mg/dL (ref 0.0–40.0)

## 2020-04-19 LAB — VITAMIN B12: Vitamin B-12: 648 pg/mL (ref 211–911)

## 2020-04-19 NOTE — Assessment & Plan Note (Signed)
Periods as outlined.  Check cbc and iron studies today.  She is planning to start liquid iron.

## 2020-04-19 NOTE — Progress Notes (Signed)
Patient ID: Adrienne Phillips, female   DOB: [birth year], 50 y.o.   MRN: [id number]   Subjective:    Patient ID: Adrienne Phillips, female    DOB: [birth year], 50 y.o.   MRN: [id number]  HPI This visit occurred during the SARS-CoV-2 public health emergency.  Safety protocols were in place, including screening questions prior to the visit, additional usage of staff PPE, and extensive cleaning of exam room while observing appropriate contact time as indicated for disinfecting solutions.  Patient here for her physical exam.  She reports she is doing relatively well.  Working from home.  Recently finished her home.  Handling stress ok.  Exercising.  No chest pain or sob.  No acid reflux or abdominal pain or cramping reported.  Bowels stable.  No period 12/2019 or 01/2020.  Had period 02/2020 - lasted 9 days.  LMP 03/28/20 - lasted 9 days.  Heavy periods.  Planning to start taking liquid iron. Eating molasses.  Previous side pain resolved.  She felt was msk in origin.  Resolved.  Seeing chiropractor for her left him.  Helping. Doing more yoga.  Walking - makes him better.  Noticed soreness/palpable area left breast.     Past Medical History:  Diagnosis Date  . Anemia   . Hypoglycemia   . Iron deficiency   . Migraine   . Ovarian cyst    Past Surgical History:  Procedure Laterality Date  . BREAST CYST ASPIRATION Left 1998   Family History  Problem Relation Age of Onset  . Hypertension Mother        migraines  . Cancer Father        colon and a hx of skin  . Breast cancer Sister 21  . Rheum arthritis Brother   . Osteoporosis Paternal Grandmother   . Ulcers Paternal Grandfather   . Breast cancer Maternal Aunt 70  . Breast cancer Paternal Aunt    Social History   Socioeconomic History  . Marital status: Married    Spouse name: Not on file  . Number of children: 0  . Years of education: Not on file  . Highest education level: Not on file  Occupational History    Employer: [employer]  Tobacco  Use  . Smoking status: Never Smoker  . Smokeless tobacco: Never Used  Vaping Use  . Vaping Use: Never used  Substance and Sexual Activity  . Alcohol use: Yes    Alcohol/week: 0.0 standard drinks  . Drug use: No  . Sexual activity: Not on file  Other Topics Concern  . Not on file  Social History Narrative  . Not on file   Social Determinants of Health   Financial Resource Strain: Not on file  Food Insecurity: Not on file  Transportation Needs: Not on file  Physical Activity: Not on file  Stress: Not on file  Social Connections: Not on file    Outpatient Encounter Medications as of 04/19/2020  Medication Sig  . ALPRAZolam (XANAX) 0.25 MG tablet Take 1 tablet (0.25 mg total) by mouth daily as needed for sleep.  . cyanocobalamin 2000 MCG tablet Take 2,000 mcg by mouth daily.  . Ferrous Sulfate (IRON PO) Take by mouth.  . meloxicam (MOBIC) 15 MG tablet Take 1 tablet (15 mg total) by mouth daily as needed for pain.  . metaxalone (SKELAXIN) 800 MG tablet TAKE 1/2 TO 1 TABLET BY MOUTH AT BEDTIME AS NEEDED FOR BACK SPASMS  . metroNIDAZOLE (METROGEL VAGINAL) 0.75 % vaginal gel Place 1  Applicatorful vaginally 2 (two) times daily.  . Multiple Vitamin (MULTIVITAMIN) capsule Take 1 capsule by mouth daily.  . mupirocin ointment (BACTROBAN) 2 % Apply 1 application topically 2 (two) times daily. Left middle finger as needed  . Vitamin D, Ergocalciferol, (DRISDOL) 50000 units CAPS capsule Take 1 capsule (50,000 Units total) by mouth every 7 (seven) days.   No facility-administered encounter medications on file as of 04/19/2020.    Review of Systems  Constitutional: Negative for appetite change and unexpected weight change.  HENT: Negative for congestion, sinus pressure and sore throat.   Eyes: Negative for pain and visual disturbance.  Respiratory: Negative for cough, chest tightness and shortness of breath.   Cardiovascular: Negative for chest pain, palpitations and leg swelling.   Gastrointestinal: Negative for abdominal pain, diarrhea, nausea and vomiting.  Genitourinary: Negative for difficulty urinating and dysuria.  Musculoskeletal: Negative for joint swelling and myalgias.       Left hip pain - better - seeing chiropractor.    Skin: Negative for color change and rash.  Neurological: Negative for dizziness, light-headedness and headaches.  Hematological: Negative for adenopathy. Does not bruise/bleed easily.  Psychiatric/Behavioral: Negative for agitation and dysphoric mood.       Objective:    Physical Exam Vitals reviewed.  Constitutional:      General: She is not in acute distress.    Appearance: Normal appearance.  HENT:     Head: Normocephalic and atraumatic.     Right Ear: External ear normal.     Left Ear: External ear normal.  Eyes:     General: No scleral icterus.       Right eye: No discharge.        Left eye: No discharge.     Conjunctiva/sclera: Conjunctivae normal.  Neck:     Thyroid: No thyromegaly.  Cardiovascular:     Rate and Rhythm: Normal rate and regular rhythm.  Pulmonary:     Effort: No respiratory distress.     Breath sounds: Normal breath sounds. No wheezing.     Comments: Breasts:  No nipple discharge or nipple retraction present.  Increased pain and question of palpable fullness 2:00 left breast.  No other distinct nodules or axillary adenopathy.   Abdominal:     General: Bowel sounds are normal.     Palpations: Abdomen is soft.     Tenderness: There is no abdominal tenderness.  Musculoskeletal:        General: No swelling or tenderness.     Cervical back: Neck supple. No tenderness.  Lymphadenopathy:     Cervical: No cervical adenopathy.  Skin:    Findings: No erythema or rash.  Neurological:     Mental Status: She is alert.  Psychiatric:        Mood and Affect: Mood normal.        Behavior: Behavior normal.     BP 106/68   Pulse 64   Temp 97.6 F (36.4 C) (Oral)   Resp 16   Ht 5\' 7"  (1.702 m)   Wt  140 lb (63.5 kg)   SpO2 99%   BMI 21.93 kg/m  Wt Readings from Last 3 Encounters:  04/19/20 140 lb (63.5 kg)  02/15/20 145 lb (65.8 kg)  09/10/19 138 lb (62.6 kg)     Lab Results  Component Value Date   WBC 3.2 (L) 04/19/2020   HGB 12.6 04/19/2020   HCT 37.7 04/19/2020   PLT 245.0 04/19/2020   GLUCOSE 93 04/19/2020   CHOL  217 (H) 04/19/2020   TRIG 36.0 04/19/2020   HDL 98.10 04/19/2020   LDLCALC 112 (H) 04/19/2020   ALT 16 04/19/2020   AST 22 04/19/2020   NA 136 04/19/2020   K 4.5 04/19/2020   CL 101 04/19/2020   CREATININE 0.66 04/19/2020   BUN 15 04/19/2020   CO2 28 04/19/2020   TSH 1.91 02/15/2020   HGBA1C 5.4 11/02/2015       Assessment & Plan:   Problem List Items Addressed This Visit    Anemia    Periods as outlined.  Check cbc and iron studies today.  She is planning to start liquid iron.       Relevant Orders   Comprehensive metabolic panel (Completed)   CBC with Differential/Platelet (Completed)   IBC + Ferritin (Completed)   Vitamin B12 (Completed)   Breast tenderness    Left breast tenderness and question of fullness left breast 2:00.  Schedule diagnostic mammogram and possible ultrasound.  Due screening.        Relevant Orders   MM DIAG BREAST TOMO BILATERAL   US BREAST LTD UNI LEFT INC AXILLA   Health care maintenance    Physical today 04/19/20.  PAP 03/11/18 - negative with negative HPV.  Due mammogram.  Schedule diagnostic as outlined.  Colonoscopy 2018.       History of colonic polyps    Colonoscopy 2018.        Leukopenia    Follow cbc.         Other Visit Diagnoses    Routine general medical examination at a health care facility    -  Primary   Visit for screening mammogram       Screening cholesterol level       Relevant Orders   Lipid panel (Completed)       Dale [place], MD

## 2020-04-19 NOTE — Assessment & Plan Note (Signed)
Follow cbc.  

## 2020-04-19 NOTE — Assessment & Plan Note (Signed)
Left breast tenderness and question of fullness left breast 2:00.  Schedule diagnostic mammogram and possible ultrasound.  Due screening.

## 2020-04-19 NOTE — Assessment & Plan Note (Signed)
Colonoscopy 2018.  

## 2020-04-19 NOTE — Assessment & Plan Note (Signed)
Physical today 04/19/20.  PAP 03/11/18 - negative with negative HPV.  Due mammogram.  Schedule diagnostic as outlined.  Colonoscopy 2018.

## 2020-04-20 ENCOUNTER — Other Ambulatory Visit: Payer: Self-pay | Admitting: Internal Medicine

## 2020-04-20 DIAGNOSIS — D649 Anemia, unspecified: Secondary | ICD-10-CM

## 2020-04-20 NOTE — Progress Notes (Signed)
Order placed for f/u labs.  

## 2020-04-23 ENCOUNTER — Other Ambulatory Visit: Payer: Self-pay | Admitting: Internal Medicine

## 2020-04-23 DIAGNOSIS — Z13220 Encounter for screening for lipoid disorders: Secondary | ICD-10-CM

## 2020-04-23 NOTE — Progress Notes (Signed)
Order placed for f/u cholesterol check.

## 2020-05-15 ENCOUNTER — Ambulatory Visit
Admission: RE | Admit: 2020-05-15 | Discharge: 2020-05-15 | Disposition: A | Discharge status: Home / Self Care | Payer: BC Managed Care – PPO | Source: Ambulatory Visit | Attending: Internal Medicine | Admitting: Internal Medicine

## 2020-05-15 ENCOUNTER — Other Ambulatory Visit: Payer: Self-pay

## 2020-05-15 DIAGNOSIS — N644 Mastodynia: Secondary | ICD-10-CM

## 2020-07-24 ENCOUNTER — Other Ambulatory Visit: Payer: BC Managed Care – PPO

## 2020-08-14 IMAGING — MG DIGITAL SCREENING BILAT W/ TOMO W/ CAD
6 of 10 series · 6 of 30 positions shown · non-contrast
Comparison: Previous exam(s).

CLINICAL DATA: Screening.

EXAM:
DIGITAL SCREENING BILATERAL MAMMOGRAM WITH TOMO AND CAD

[R MLO synth-2D (1 of 2)]
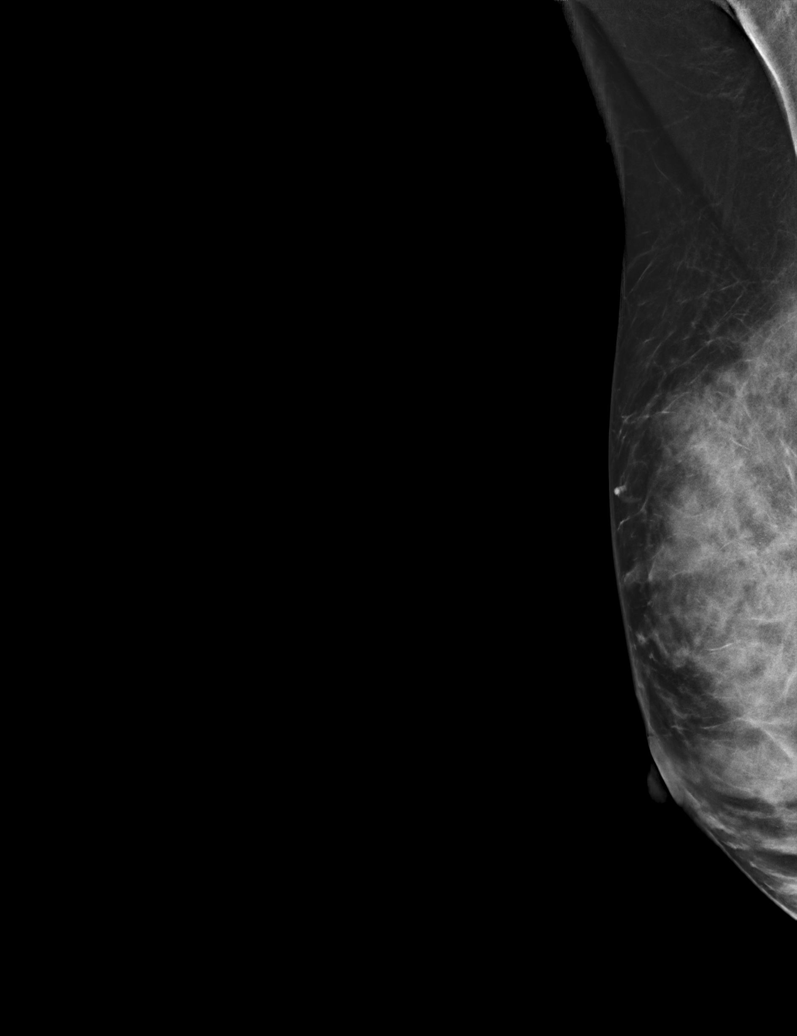

[R MLO synth-2D (2 of 2)]
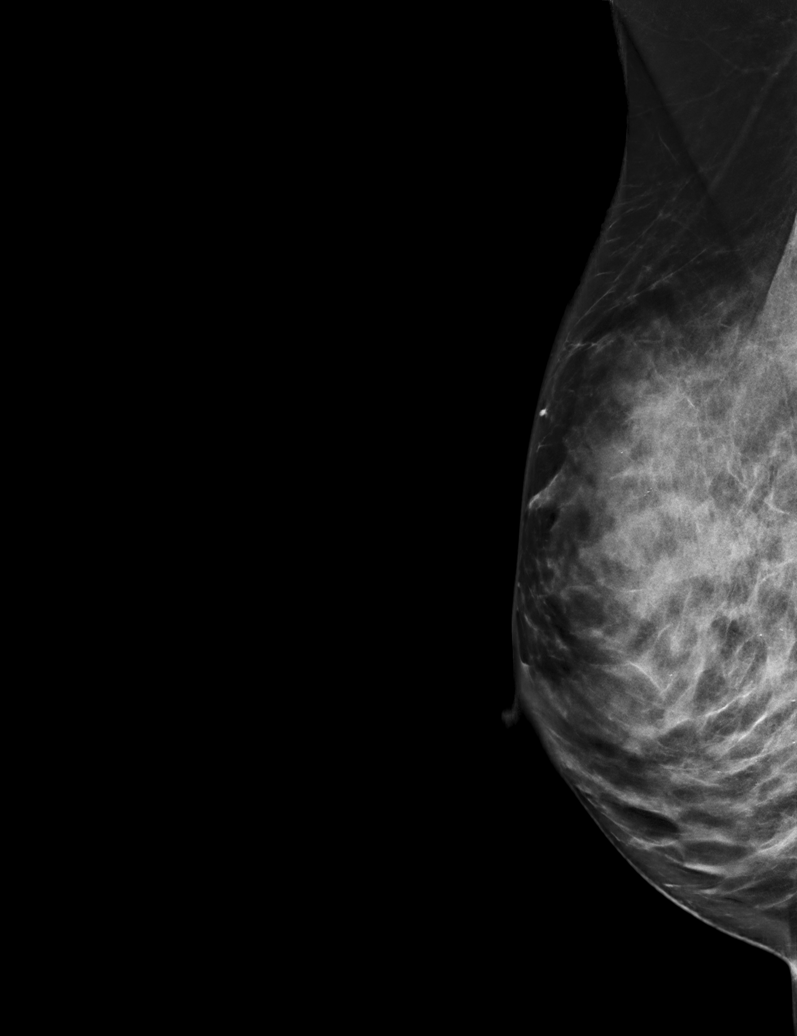

[L MLO synth-2D]
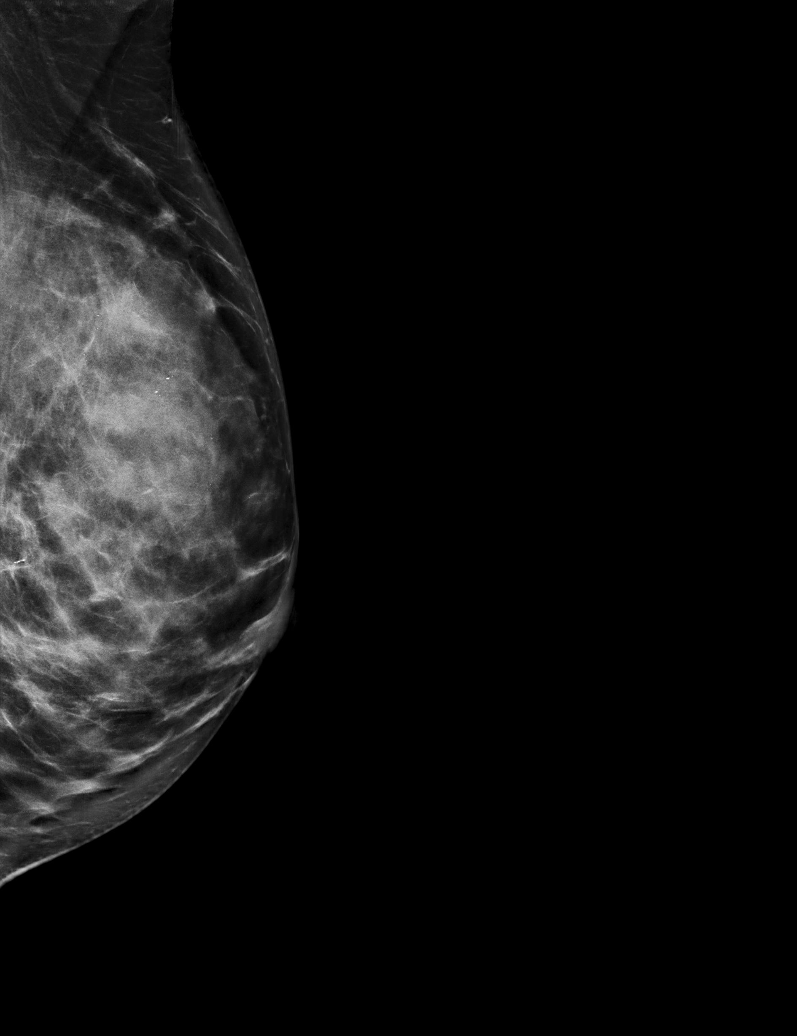

[L CC synth-2D]
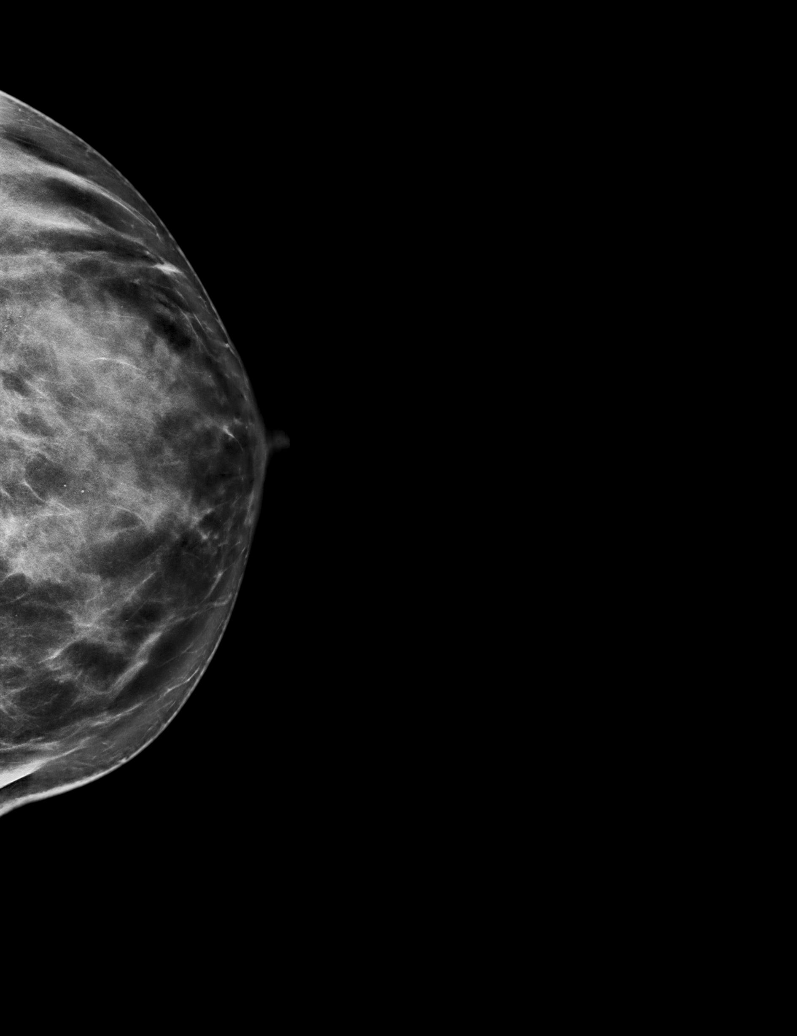

[R CC synth-2D]
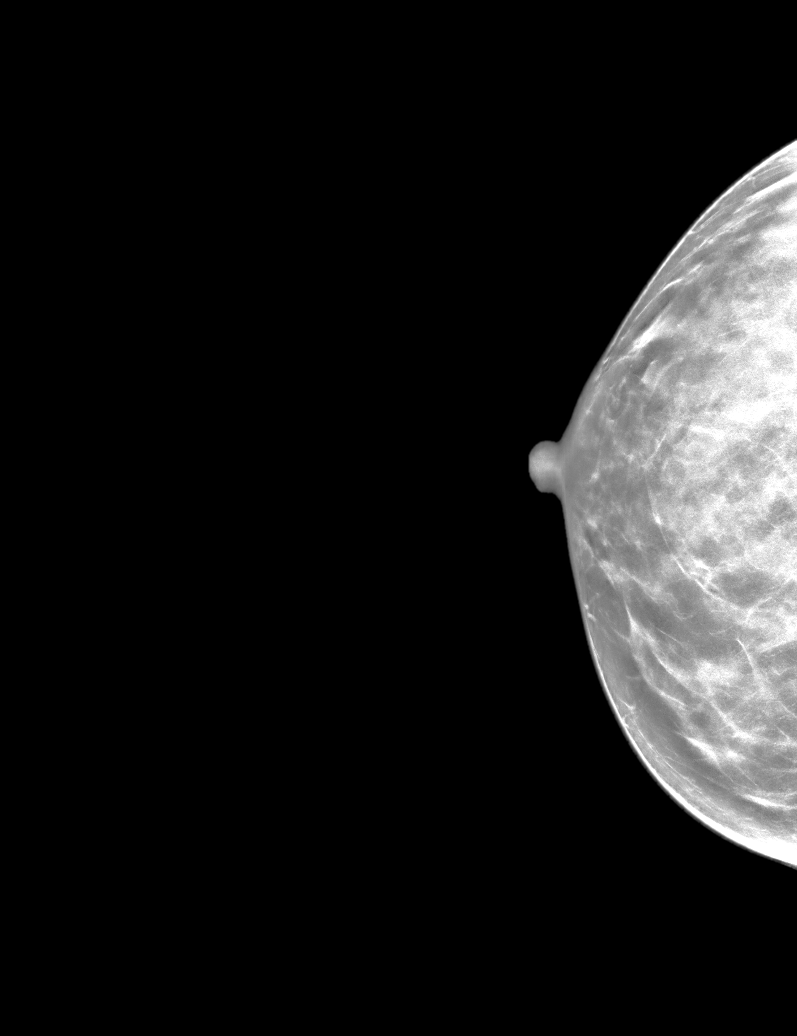

[L MLO tomo · tomo slice 39/77.0]
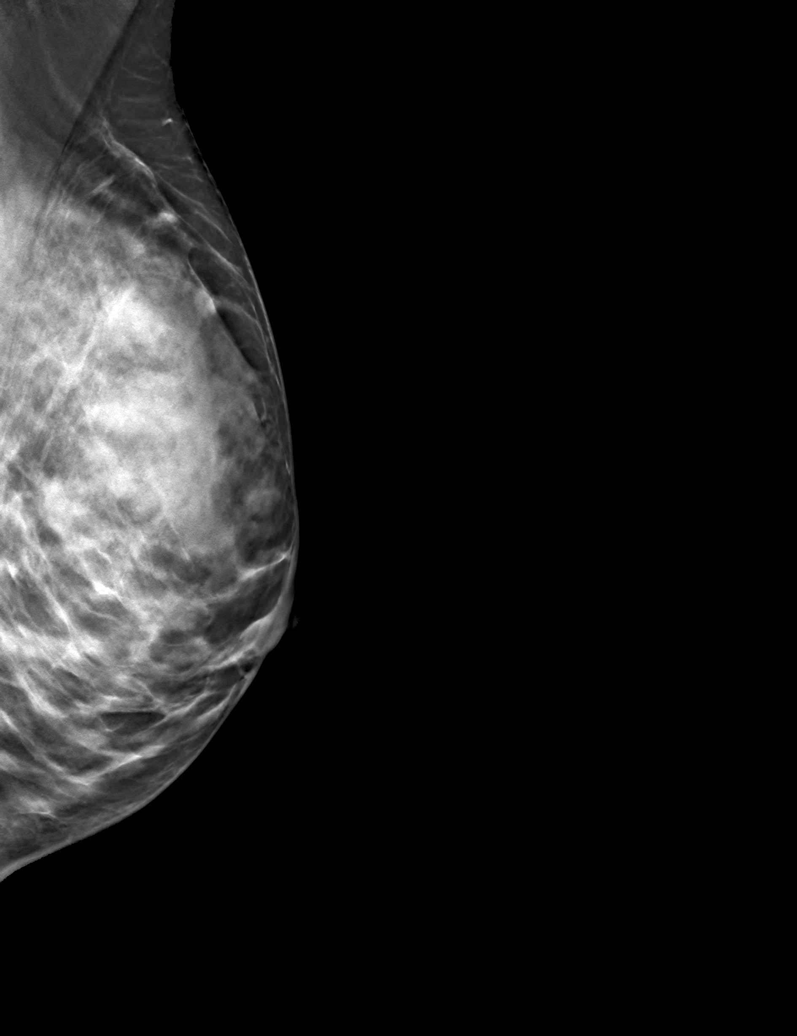

[6 of 30 positions shown; findings below may reference images not displayed]

ACR Breast Density Category d: The breast tissue is extremely dense,
which lowers the sensitivity of mammography.
FINDINGS: In the left breast, a possible mass warrants further evaluation. In
the right breast, no findings suspicious for malignancy.

Images were processed with CAD.
IMPRESSION: Further evaluation is suggested for possible mass in the left
breast.

RECOMMENDATION:
Diagnostic mammogram and possibly ultrasound of the left breast.
(Code:Q4-R-FFC)

The patient will be contacted regarding the findings, and additional
imaging will be scheduled.

BI-RADS CATEGORY  0: Incomplete. Need additional imaging evaluation
and/or prior mammograms for comparison.

## 2020-09-06 ENCOUNTER — Other Ambulatory Visit: Payer: Self-pay

## 2020-09-06 ENCOUNTER — Other Ambulatory Visit (INDEPENDENT_AMBULATORY_CARE_PROVIDER_SITE_OTHER): Payer: BC Managed Care – PPO

## 2020-09-06 DIAGNOSIS — Z13220 Encounter for screening for lipoid disorders: Secondary | ICD-10-CM | POA: Diagnosis not present

## 2020-09-06 DIAGNOSIS — D649 Anemia, unspecified: Secondary | ICD-10-CM

## 2020-09-06 HISTORY — PX: BREAST CYST ASPIRATION: SHX578

## 2020-09-06 LAB — LIPID PANEL
Cholesterol: 199 mg/dL (ref 0–200)
HDL: 109.3 mg/dL (ref >39.00)
LDL Cholesterol: 83 mg/dL (ref 0–99)
NonHDL: 90.11
Total CHOL/HDL Ratio: 2
Triglycerides: 38 mg/dL (ref 0.0–149.0)
VLDL: 7.6 mg/dL (ref 0.0–40.0)

## 2020-09-06 LAB — CBC WITH DIFFERENTIAL/PLATELET
Basophils Absolute: 0.1 10*3/uL (ref 0.0–0.1)
Basophils Relative: 2.6 % (ref 0.0–3.0)
Eosinophils Absolute: 0.1 10*3/uL (ref 0.0–0.7)
Eosinophils Relative: 2.6 % (ref 0.0–5.0)
HCT: 39.5 % (ref 36.0–46.0)
Hemoglobin: 13.2 g/dL (ref 12.0–15.0)
Lymphocytes Relative: 31.7 % (ref 12.0–46.0)
Lymphs Abs: 0.9 10*3/uL (ref 0.7–4.0)
MCHC: 33.4 g/dL (ref 30.0–36.0)
MCV: 89.6 fl (ref 78.0–100.0)
Monocytes Absolute: 0.3 10*3/uL (ref 0.1–1.0)
Monocytes Relative: 12.3 % — ABNORMAL HIGH (ref 3.0–12.0)
Neutro Abs: 1.4 10*3/uL (ref 1.4–7.7)
Neutrophils Relative %: 50.8 % (ref 43.0–77.0)
Platelets: 232 10*3/uL (ref 150.0–400.0)
RBC: 4.41 Mil/uL (ref 3.87–5.11)
RDW: 14 % (ref 11.5–15.5)
WBC: 2.7 10*3/uL — ABNORMAL LOW (ref 4.0–10.5)

## 2020-09-06 LAB — IBC + FERRITIN
Ferritin: 11.4 ng/mL (ref 10.0–291.0)
Iron: 71 ug/dL (ref 42–145)
Saturation Ratios: 16.3 % — ABNORMAL LOW (ref 20.0–50.0)
TIBC: 436.8 ug/dL (ref 250.0–450.0)
Transferrin: 312 mg/dL (ref 212.0–360.0)

## 2020-09-07 NOTE — Addendum Note (Signed)
Addended by: Elise Benne T on: 09/07/2020 10:06 AM   Modules accepted: Orders

## 2020-09-12 ENCOUNTER — Encounter: Payer: Self-pay | Admitting: Internal Medicine

## 2020-09-13 NOTE — Telephone Encounter (Signed)
Advised that her decreased white count is a slight decrease- not significant and we could see results in 4 weeks to determine if anything further needs to be done. Patient was okay with this and also advised that you are out of the office- I did not want her to worry. Forwarding to you in case there is anything you want to add.

## 2020-09-13 NOTE — Telephone Encounter (Signed)
Thank you.  You can let her know that the count can vary up and down.  We will need to continue to follow.  One thing, increased alcohol can affect.  Make sure she is not drinking an increased amount of alcohol.  Confirm she is feeling ok.  No recent infection.

## 2020-09-14 NOTE — Telephone Encounter (Signed)
Pt aware of below.

## 2020-10-05 ENCOUNTER — Other Ambulatory Visit (INDEPENDENT_AMBULATORY_CARE_PROVIDER_SITE_OTHER): Payer: BC Managed Care – PPO

## 2020-10-05 ENCOUNTER — Other Ambulatory Visit: Payer: Self-pay

## 2020-10-05 DIAGNOSIS — D649 Anemia, unspecified: Secondary | ICD-10-CM | POA: Diagnosis not present

## 2020-10-05 LAB — CBC WITH DIFFERENTIAL/PLATELET
Basophils Absolute: 0.1 10*3/uL (ref 0.0–0.1)
Basophils Relative: 2.3 % (ref 0.0–3.0)
Eosinophils Absolute: 0 10*3/uL (ref 0.0–0.7)
Eosinophils Relative: 1.5 % (ref 0.0–5.0)
HCT: 36.3 % (ref 36.0–46.0)
Hemoglobin: 12.1 g/dL (ref 12.0–15.0)
Lymphocytes Relative: 27.4 % (ref 12.0–46.0)
Lymphs Abs: 0.8 10*3/uL (ref 0.7–4.0)
MCHC: 33.3 g/dL (ref 30.0–36.0)
MCV: 89.3 fl (ref 78.0–100.0)
Monocytes Absolute: 0.4 10*3/uL (ref 0.1–1.0)
Monocytes Relative: 14.6 % — ABNORMAL HIGH (ref 3.0–12.0)
Neutro Abs: 1.5 10*3/uL (ref 1.4–7.7)
Neutrophils Relative %: 54.2 % (ref 43.0–77.0)
Platelets: 220 10*3/uL (ref 150.0–400.0)
RBC: 4.07 Mil/uL (ref 3.87–5.11)
RDW: 14.1 % (ref 11.5–15.5)
WBC: 2.8 10*3/uL — ABNORMAL LOW (ref 4.0–10.5)

## 2020-10-19 ENCOUNTER — Ambulatory Visit (INDEPENDENT_AMBULATORY_CARE_PROVIDER_SITE_OTHER): Payer: BC Managed Care – PPO | Admitting: Internal Medicine

## 2020-10-19 ENCOUNTER — Telehealth: Payer: Self-pay

## 2020-10-19 ENCOUNTER — Encounter: Payer: Self-pay | Admitting: Internal Medicine

## 2020-10-19 ENCOUNTER — Other Ambulatory Visit: Payer: Self-pay

## 2020-10-19 VITALS — BP 104/64 | HR 76 | Temp 97.8°F | Resp 16 | Ht 67.0 in | Wt 143.0 lb

## 2020-10-19 DIAGNOSIS — D72819 Decreased white blood cell count, unspecified: Secondary | ICD-10-CM | POA: Diagnosis not present

## 2020-10-19 DIAGNOSIS — N926 Irregular menstruation, unspecified: Secondary | ICD-10-CM | POA: Diagnosis not present

## 2020-10-19 DIAGNOSIS — Z8249 Family history of ischemic heart disease and other diseases of the circulatory system: Secondary | ICD-10-CM

## 2020-10-19 DIAGNOSIS — M542 Cervicalgia: Secondary | ICD-10-CM | POA: Diagnosis not present

## 2020-10-19 DIAGNOSIS — F439 Reaction to severe stress, unspecified: Secondary | ICD-10-CM

## 2020-10-19 DIAGNOSIS — Z23 Encounter for immunization: Secondary | ICD-10-CM | POA: Diagnosis not present

## 2020-10-19 DIAGNOSIS — D649 Anemia, unspecified: Secondary | ICD-10-CM

## 2020-10-19 DIAGNOSIS — Z86010 Personal history of colonic polyps: Secondary | ICD-10-CM

## 2020-10-19 DIAGNOSIS — K2270 Barrett's esophagus without dysplasia: Secondary | ICD-10-CM

## 2020-10-19 NOTE — Progress Notes (Signed)
Patient ID: Adrienne Phillips, female   DOB: 1970/10/15, 50 y.o.   MRN: [id number]   Subjective:    Patient ID: Adrienne Phillips, female    DOB: 01-25-1970, 50 y.o.   MRN: [id number]  This visit occurred during the SARS-CoV-2 public health emergency.  Safety protocols were in place, including screening questions prior to the visit, additional usage of staff PPE, and extensive cleaning of exam room while observing appropriate contact time as indicated for disinfecting solutions.   Patient here for a scheduled follow up.    HPI Follow up regarding anemia, leukopenia.  Also reports that has previously noticed - throat tight.  Lose voice.  Talk in a meeting - hurts. No drainage.  No acid reflux.  No chest congestion or cough.  No fever. No sob.  Appears to be doing ok.  Exercising.  No chest pain with increased activity or exertion.  No abdominal pain.  No actual period since 02/2020.  Does report spotting 08/2020.  No significant hot flashes.  Reports mother's side of family - history of aneurysms.  Brother "heart aneurysm".     Past Medical History:  Diagnosis Date   Anemia    Hypoglycemia    Iron deficiency    Migraine    Ovarian cyst    Past Surgical History:  Procedure Laterality Date   BREAST CYST ASPIRATION Left 1998   Family History  Problem Relation Age of Onset   Hypertension Mother        migraines   Cancer Father        colon and a hx of skin   Breast cancer Sister 49   Rheum arthritis Brother    Osteoporosis Paternal Grandmother    Ulcers Paternal Grandfather    Breast cancer Maternal Aunt 57   Breast cancer Paternal Aunt    Social History   Socioeconomic History   Marital status: Married    Spouse name: Not on file   Number of children: 0   Years of education: Not on file   Highest education level: Not on file  Occupational History    Employer: [employer]  Tobacco Use   Smoking status: Never   Smokeless tobacco: Never  Vaping Use   Vaping Use: Never used   Substance and Sexual Activity   Alcohol use: Yes    Alcohol/week: 0.0 standard drinks   Drug use: No   Sexual activity: Not on file  Other Topics Concern   Not on file  Social History Narrative   Not on file   Social Determinants of Health   Financial Resource Strain: Not on file  Food Insecurity: Not on file  Transportation Needs: Not on file  Physical Activity: Not on file  Stress: Not on file  Social Connections: Not on file     Review of Systems  Constitutional:  Negative for appetite change and unexpected weight change.  HENT:  Negative for congestion and sinus pressure.   Respiratory:  Negative for cough, chest tightness and shortness of breath.   Cardiovascular:  Negative for chest pain and palpitations.  Gastrointestinal:  Negative for abdominal pain, diarrhea, nausea and vomiting.  Genitourinary:  Negative for difficulty urinating and dysuria.  Musculoskeletal:  Negative for joint swelling and myalgias.  Skin:  Negative for color change and rash.  Neurological:  Negative for dizziness, light-headedness and headaches.  Psychiatric/Behavioral:  Negative for agitation and dysphoric mood.       Objective:     BP 104/64   Pulse  76   Temp 97.8 F (36.6 C)   Resp 16   Ht 5\' 7"  (1.702 m)   Wt 143 lb (64.9 kg)   SpO2 99%   BMI 22.40 kg/m  Wt Readings from Last 3 Encounters:  10/19/20 143 lb (64.9 kg)  04/19/20 140 lb (63.5 kg)  02/15/20 145 lb (65.8 kg)    Physical Exam   Outpatient Encounter Medications as of 10/19/2020  Medication Sig   ALPRAZolam (XANAX) 0.25 MG tablet Take 1 tablet (0.25 mg total) by mouth daily as needed for sleep.   cyanocobalamin 2000 MCG tablet Take 2,000 mcg by mouth daily.   Ferrous Sulfate (IRON PO) Take by mouth.   meloxicam (MOBIC) 15 MG tablet Take 1 tablet (15 mg total) by mouth daily as needed for pain.   metaxalone (SKELAXIN) 800 MG tablet TAKE 1/2 TO 1 TABLET BY MOUTH AT BEDTIME AS NEEDED FOR BACK SPASMS    metroNIDAZOLE (METROGEL VAGINAL) 0.75 % vaginal gel Place 1 Applicatorful vaginally 2 (two) times daily.   Multiple Vitamin (MULTIVITAMIN) capsule Take 1 capsule by mouth daily.   mupirocin ointment (BACTROBAN) 2 % Apply 1 application topically 2 (two) times daily. Left middle finger as needed   Vitamin D, Ergocalciferol, (DRISDOL) 50000 units CAPS capsule Take 1 capsule (50,000 Units total) by mouth every 7 (seven) days.   No facility-administered encounter medications on file as of 10/19/2020.     Lab Results  Component Value Date   WBC 2.8 (L) 10/05/2020   HGB 12.1 10/05/2020   HCT 36.3 10/05/2020   PLT 220.0 10/05/2020   GLUCOSE 93 04/19/2020   CHOL 199 09/06/2020   TRIG 38.0 09/06/2020   HDL 109.30 09/06/2020   LDLCALC 83 09/06/2020   ALT 16 04/19/2020   AST 22 04/19/2020   NA 136 04/19/2020   K 4.5 04/19/2020   CL 101 04/19/2020   CREATININE 0.66 04/19/2020   BUN 15 04/19/2020   CO2 28 04/19/2020   TSH 1.91 02/15/2020   HGBA1C 5.4 11/02/2015    MM DIAG BREAST TOMO BILATERAL  Result Date: 05/15/2020 CLINICAL DATA:  50 year old female with history of breast pain and cysts.For annual bilateral mammogram. EXAM: DIGITAL DIAGNOSTIC BILATERAL MAMMOGRAM WITH CAD AND TOMO COMPARISON:  Previous exam(s). ACR Breast Density Category c: The breast tissue is heterogeneously dense, which may obscure small masses. FINDINGS: 2D/3D full field views of both breasts demonstrate no suspicious mass, distortion or worrisome calcifications. Mammographic images were processed with CAD. IMPRESSION: No mammographic evidence of breast malignancy. RECOMMENDATION: Bilateral screening mammogram in 1 year. I have discussed the findings and recommendations with the patient. If applicable, a reminder letter will be sent to the patient regarding the next appointment. BI-RADS CATEGORY  1: Negative. Electronically Signed   By: 43 M.D.   On: 05/15/2020 10:33       Assessment & Plan:   Problem List  Items Addressed This Visit     Anemia    Follow cbc.  Decreased periods now.  Follow cbc and iron studies.        Barrett's esophagus    EGD 01/05/13 - esophageal mucosal changes secondary to short segment Barretts.  Erythema in the prepyloric region of the stomach.   Pathology:  Mild chronic gastritis and mild foveolar hyperplasia.  Negative h. Pylori.  Mild reflux.  Path - negative for Barretts.   Recommend repeat EGD 12/2017 Saw [facility] 2018.  Obtain records from latest EGD.  No acid reflux reported.  Family history of aneurysm    See if she can obtain more information regarding her family history.  Discussed screening ultrasound.        History of colonic polyps    Colonoscopy 2018.        Irregular periods    Last period 02/2020.  Spotting in august.  Follow.  Keep menstrual diary.        Leukopenia    Follow cbc.       Neck pain    Has a history of neck pain as outlined previously.  Appears to be stable.       Stress    Overall appears to be handling things well.  Follow.         Dale Kenova, MD

## 2020-10-19 NOTE — Telephone Encounter (Signed)
Flu shot letter printed. 

## 2020-10-28 ENCOUNTER — Encounter: Payer: Self-pay | Admitting: Internal Medicine

## 2020-10-28 ENCOUNTER — Telehealth: Payer: Self-pay | Admitting: Internal Medicine

## 2020-10-28 DIAGNOSIS — Z8249 Family history of ischemic heart disease and other diseases of the circulatory system: Secondary | ICD-10-CM | POA: Insufficient documentation

## 2020-10-28 NOTE — Assessment & Plan Note (Signed)
Follow cbc.  Decreased periods now.  Follow cbc and iron studies.

## 2020-10-28 NOTE — Assessment & Plan Note (Signed)
Follow cbc.  

## 2020-10-28 NOTE — Assessment & Plan Note (Signed)
Colonoscopy 2018.  

## 2020-10-28 NOTE — Assessment & Plan Note (Signed)
Last period 02/2020.  Spotting in august.  Follow.  Keep menstrual diary.

## 2020-10-28 NOTE — Assessment & Plan Note (Signed)
See if she can obtain more information regarding her family history.  Discussed screening ultrasound.

## 2020-10-28 NOTE — Assessment & Plan Note (Signed)
EGD 01/05/13 - esophageal mucosal changes secondary to short segment Barretts.  Erythema in the prepyloric region of the stomach.   Pathology:  Mild chronic gastritis and mild foveolar hyperplasia.  Negative h. Pylori.  Mild reflux.  Path - negative for Barretts.   Recommend repeat EGD 12/2017 Saw [facility] 2018.  Obtain records from latest EGD.  No acid reflux reported.

## 2020-10-28 NOTE — Telephone Encounter (Signed)
Has seen [facility].  Per note, it appears last EGD and colonoscopy 2018.  Need results and pathology results.  Also need any records or scopes past this date.

## 2020-10-28 NOTE — Assessment & Plan Note (Addendum)
Has a history of neck pain as outlined previously.  Appears to be stable.

## 2020-10-28 NOTE — Assessment & Plan Note (Signed)
Overall appears to be handling things well.  Follow.  ?

## 2020-10-29 NOTE — Telephone Encounter (Signed)
Requested from [facility]

## 2020-12-11 ENCOUNTER — Ambulatory Visit (INDEPENDENT_AMBULATORY_CARE_PROVIDER_SITE_OTHER): Payer: BC Managed Care – PPO | Admitting: Adult Health

## 2020-12-11 ENCOUNTER — Other Ambulatory Visit: Payer: Self-pay

## 2020-12-11 ENCOUNTER — Encounter: Payer: Self-pay | Admitting: Adult Health

## 2020-12-11 VITALS — BP 118/76 | HR 63 | Temp 96.9°F | Ht 67.01 in | Wt 150.0 lb

## 2020-12-11 DIAGNOSIS — N6323 Unspecified lump in the left breast, lower outer quadrant: Secondary | ICD-10-CM | POA: Diagnosis not present

## 2020-12-11 DIAGNOSIS — Z803 Family history of malignant neoplasm of breast: Secondary | ICD-10-CM

## 2020-12-11 DIAGNOSIS — D72819 Decreased white blood cell count, unspecified: Secondary | ICD-10-CM | POA: Diagnosis not present

## 2020-12-11 LAB — CBC WITH DIFFERENTIAL/PLATELET
Basophils Absolute: 0.1 10*3/uL (ref 0.0–0.1)
Basophils Relative: 3.8 % — ABNORMAL HIGH (ref 0.0–3.0)
Eosinophils Absolute: 0.1 10*3/uL (ref 0.0–0.7)
Eosinophils Relative: 2.1 % (ref 0.0–5.0)
HCT: 37.7 % (ref 36.0–46.0)
Hemoglobin: 12.2 g/dL (ref 12.0–15.0)
Lymphocytes Relative: 24.2 % (ref 12.0–46.0)
Lymphs Abs: 0.7 10*3/uL (ref 0.7–4.0)
MCHC: 32.4 g/dL (ref 30.0–36.0)
MCV: 91.2 fl (ref 78.0–100.0)
Monocytes Absolute: 0.4 10*3/uL (ref 0.1–1.0)
Monocytes Relative: 15.4 % — ABNORMAL HIGH (ref 3.0–12.0)
Neutro Abs: 1.6 10*3/uL (ref 1.4–7.7)
Neutrophils Relative %: 54.5 % (ref 43.0–77.0)
Platelets: 241 10*3/uL (ref 150.0–400.0)
RBC: 4.13 Mil/uL (ref 3.87–5.11)
RDW: 14.5 % (ref 11.5–15.5)
WBC: 2.9 10*3/uL — ABNORMAL LOW (ref 4.0–10.5)

## 2020-12-11 NOTE — Progress Notes (Signed)
Acute Office Visit  Subjective:    Patient ID: Adrienne Phillips, female    DOB: 1970-12-23, 50 y.o.   MRN: [id number]  Chief Complaint  Patient presents with   Breast Mass    Pt has knot on left breast that appeared two weeks ago. Pt states tender to the touch     HPI Patient is in today for a lump she felt in her left breast 2 weeks ago. Denies any nipple drainage.  She also started her menstrual at about the same time. She has not had a cycle February 2022 and was irregular before. Patient's last menstrual period was 11/27/2020.  Last Pap smear was within normal limits and was to 03/16/2018.  She was still having regular periods at office visit in February. She does have pain in her left breast at this area, no erythema no nipple discharge or nipple dimpling.  No change in bra size She has history of cyst in left breast removal and she feels this feels the same.  She has a sister with history of breast cancer.   Patient did have a mammogram diagnostic May 15, 2020 that showed no evidence of mammographic malignancy of breast and was advised to repeat bilateral screening mammogram in 1 year.  Patient  denies any fever, body aches,chills, rash, chest pain, shortness of breath, nausea, vomiting, or diarrhea.    Past Medical History:  Diagnosis Date   Anemia    Hypoglycemia    Iron deficiency    Migraine    Ovarian cyst     Past Surgical History:  Procedure Laterality Date   BREAST CYST ASPIRATION Left 1998    Family History  Problem Relation Age of Onset   Hypertension Mother        migraines   Cancer Father        colon and a hx of skin   Breast cancer Sister 18   Rheum arthritis Brother    Osteoporosis Paternal Grandmother    Ulcers Paternal Grandfather    Breast cancer Maternal Aunt 22   Breast cancer Paternal Aunt     Social History   Socioeconomic History   Marital status: Married    Spouse name: Not on file   Number of children: 0   Years of  education: Not on file   Highest education level: Not on file  Occupational History    Employer: [employer]  Tobacco Use   Smoking status: Never   Smokeless tobacco: Never  Vaping Use   Vaping Use: Never used  Substance and Sexual Activity   Alcohol use: Yes    Alcohol/week: 0.0 standard drinks   Drug use: No   Sexual activity: Not on file  Other Topics Concern   Not on file  Social History Narrative   Not on file   Social Determinants of Health   Financial Resource Strain: Not on file  Food Insecurity: Not on file  Transportation Needs: Not on file  Physical Activity: Not on file  Stress: Not on file  Social Connections: Not on file  Intimate Partner Violence: Not on file    Outpatient Medications Prior to Visit  Medication Sig Dispense Refill   ALPRAZolam (XANAX) 0.25 MG tablet Take 1 tablet (0.25 mg total) by mouth daily as needed for sleep. 15 tablet 0   cyanocobalamin 2000 MCG tablet Take 2,000 mcg by mouth daily.     Ferrous Sulfate (IRON PO) Take by mouth.     meloxicam (MOBIC) 15 MG  tablet Take 1 tablet (15 mg total) by mouth daily as needed for pain. 30 tablet 0   metaxalone (SKELAXIN) 800 MG tablet TAKE 1/2 TO 1 TABLET BY MOUTH AT BEDTIME AS NEEDED FOR BACK SPASMS 30 tablet 0   metroNIDAZOLE (METROGEL VAGINAL) 0.75 % vaginal gel Place 1 Applicatorful vaginally 2 (two) times daily. 70 g 0   Multiple Vitamin (MULTIVITAMIN) capsule Take 1 capsule by mouth daily.     mupirocin ointment (BACTROBAN) 2 % Apply 1 application topically 2 (two) times daily. Left middle finger as needed 30 g 0   Vitamin D, Ergocalciferol, (DRISDOL) 50000 units CAPS capsule Take 1 capsule (50,000 Units total) by mouth every 7 (seven) days. 8 capsule 0   No facility-administered medications prior to visit.    Allergies  Allergen Reactions   Codeine     Hallucination    Topamax [Topiramate]     Review of Systems     Objective:    Physical Exam Vitals reviewed.  Constitutional:       General: She is not in acute distress.    Appearance: Normal appearance. She is not ill-appearing or diaphoretic.     Comments: Patient is alert and oriented and responsive to questions Engages in eye contact with provider. Speaks in full sentences without any pauses without any shortness of breath or distress.     HENT:     Head: Normocephalic and atraumatic.     Nose: Nose normal.     Mouth/Throat:     Pharynx: Oropharynx is clear.  Eyes:     Pupils: Pupils are equal, round, and reactive to light.  Neck:     Vascular: No carotid bruit.  Cardiovascular:     Rate and Rhythm: Normal rate and regular rhythm.     Pulses: Normal pulses.     Heart sounds: Normal heart sounds.  Pulmonary:     Effort: Pulmonary effort is normal. No respiratory distress.     Breath sounds: Normal breath sounds. No stridor. No wheezing, rhonchi or rales.  Chest:     Chest wall: No tenderness.  Breasts:    Right: Normal. No swelling, bleeding, inverted nipple, mass, nipple discharge, skin change or tenderness.     Left: Mass present. No swelling, bleeding, inverted nipple, nipple discharge, skin change or tenderness.       Comments: Palpable round firm on outer edge otherwise soft mobile mass at 2 o'clock to 5 o'clock  Abdominal:     General: There is no distension.     Palpations: Abdomen is soft.     Tenderness: There is no abdominal tenderness.  Musculoskeletal:        General: Normal range of motion.     Cervical back: Normal range of motion and neck supple. No tenderness.  Lymphadenopathy:     Cervical: No cervical adenopathy.  Skin:    General: Skin is warm.     Findings: No erythema or rash.  Neurological:     Mental Status: She is alert and oriented to person, place, and time.  Psychiatric:        Mood and Affect: Mood normal.        Behavior: Behavior normal.        Thought Content: Thought content normal.        Judgment: Judgment normal.    BP 118/76   Pulse 63   Temp (!) 96.9 F  (36.1 C)   Ht 5' 7.01" (1.702 m)   Wt 150 lb (68  kg)   LMP 11/27/2020   SpO2 99%   BMI 23.49 kg/m  Wt Readings from Last 3 Encounters:  12/11/20 150 lb (68 kg)  10/19/20 143 lb (64.9 kg)  04/19/20 140 lb (63.5 kg)    Health Maintenance Due  Topic Date Due   TETANUS/TDAP  Never done    There are no preventive care reminders to display for this patient.   Lab Results  Component Value Date   TSH 1.91 02/15/2020   Lab Results  Component Value Date   WBC 2.8 (L) 10/05/2020   HGB 12.1 10/05/2020   HCT 36.3 10/05/2020   MCV 89.3 10/05/2020   PLT 220.0 10/05/2020   Lab Results  Component Value Date   NA 136 04/19/2020   K 4.5 04/19/2020   CO2 28 04/19/2020   GLUCOSE 93 04/19/2020   BUN 15 04/19/2020   CREATININE 0.66 04/19/2020   BILITOT 0.4 04/19/2020   ALKPHOS 52 04/19/2020   AST 22 04/19/2020   ALT 16 04/19/2020   PROT 6.9 04/19/2020   ALBUMIN 4.4 04/19/2020   CALCIUM 9.1 04/19/2020   ANIONGAP 13 03/17/2019   GFR 102.16 04/19/2020   Lab Results  Component Value Date   CHOL 199 09/06/2020   Lab Results  Component Value Date   HDL 109.30 09/06/2020   Lab Results  Component Value Date   LDLCALC 83 09/06/2020   Lab Results  Component Value Date   TRIG 38.0 09/06/2020   Lab Results  Component Value Date   CHOLHDL 2 09/06/2020   Lab Results  Component Value Date   HGBA1C 5.4 11/02/2015       Assessment & Plan:   Problem List Items Addressed This Visit       Other   Leukopenia - Primary   Relevant Orders   CBC with Differential/Platelet   Other Visit Diagnoses     Mass of lower outer quadrant of left breast       Relevant Orders   MM 3D SCREEN BREAST BILATERAL   US BREAST LTD UNI LEFT INC AXILLA   US BREAST LTD UNI RIGHT INC AXILLA   Family history of breast cancer in first degree relative       Relevant Orders   US BREAST LTD UNI RIGHT INC AXILLA      Leukopenia history, will recheck CBC, will order diagnostic mammogram of  breast and ultrasound bilaterally. Follow-up with Dr. Lorin Picket for primary care routine health maintenance, if any irregular vaginal bleeding occurs she should schedule a follow-up. Return if symptoms worsen or fail to improve, for at any time for any worsening symptoms, Go to Emergency room/ urgent care if worse. Red Flags discussed. The patient was given clear instructions to go to ER or return to medical center if any red flags develop, symptoms do not improve, worsen or new problems develop. They verbalized understanding.   No orders of the defined types were placed in this encounter.    Jairo Ben, FNP

## 2020-12-11 NOTE — Addendum Note (Signed)
Addended by: Berniece Pap on: 12/11/2020 03:22 PM   Modules accepted: Orders

## 2020-12-11 NOTE — Patient Instructions (Signed)
Breast Self-Awareness Breast self-awareness means being familiar with how your breasts look and feel. It involves checking your breasts regularly and reporting any changes to your health care provider. Practicing breast self-awareness is important. Sometimes changes may not be harmful (are benign), but sometimes a change in your breasts can be a sign of a serious medical problem. It is important to learn how to do this procedure correctly so that you can catch problems early, when treatment is more likely to be successful. All women should practice breast self-awareness, including women who have had breast implants. What you need: A mirror. A well-lit room. How to do a breast self-exam A breast self-exam is one way to learn what is normal for your breasts and whether your breasts are changing. To do a breast self-exam: Look for changes  Remove all the clothing above your waist. Stand in front of a mirror in a room with good lighting. Put your hands on your hips. Push your hands firmly downward. Compare your breasts in the mirror. Look for differences between them (asymmetry), such as: Differences in shape. Differences in size. Puckers, dips, and bumps in one breast and not the other. Look at each breast for changes in the skin, such as: Redness. Scaly areas. Look for changes in your nipples, such as: Discharge. Bleeding. Dimpling. Redness. A change in position. Feel for changes Carefully feel your breasts for lumps and changes. It is best to do this while lying on your back on the floor, and again while sitting or standing in the tub or shower with soapy water on your skin. Feel each breast in the following way: Place the arm on the side of the breast you are examining above your head. Feel your breast with the other hand. Start in the nipple area and make -inch (2 cm) overlapping circles to feel your breast. Use the pads of your three middle fingers to do this. Apply light pressure,  then medium pressure, then firm pressure. The light pressure will allow you to feel the tissue closest to the skin. The medium pressure will allow you to feel the tissue that is a little deeper. The firm pressure will allow you to feel the tissue close to the ribs. Continue the overlapping circles, moving downward over the breast until you feel your ribs below your breast. Move one finger-width toward the center of the body. Continue to use the -inch (2 cm) overlapping circles to feel your breast as you move slowly up toward your collarbone. Continue the up-and-down exam using all three pressures until you reach your armpit.  Write down what you find Writing down what you find can help you remember what to discuss with your health care provider. Write down: What is normal for each breast. Any changes that you find in each breast, including: The kind of changes you find. Any pain or tenderness. Size and location of any lumps. Where you are in your menstrual cycle, if you are still menstruating. General tips and recommendations Examine your breasts every month. If you are breastfeeding, the best time to examine your breasts is after a feeding or after using a breast pump. If you menstruate, the best time to examine your breasts is 5-7 days after your period. Breasts are generally lumpier during menstrual periods, and it may be more difficult to notice changes. With time and practice, you will become more familiar with the variations in your breasts and more comfortable with the exam. Contact a health care provider if you:  See a change in the shape or size of your breasts or nipples. See a change in the skin of your breast or nipples, such as a reddened or scaly area. Have unusual discharge from your nipples. Find a lump or thick area that was not there before. Have pain in your breasts. Have any concerns related to your breast health. Summary Breast self-awareness includes looking for  physical changes in your breasts, as well as feeling for any changes within your breasts. Breast self-awareness should be performed in front of a mirror in a well-lit room. You should examine your breasts every month. If you menstruate, the best time to examine your breasts is 5-7 days after your menstrual period. Let your health care provider know of any changes you notice in your breasts, including changes in size, changes on the skin, pain or tenderness, or unusual fluid from your nipples. This information is not intended to replace advice given to you by your health care provider. Make sure you discuss any questions you have with your health care provider. Document Revised: 08/25/2017 Document Reviewed: 08/25/2017 Elsevier Patient Education  2022 [employer] Breast Tenderness Breast tenderness is a common problem for women of all ages, but may also occur in men. Breast tenderness may range from mild discomfort to severe pain. In women, the pain usually comes and goes with the menstrual cycle, but it can also be constant. Breast tenderness has many possible causes, including hormone changes, infections, and taking certain medicines. You may have tests, such as a mammogram or an ultrasound, to check for any unusual findings. Having breast tenderness usually does not mean that you have breast cancer. Follow these instructions at home: Managing pain and discomfort  If directed, put ice to the painful area. To do this: Put ice in a plastic bag. Place a towel between your skin and the bag. Leave the ice on for 20 minutes, 2-3 times a day. Wear a supportive bra, especially during exercise. You may also want to wear a supportive bra while sleeping if your breasts are very tender. Medicines Take over-the-counter and prescription medicines only as told by your health care provider. If the cause of your pain is infection, you may be prescribed an antibiotic medicine. If you were prescribed an  antibiotic, take it as told by your health care provider. Do not stop taking the antibiotic even if you start to feel better. Eating and drinking Your health care provider may recommend that you lessen the amount of fat in your diet. You can do this by: Limiting fried foods. Cooking foods using methods such as baking, boiling, grilling, and broiling. Decrease the amount of caffeine in your diet. Instead, drink more water and choose caffeine-free drinks. General instructions  Keep a log of the days and times when your breasts are most tender. Ask your health care provider how to do breast exams at home. This will help you notice if you have an unusual growth or lump. Keep all follow-up visits as told by your health care provider. This is important. Contact a health care provider if: Any part of your breast is hard, red, and hot to the touch. This may be a sign of infection. You are a woman and: Not breastfeeding and you have fluid, especially blood or pus, coming out of your nipples. Have a new or painful lump in your breast that remains after your menstrual period ends. You have a fever. Your pain does not improve or it gets worse. Your pain  is interfering with your daily activities. Summary Breast tenderness may range from mild discomfort to severe pain. Breast tenderness has many possible causes, including hormone changes, infections, and taking certain medicines. It can be treated with ice, wearing a supportive bra, and medicines. Make changes to your diet if told to by your health care provider. This information is not intended to replace advice given to you by your health care provider. Make sure you discuss any questions you have with your health care provider. Document Revised: 05/31/2018 Document Reviewed: 05/31/2018 Elsevier Patient Education  2022 [employer].

## 2020-12-11 NOTE — Progress Notes (Signed)
CBC chronically low WBC, rechecked today, she was seen for suspected breast cyst, tests ordered.  Consider hematology referral

## 2020-12-12 ENCOUNTER — Telehealth: Payer: Self-pay

## 2020-12-12 NOTE — Telephone Encounter (Signed)
See result note message 

## 2020-12-12 NOTE — Telephone Encounter (Signed)
LMTCB in regards to lab results.  

## 2020-12-12 NOTE — Telephone Encounter (Signed)
Patient returned office phone call for results 

## 2020-12-21 ENCOUNTER — Telehealth: Payer: Self-pay | Admitting: Internal Medicine

## 2020-12-21 NOTE — Telephone Encounter (Signed)
My chart message sent to pt regarding hematology follow up.

## 2020-12-26 ENCOUNTER — Other Ambulatory Visit: Payer: Self-pay

## 2020-12-26 ENCOUNTER — Ambulatory Visit
Admission: RE | Admit: 2020-12-26 | Discharge: 2020-12-26 | Disposition: A | Discharge status: Home / Self Care | Payer: BC Managed Care – PPO | Source: Ambulatory Visit | Attending: Adult Health | Admitting: Adult Health

## 2020-12-26 DIAGNOSIS — Z803 Family history of malignant neoplasm of breast: Secondary | ICD-10-CM | POA: Insufficient documentation

## 2020-12-26 DIAGNOSIS — N6323 Unspecified lump in the left breast, lower outer quadrant: Secondary | ICD-10-CM | POA: Diagnosis present

## 2020-12-26 DIAGNOSIS — D72819 Decreased white blood cell count, unspecified: Secondary | ICD-10-CM | POA: Diagnosis present

## 2020-12-26 NOTE — Progress Notes (Signed)
IMPRESSION: There are benign clusters of cysts at the site of palpable concern. No mammographic or sonographic evidence of malignancy. Any further workup of the patient's symptoms should be based on the clinical assessment. Recommend routine annual screening mammogram, due April 2022.  RECOMMENDATION: Annual screening mammography, due April 2022. Evaluation for candidacy for high risk screening protocol is recommended.  Patient has a strong family history of breast cancer. The American Cancer Society recommends annual MRI and mammography in patients with an estimated lifetime risk of developing breast cancer greater than 20 - 25%, or who are known or suspected to be positive for the breast cancer gene.  I have discussed the findings and recommendations with the patient. If applicable, a reminder letter will be sent to the patient regarding the next appointment.  BI-RADS CATEGORY  2: Benign.   Electronically Signed   By: Meda Klinefelter M.D.   On: 12/26/2020 15:08

## 2021-01-22 ENCOUNTER — Encounter: Payer: Self-pay | Admitting: Internal Medicine

## 2021-01-24 ENCOUNTER — Telehealth (INDEPENDENT_AMBULATORY_CARE_PROVIDER_SITE_OTHER): Payer: BC Managed Care – PPO | Admitting: Internal Medicine

## 2021-01-24 VITALS — Temp 98.9°F | Ht 67.0 in | Wt 150.0 lb

## 2021-01-24 DIAGNOSIS — Z86010 Personal history of colonic polyps: Secondary | ICD-10-CM

## 2021-01-24 DIAGNOSIS — D649 Anemia, unspecified: Secondary | ICD-10-CM

## 2021-01-24 DIAGNOSIS — Z13220 Encounter for screening for lipoid disorders: Secondary | ICD-10-CM

## 2021-01-24 DIAGNOSIS — F439 Reaction to severe stress, unspecified: Secondary | ICD-10-CM

## 2021-01-24 DIAGNOSIS — D72819 Decreased white blood cell count, unspecified: Secondary | ICD-10-CM

## 2021-01-24 DIAGNOSIS — N926 Irregular menstruation, unspecified: Secondary | ICD-10-CM

## 2021-01-24 DIAGNOSIS — U071 COVID-19: Secondary | ICD-10-CM

## 2021-01-24 NOTE — Progress Notes (Addendum)
Patient ID: Adrienne Phillips, female   DOB: [birth year], 51 y.o.   MRN: [id number]   Virtual Visit via video Note  This visit type was conducted due to national recommendations for restrictions regarding the COVID-19 pandemic (e.g. social distancing).  This format is felt to be most appropriate for this patient at this time.  All issues noted in this document were discussed and addressed.  No physical exam was performed (except for noted visual exam findings with Video Visits).   I connected with [employer] by a video enabled telemedicine application and verified that I am speaking with the correct person using two identifiers. Location patient: home Location provider: work Persons participating in the virtual visit: patient, provider  The limitations, risks, security and privacy concerns of performing an evaluation and management service by video and the availability of in person appointments.have been discussed.  It has also been discussed with the patient that there may be a patient responsible charge related to this service. The patient expressed understanding and agreed to proceed.   Reason for visit: follow up appt  HPI: Was scheduled for regular f/u.  Tested positive for covid.  Husband covid.  States her symptoms started Sunday 01/20/21.  Initially noticed headache and sinus pressure.  Has been working out this week.  Noticed yesterday evening - felt sick.  Did not sleep well last night.  Tmax 99.2.  taking ibuprofen.  Minimal congestion.  Previous sore throat.  Minimal conestion - chest congestion.  No chest pain or sob.  No chest tightness.  She also reports having issues with her period.  Starting 11/24/20 through the whole month of November - three out of the four weeks had heavy bleeding.  Stopped for approximately 10 days and has since continued intermittent spotting.     ROS: See pertinent positives and negatives per HPI.  Past Medical History:  Diagnosis Date   Anemia     Hypoglycemia    Iron deficiency    Migraine    Ovarian cyst     Past Surgical History:  Procedure Laterality Date   BREAST CYST ASPIRATION Left 1998   BREAST CYST ASPIRATION Left 09/06/2020   1:00 FNA    Family History  Problem Relation Age of Onset   Hypertension Mother        migraines   Cancer Father        colon and a hx of skin   Breast cancer Sister 42   Rheum arthritis Brother    Osteoporosis Paternal Grandmother    Ulcers Paternal Grandfather    Breast cancer Maternal Aunt 59   Breast cancer Paternal Aunt     SOCIAL HX: reviewed.    Current Outpatient Medications:    ALPRAZolam (XANAX) 0.25 MG tablet, Take 1 tablet (0.25 mg total) by mouth daily as needed for sleep., Disp: 15 tablet, Rfl: 0   cyanocobalamin 2000 MCG tablet, Take 2,000 mcg by mouth daily., Disp: , Rfl:    Ferrous Sulfate (IRON PO), Take by mouth., Disp: , Rfl:    meloxicam (MOBIC) 15 MG tablet, Take 1 tablet (15 mg total) by mouth daily as needed for pain., Disp: 30 tablet, Rfl: 0   metaxalone (SKELAXIN) 800 MG tablet, TAKE 1/2 TO 1 TABLET BY MOUTH AT BEDTIME AS NEEDED FOR BACK SPASMS, Disp: 30 tablet, Rfl: 0   metroNIDAZOLE (METROGEL VAGINAL) 0.75 % vaginal gel, Place 1 Applicatorful vaginally 2 (two) times daily., Disp: 70 g, Rfl: 0   Multiple Vitamin (MULTIVITAMIN) capsule, Take  1 capsule by mouth daily., Disp: , Rfl:    mupirocin ointment (BACTROBAN) 2 %, Apply 1 application topically 2 (two) times daily. Left middle finger as needed, Disp: 30 g, Rfl: 0   Vitamin D, Ergocalciferol, (DRISDOL) 50000 units CAPS capsule, Take 1 capsule (50,000 Units total) by mouth every 7 (seven) days., Disp: 8 capsule, Rfl: 0  EXAM:  GENERAL: alert, oriented, appears well and in no acute distress  HEENT: atraumatic, conjunttiva clear, no obvious abnormalities on inspection of external nose and ears  NECK: normal movements of the head and neck  LUNGS: on inspection no signs of respiratory distress, breathing  rate appears normal, no obvious gross SOB, gasping or wheezing  CV: no obvious cyanosis  PSYCH/NEURO: pleasant and cooperative, no obvious depression or anxiety, speech and thought processing grossly intact  ASSESSMENT AND PLAN:  Discussed the following assessment and plan:  Problem List Items Addressed This Visit     Anemia    Follow cbc and iron studies.       Relevant Orders   Basic metabolic panel   CBC with Differential/Platelet   TSH   IBC + Ferritin   COVID-19 virus infection    Symptoms started 01/20/21.  Discussed saline nasal spray and steroid nasal spray as directed.  Robitussin DM.  She is 51 and healthy.  No chronic medical problems.  Day 5.  Hold antiviral medication.  Rest.  Fluids.  Call with update.  Follow closely.        History of colonic polyps    Colonoscopy 2018.      Irregular periods    Period change as outlined.  Increased bleeding throughout the month of November.  Still with intermittent spotting. Discussed further evaluation, including pelvic ultrasound, etc.  Refer to gyn.          Relevant Orders   Ambulatory referral to Gynecology   Leukopenia - Primary    Stable.  Follow cbc.       Relevant Orders   CBC with Differential/Platelet   Stress    Overall appears to be handling things well.  Follow.       Other Visit Diagnoses     Screening cholesterol level       Relevant Orders   Hepatic function panel   Lipid panel       Return in about 3 months (around 04/24/2021) for physical.   I discussed the assessment and treatment plan with the patient. The patient was provided an opportunity to ask questions and all were answered. The patient agreed with the plan and demonstrated an understanding of the instructions.   The patient was advised to call back or seek an in-person evaluation if the symptoms worsen or if the condition fails to improve as anticipated.   Dale Sims, MD

## 2021-01-25 ENCOUNTER — Telehealth: Payer: Self-pay | Admitting: Internal Medicine

## 2021-01-25 ENCOUNTER — Encounter: Payer: Self-pay | Admitting: Internal Medicine

## 2021-01-25 DIAGNOSIS — U071 COVID-19: Secondary | ICD-10-CM | POA: Insufficient documentation

## 2021-01-25 NOTE — Assessment & Plan Note (Signed)
Stable.  Follow cbc.  

## 2021-01-25 NOTE — Assessment & Plan Note (Signed)
Colonoscopy 2018.  

## 2021-01-25 NOTE — Assessment & Plan Note (Signed)
Follow cbc and iron studies.  

## 2021-01-25 NOTE — Telephone Encounter (Signed)
My chart message sent to pt.

## 2021-01-25 NOTE — Assessment & Plan Note (Signed)
Overall appears to be handling things well.  Follow.  ?

## 2021-01-25 NOTE — Addendum Note (Signed)
Addended by: Charm Barges on: 01/25/2021 04:05 AM   Modules accepted: Orders

## 2021-01-25 NOTE — Assessment & Plan Note (Signed)
Period change as outlined.  Increased bleeding throughout the month of November.  Still with intermittent spotting. Discussed further evaluation, including pelvic ultrasound, etc.  Refer to gyn.

## 2021-01-25 NOTE — Assessment & Plan Note (Signed)
Symptoms started 01/20/21.  Discussed saline nasal spray and steroid nasal spray as directed.  Robitussin DM.  She is 50 and healthy.  No chronic medical problems.  Day 5.  Hold antiviral medication.  Rest.  Fluids.  Call with update.  Follow closely.

## 2021-01-25 NOTE — Telephone Encounter (Signed)
Please schedule a physical in 3 months.  Also schedule fasting lab appt 1-2 days before physical.  Thanks.

## 2021-01-28 NOTE — Telephone Encounter (Signed)
Yes.  I will add her tomorrow at 4:00 - virtual visit.

## 2021-01-28 NOTE — Telephone Encounter (Signed)
Patient scheduled.

## 2021-01-28 NOTE — Telephone Encounter (Signed)
Called patient for update. Feeling better. Fever subsided. But still having Head congestion, chest congestion, facial pressure. Mucus green/yellow. Wondering if she needs abx. Confirmed otherwise doing ok. Still using robitussin DM and flonase but not resolving. Can we add her on tomorrow for a VV to discuss?

## 2021-01-29 ENCOUNTER — Telehealth (INDEPENDENT_AMBULATORY_CARE_PROVIDER_SITE_OTHER): Payer: BC Managed Care – PPO | Admitting: Internal Medicine

## 2021-01-29 DIAGNOSIS — R053 Chronic cough: Secondary | ICD-10-CM | POA: Diagnosis not present

## 2021-01-29 DIAGNOSIS — U071 COVID-19: Secondary | ICD-10-CM

## 2021-01-29 DIAGNOSIS — D72819 Decreased white blood cell count, unspecified: Secondary | ICD-10-CM

## 2021-01-29 DIAGNOSIS — N926 Irregular menstruation, unspecified: Secondary | ICD-10-CM

## 2021-01-29 MED ORDER — ALBUTEROL SULFATE HFA 108 (90 BASE) MCG/ACT IN AERS: 2.0000 | INHALATION_SPRAY | Freq: Four times a day (QID) | RESPIRATORY_TRACT | 0 refills | Status: DC | PRN

## 2021-01-29 MED ORDER — PREDNISONE 10 MG PO TABS: ORAL_TABLET | ORAL | 0 refills | Status: DC

## 2021-01-29 NOTE — Progress Notes (Signed)
Patient ID: Adrienne Phillips, female   DOB: [birth year], 51 y.o.   MRN: [id number]   Virtual Visit via video Note  This visit type was conducted due to national recommendations for restrictions regarding the COVID-19 pandemic (e.g. social distancing).  This format is felt to be most appropriate for this patient at this time.  All issues noted in this document were discussed and addressed.  No physical exam was performed (except for noted visual exam findings with Video Visits).   I connected with [employer] by a video enabled telemedicine application and verified that I am speaking with the correct person using two identifiers. Location patient: home Location provider: work  Persons participating in the virtual visit: patient, provider  The limitations, risks, security and privacy concerns of performing an evaluation and management service by video and the availability of in person appointments have been discussed.  It has also been discussed with the patient that there may be a patient responsible charge related to this service. The patient expressed understanding and agreed to proceed.   Reason for visit: work in appt  HPI: Work in f/u covid.  Symptoms started 01/20/21.  Had visit 01/24/21.  She has been using saline nasal spray and steroid nasal spray.  Robitussin DM as directed.  States she is feeling some better.  Does report right side sinus pressure.  Some light headedness when stands up.  Increased drainage.  Nose stopped up.  Persistent increased cough.  Not working out.  No chest pain.  Does describe a burning sensation when coughing.  No chest tightness.  Has noticed getting a little more winded, but no significant sob.  No vomiting or diarrhea reported.     ROS: See pertinent positives and negatives per HPI.  Past Medical History:  Diagnosis Date   Anemia    Hypoglycemia    Iron deficiency    Migraine    Ovarian cyst     Past Surgical History:  Procedure Laterality Date    BREAST CYST ASPIRATION Left 1998   BREAST CYST ASPIRATION Left 09/06/2020   1:00 FNA    Family History  Problem Relation Age of Onset   Hypertension Mother        migraines   Cancer Father        colon and a hx of skin   Breast cancer Sister 105   Rheum arthritis Brother    Osteoporosis Paternal Grandmother    Ulcers Paternal Grandfather    Breast cancer Maternal Aunt 15   Breast cancer Paternal Aunt     SOCIAL HX: reviewed.    Current Outpatient Medications:    albuterol (VENTOLIN HFA) 108 (90 Base) MCG/ACT inhaler, Inhale 2 puffs into the lungs every 6 (six) hours as needed for wheezing or shortness of breath., Disp: 18 g, Rfl: 0   ALPRAZolam (XANAX) 0.25 MG tablet, Take 1 tablet (0.25 mg total) by mouth daily as needed for sleep., Disp: 15 tablet, Rfl: 0   cyanocobalamin 2000 MCG tablet, Take 2,000 mcg by mouth daily., Disp: , Rfl:    Ferrous Sulfate (IRON PO), Take by mouth., Disp: , Rfl:    meloxicam (MOBIC) 15 MG tablet, Take 1 tablet (15 mg total) by mouth daily as needed for pain., Disp: 30 tablet, Rfl: 0   metaxalone (SKELAXIN) 800 MG tablet, TAKE 1/2 TO 1 TABLET BY MOUTH AT BEDTIME AS NEEDED FOR BACK SPASMS, Disp: 30 tablet, Rfl: 0   metroNIDAZOLE (METROGEL VAGINAL) 0.75 % vaginal gel, Place 1 Applicatorful  vaginally 2 (two) times daily., Disp: 70 g, Rfl: 0   Multiple Vitamin (MULTIVITAMIN) capsule, Take 1 capsule by mouth daily., Disp: , Rfl:    mupirocin ointment (BACTROBAN) 2 %, Apply 1 application topically 2 (two) times daily. Left middle finger as needed, Disp: 30 g, Rfl: 0   predniSONE (DELTASONE) 10 MG tablet, Take 4 tablets x 1 day and then decrease by 1/2 tablet per day until down to zero mg., Disp: 18 tablet, Rfl: 0   Vitamin D, Ergocalciferol, (DRISDOL) 50000 units CAPS capsule, Take 1 capsule (50,000 Units total) by mouth every 7 (seven) days., Disp: 8 capsule, Rfl: 0  EXAM:  GENERAL: alert, oriented, appears well and in no acute distress  HEENT:  atraumatic, conjunttiva clear, no obvious abnormalities on inspection of external nose and ears  NECK: normal movements of the head and neck  LUNGS: on inspection no signs of respiratory distress, breathing rate appears normal, no obvious gross SOB, gasping or wheezing  CV: no obvious cyanosis  PSYCH/NEURO: pleasant and cooperative, no obvious depression or anxiety, speech and thought processing grossly intact  ASSESSMENT AND PLAN:  Discussed the following assessment and plan:  Problem List Items Addressed This Visit     COVID-19 virus infection    Recently diagnosed with covid.  Persistent cough and symptoms as outlined.  Continue nasal spray and robitussin DM.  Prednisone taper as directed.  Albuterol inhaler if needed.  Check cxr.  Follow.  Call with update.        Irregular periods    See last note.  Referred to gyn.        Leukopenia    Has been stable.  Will follow.       Persistent cough    Recently diagnosed with covid.  Persistent cough and symptoms as outlined.  Continue nasal spray and robitussin DM.  Prednisone taper as directed.  Albuterol inhaler if needed.  Check cxr.  Follow.  Call with update.        Relevant Orders   DG Chest 2 View (Completed)    Return if symptoms worsen or fail to improve.   I discussed the assessment and treatment plan with the patient. The patient was provided an opportunity to ask questions and all were answered. The patient agreed with the plan and demonstrated an understanding of the instructions.   The patient was advised to call back or seek an in-person evaluation if the symptoms worsen or if the condition fails to improve as anticipated.   Dale , MD

## 2021-01-30 ENCOUNTER — Ambulatory Visit
Admission: RE | Admit: 2021-01-30 | Discharge: 2021-01-30 | Disposition: A | Discharge status: Home / Self Care | Payer: BC Managed Care – PPO | Source: Ambulatory Visit | Attending: Internal Medicine | Admitting: Internal Medicine

## 2021-01-30 ENCOUNTER — Ambulatory Visit
Admission: RE | Admit: 2021-01-30 | Discharge: 2021-01-30 | Disposition: A | Discharge status: Home / Self Care | Payer: BC Managed Care – PPO | Attending: Internal Medicine | Admitting: Internal Medicine

## 2021-01-30 DIAGNOSIS — R053 Chronic cough: Secondary | ICD-10-CM | POA: Diagnosis not present

## 2021-02-03 ENCOUNTER — Encounter: Payer: Self-pay | Admitting: Internal Medicine

## 2021-02-03 NOTE — Assessment & Plan Note (Signed)
Recently diagnosed with covid.  Persistent cough and symptoms as outlined.  Continue nasal spray and robitussin DM.  Prednisone taper as directed.  Albuterol inhaler if needed.  Check cxr.  Follow.  Call with update.   °

## 2021-02-03 NOTE — Assessment & Plan Note (Signed)
See last note.  Referred to gyn.

## 2021-02-03 NOTE — Assessment & Plan Note (Signed)
Recently diagnosed with covid.  Persistent cough and symptoms as outlined.  Continue nasal spray and robitussin DM.  Prednisone taper as directed.  Albuterol inhaler if needed.  Check cxr.  Follow.  Call with update.

## 2021-02-03 NOTE — Assessment & Plan Note (Signed)
Has been stable.  Will follow.

## 2021-02-11 NOTE — Progress Notes (Signed)
Adrienne Pheasant, MD   Chief Complaint  Patient presents with   Menstrual Problem    Pt has been bleeding since 11/24/20, abnormal pain and heavy flow back in Nov    HPI:      [name] is a 51 y.o. No obstetric history on file. whose LMP was Patient's last menstrual period was 11/24/2020 (exact date)., presents today for NP eval of AUB, referred by PCP. Menses were monthly, lasting 5 day, mod flow, no BTB, no dysmen until 11/21 when she then had a long and heavier period, lasting about 2 wks, with some increased cramping. Sx started after covid shot and thought it was related. Then no menses again till 4/22, lasted 7 days, normal mod flow for pt. No menses again till 11/22, and again with was heavy for 2 1/2 wks with increased cramping, lasted about a month, then stopped. Pt then developed light spotting 01/20/21 that has continued to present. 01/20/21 bleeding started after testing positive for Covid. No pain/cramping currently. Has had increased vasomotor sx past 2 wks, but otherwise they're intermittent. Would prefer not to do HRT after menopause. No hx of HTN, DVTs, tob use. 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  She is sex active, no pain/bleeding. No new partners.   Neg pap/neg HPV DNA 03/11/18. Hx of benign LT breast cysts on mammo/u/s. Pt feels they flare up when she has AUB.  Strong FH breast cancer. No cancer genetic testing done. Her affected sister declined it.   Patient Active Problem List   Diagnosis Date Noted   Family history of breast cancer 02/12/2021   Persistent cough 01/29/2021   COVID-19 virus infection 01/25/2021   Family history of aneurysm 10/28/2020   Vaginal discharge 02/20/2020   Left sided abdominal pain 02/15/2020   Weight gain 02/15/2020   Vaccine counseling 09/18/2019   Hearing loss 09/18/2019   Headache 07/07/2019   Breast tenderness 07/03/2019   Irregular periods 09/13/2018   Facial droop 04/10/2017   Numbness and  tingling of right arm and leg 04/10/2017   Vaginal lesion 12/21/2016   Sinusitis 10/10/2016   Stress 07/18/2016   Abnormal Pap smear of cervix 01/06/2016   Chest pain 06/30/2015   Left arm numbness 06/27/2015   Neck pain 06/27/2015   Bad odor of urine 11/13/2014   Back pain 11/08/2014   History of colonic polyps 03/19/2014   Health care maintenance 03/19/2014   Congestion of nasal sinus 03/19/2014   Barrett's esophagus 03/20/2013   Anemia 02/15/2012   Leukopenia 02/15/2012    Past Surgical History:  Procedure Laterality Date   BREAST CYST ASPIRATION Left 1998   BREAST CYST ASPIRATION Left 09/06/2020   1:00 FNA    Family History  Problem Relation Age of Onset   Hypertension Mother        migraines   Colon cancer Father        32s   Skin cancer Father    Breast cancer Sister 88   Rheum arthritis Brother    Osteoporosis Paternal Grandmother    Ulcers Paternal Grandfather    Breast cancer Maternal Aunt 58   Breast cancer Paternal Aunt        37s    Social History   Socioeconomic History   Marital status: Married    Spouse name: Not on file   Number of children: 0   Years of education: Not on file   Highest education level: Not on file  Occupational History  Employer: [employer]  Tobacco Use   Smoking status: Never   Smokeless tobacco: Never  Vaping Use   Vaping Use: Never used  Substance and Sexual Activity   Alcohol use: Yes    Alcohol/week: 0.0 standard drinks   Drug use: No   Sexual activity: Yes  Other Topics Concern   Not on file  Social History Narrative   Not on file   Social Determinants of Health   Financial Resource Strain: Not on file  Food Insecurity: Not on file  Transportation Needs: Not on file  Physical Activity: Not on file  Stress: Not on file  Social Connections: Not on file  Intimate Partner Violence: Not on file    Outpatient Medications Prior to Visit  Medication Sig Dispense Refill   ALPRAZolam (XANAX) 0.25 MG tablet Take 1  tablet (0.25 mg total) by mouth daily as needed for sleep. 15 tablet 0   ASHWAGANDHA PO      cyanocobalamin 2000 MCG tablet Take 2,000 mcg by mouth daily.     Ferrous Sulfate (IRON PO) Take by mouth.     metaxalone (SKELAXIN) 800 MG tablet TAKE 1/2 TO 1 TABLET BY MOUTH AT BEDTIME AS NEEDED FOR BACK SPASMS 30 tablet 0   Multiple Vitamin (MULTIVITAMIN) capsule Take 1 capsule by mouth daily.     Vitamin D, Ergocalciferol, (DRISDOL) 50000 units CAPS capsule Take 1 capsule (50,000 Units total) by mouth every 7 (seven) days. 8 capsule 0   albuterol (VENTOLIN HFA) 108 (90 Base) MCG/ACT inhaler Inhale 2 puffs into the lungs every 6 (six) hours as needed for wheezing or shortness of breath. 18 g 0   meloxicam (MOBIC) 15 MG tablet Take 1 tablet (15 mg total) by mouth daily as needed for pain. 30 tablet 0   metroNIDAZOLE (METROGEL VAGINAL) 0.75 % vaginal gel Place 1 Applicatorful vaginally 2 (two) times daily. 70 g 0   mupirocin ointment (BACTROBAN) 2 % Apply 1 application topically 2 (two) times daily. Left middle finger as needed 30 g 0   predniSONE (DELTASONE) 10 MG tablet Take 4 tablets x 1 day and then decrease by 1/2 tablet per day until down to zero mg. 18 tablet 0   No facility-administered medications prior to visit.      ROS:  Review of Systems  Constitutional:  Negative for fever.  Gastrointestinal:  Negative for blood in stool, constipation, diarrhea, nausea and vomiting.  Genitourinary:  Positive for menstrual problem. Negative for dyspareunia, dysuria, flank pain, frequency, hematuria, urgency, vaginal bleeding, vaginal discharge and vaginal pain.  Musculoskeletal:  Negative for back pain.  Skin:  Negative for rash.  BREAST: No symptoms   OBJECTIVE:   Vitals:  BP 120/62    Ht '5\' 7"'  (1.702 m)    Wt 146 lb (66.2 kg)    LMP 11/24/2020 (Exact Date)    BMI 22.87 kg/m   Physical Exam Vitals reviewed.  Constitutional:      Appearance: She is well-developed.  Pulmonary:     Effort:  Pulmonary effort is normal.  Genitourinary:    General: Normal vulva.     Pubic Area: No rash.      Labia:        Right: No rash, tenderness or lesion.        Left: No rash, tenderness or lesion.      Vagina: Normal. No vaginal discharge, erythema, tenderness or bleeding.     Cervix: Normal.     Uterus: Normal. Not enlarged and not tender.  Adnexa: Right adnexa normal and left adnexa normal.       Right: No mass or tenderness.         Left: No mass or tenderness.    Musculoskeletal:        General: Normal range of motion.     Cervical back: Normal range of motion.  Skin:    General: Skin is warm and dry.  Neurological:     General: No focal deficit present.     Mental Status: She is alert and oriented to person, place, and time.  Psychiatric:        Mood and Affect: Mood normal.        Behavior: Behavior normal.        Thought Content: Thought content normal.        Judgment: Judgment normal.    Assessment/Plan: Abnormal uterine bleeding (AUB) - Plan: TSH, US PELVIS TRANSVAGINAL NON-OB (TV ONLY), Cytology - PAP; Sx intermittent since 11/22; check pap/labs/GYN u/s. Will f/u with results. If neg, most likely due to perimenopause and can discuss tx options. Pt not interested in HRT but discussed benefits of short courses for AUB.   Thyroid disorder screening - Plan: TSH  Cervical cancer screening - Plan: Cytology - PAP  Screening for HPV (human papillomavirus) - Plan: Cytology - PAP  Family history of breast cancer - Plan: [facility] ([facility])--MyRisk testing discussed and pt to RTO with nurse for [facility] testing. Will f/u with results.     Return for 02/28/21 GYN u/s for AUB, ABC to call pt.  Rishawn Walck B. Emiliya Chretien, PA-C 02/12/2021 4:39 PM

## 2021-02-12 ENCOUNTER — Other Ambulatory Visit (HOSPITAL_COMMUNITY)
Admission: RE | Admit: 2021-02-12 | Discharge: 2021-02-12 | Disposition: A | Discharge status: Home / Self Care | Payer: BC Managed Care – PPO | Source: Ambulatory Visit | Attending: Obstetrics and Gynecology | Admitting: Obstetrics and Gynecology

## 2021-02-12 ENCOUNTER — Ambulatory Visit: Payer: BC Managed Care – PPO | Admitting: Obstetrics and Gynecology

## 2021-02-12 ENCOUNTER — Other Ambulatory Visit: Payer: Self-pay

## 2021-02-12 ENCOUNTER — Encounter: Payer: Self-pay | Admitting: Obstetrics and Gynecology

## 2021-02-12 VITALS — BP 120/62 | Ht 67.0 in | Wt 146.0 lb

## 2021-02-12 DIAGNOSIS — Z1329 Encounter for screening for other suspected endocrine disorder: Secondary | ICD-10-CM | POA: Diagnosis not present

## 2021-02-12 DIAGNOSIS — Z124 Encounter for screening for malignant neoplasm of cervix: Secondary | ICD-10-CM | POA: Insufficient documentation

## 2021-02-12 DIAGNOSIS — N939 Abnormal uterine and vaginal bleeding, unspecified: Secondary | ICD-10-CM | POA: Diagnosis present

## 2021-02-12 DIAGNOSIS — Z1151 Encounter for screening for human papillomavirus (HPV): Secondary | ICD-10-CM

## 2021-02-12 DIAGNOSIS — Z803 Family history of malignant neoplasm of breast: Secondary | ICD-10-CM

## 2021-02-13 LAB — TSH: TSH: 1.31 u[IU]/mL (ref 0.450–4.500)

## 2021-02-13 LAB — CYTOLOGY - PAP
Comment: NEGATIVE
Diagnosis: NEGATIVE
High risk HPV: NEGATIVE

## 2021-02-15 ENCOUNTER — Ambulatory Visit: Payer: BC Managed Care – PPO

## 2021-02-19 ENCOUNTER — Ambulatory Visit: Payer: BC Managed Care – PPO

## 2021-02-19 ENCOUNTER — Other Ambulatory Visit: Payer: Self-pay

## 2021-02-20 DIAGNOSIS — Z9189 Other specified personal risk factors, not elsewhere classified: Secondary | ICD-10-CM

## 2021-02-20 DIAGNOSIS — Z1371 Encounter for nonprocreative screening for genetic disease carrier status: Secondary | ICD-10-CM

## 2021-02-20 HISTORY — DX: Encounter for nonprocreative screening for genetic disease carrier status: Z13.71

## 2021-02-20 HISTORY — DX: Other specified personal risk factors, not elsewhere classified: Z91.89

## 2021-02-28 ENCOUNTER — Other Ambulatory Visit: Payer: Self-pay | Admitting: Obstetrics and Gynecology

## 2021-02-28 ENCOUNTER — Telehealth: Payer: Self-pay | Admitting: Obstetrics and Gynecology

## 2021-02-28 ENCOUNTER — Other Ambulatory Visit: Payer: Self-pay

## 2021-02-28 ENCOUNTER — Ambulatory Visit (INDEPENDENT_AMBULATORY_CARE_PROVIDER_SITE_OTHER): Payer: BC Managed Care – PPO

## 2021-02-28 DIAGNOSIS — N939 Abnormal uterine and vaginal bleeding, unspecified: Secondary | ICD-10-CM

## 2021-02-28 NOTE — Progress Notes (Signed)
That could be of some help

## 2021-02-28 NOTE — Telephone Encounter (Signed)
Pt aware of GYN u/s results. Pt with irregular menses with menorrhagia/clots/AUB. EM=20.1  mm. Discussed mgmt options of EMB with hormones/IUD vs hyst/D&C due to potential for polyp. Pt hesitant about hormones, had D&C in past. Will consider options and f/u with decision.

## 2021-02-28 NOTE — Progress Notes (Signed)
Pt with irregular menses due to age and then AUB/menorrhagia? Question polyp? Should she do hyst/D&C?

## 2021-03-11 ENCOUNTER — Encounter: Payer: Self-pay | Admitting: Obstetrics and Gynecology

## 2021-03-14 ENCOUNTER — Encounter: Payer: Self-pay | Admitting: Obstetrics and Gynecology

## 2021-03-20 NOTE — Telephone Encounter (Signed)
Pt aware of neg MyRisk results except AXIN2 VUS. IBIS=35.4%/riskscore=26.4%.  ?Pt aware of recommendations of monthly SBE, 6 and 12 mos CBE and yearly mammos and scr breast MRI. Pt had neg mammo 12/22. Call for breast MRI ref by 4/23 if interested. Also discussed prophylactic mastectomy and chemo prevention prn.  ? ?Patient understands these results only apply to her and her children, and this is not indicative of genetic testing results of her other family members. It is recommended that her other family members have genetic testing done. ? ?Pt also understands negative genetic testing doesn't mean she will never get any of these cancers.  ? ?Hard copy mailed to pt. F/u prn.  ? ?

## 2021-05-03 ENCOUNTER — Other Ambulatory Visit: Payer: BC Managed Care – PPO

## 2021-05-07 ENCOUNTER — Ambulatory Visit (INDEPENDENT_AMBULATORY_CARE_PROVIDER_SITE_OTHER): Payer: BC Managed Care – PPO | Admitting: Internal Medicine

## 2021-05-07 ENCOUNTER — Encounter: Payer: Self-pay | Admitting: Internal Medicine

## 2021-05-07 VITALS — BP 124/70 | HR 67 | Temp 98.0°F | Resp 16 | Ht 67.0 in | Wt 145.4 lb

## 2021-05-07 DIAGNOSIS — D72819 Decreased white blood cell count, unspecified: Secondary | ICD-10-CM

## 2021-05-07 DIAGNOSIS — E875 Hyperkalemia: Secondary | ICD-10-CM

## 2021-05-07 DIAGNOSIS — N6323 Unspecified lump in the left breast, lower outer quadrant: Secondary | ICD-10-CM | POA: Diagnosis not present

## 2021-05-07 DIAGNOSIS — Z0000 Encounter for general adult medical examination without abnormal findings: Secondary | ICD-10-CM

## 2021-05-07 DIAGNOSIS — D649 Anemia, unspecified: Secondary | ICD-10-CM

## 2021-05-07 DIAGNOSIS — Z860100 Personal history of colon polyps, unspecified: Secondary | ICD-10-CM

## 2021-05-07 DIAGNOSIS — Z803 Family history of malignant neoplasm of breast: Secondary | ICD-10-CM | POA: Diagnosis not present

## 2021-05-07 DIAGNOSIS — Z13220 Encounter for screening for lipoid disorders: Secondary | ICD-10-CM

## 2021-05-07 DIAGNOSIS — R109 Unspecified abdominal pain: Secondary | ICD-10-CM

## 2021-05-07 DIAGNOSIS — R1310 Dysphagia, unspecified: Secondary | ICD-10-CM

## 2021-05-07 DIAGNOSIS — N926 Irregular menstruation, unspecified: Secondary | ICD-10-CM

## 2021-05-07 LAB — CBC WITH DIFFERENTIAL/PLATELET
Basophils Absolute: 0.1 10*3/uL (ref 0.0–0.1)
Basophils Relative: 2.8 % (ref 0.0–3.0)
Eosinophils Absolute: 0.1 10*3/uL (ref 0.0–0.7)
Eosinophils Relative: 2.2 % (ref 0.0–5.0)
HCT: 36.3 % (ref 36.0–46.0)
Hemoglobin: 12.1 g/dL (ref 12.0–15.0)
Lymphocytes Relative: 25.9 % (ref 12.0–46.0)
Lymphs Abs: 0.7 10*3/uL (ref 0.7–4.0)
MCHC: 33.3 g/dL (ref 30.0–36.0)
MCV: 89.5 fl (ref 78.0–100.0)
Monocytes Absolute: 0.3 10*3/uL (ref 0.1–1.0)
Monocytes Relative: 11.4 % (ref 3.0–12.0)
Neutro Abs: 1.6 10*3/uL (ref 1.4–7.7)
Neutrophils Relative %: 57.7 % (ref 43.0–77.0)
Platelets: 221 10*3/uL (ref 150.0–400.0)
RBC: 4.05 Mil/uL (ref 3.87–5.11)
RDW: 14.6 % (ref 11.5–15.5)
WBC: 2.7 10*3/uL — ABNORMAL LOW (ref 4.0–10.5)

## 2021-05-07 LAB — BASIC METABOLIC PANEL
BUN: 7 mg/dL (ref 6–23)
CO2: 31 mEq/L (ref 19–32)
Calcium: 9.1 mg/dL (ref 8.4–10.5)
Chloride: 101 mEq/L (ref 96–112)
Creatinine, Ser: 0.63 mg/dL (ref 0.40–1.20)
GFR: 102.55 mL/min (ref >60.00)
Glucose, Bld: 87 mg/dL (ref 70–99)
Potassium: 4.8 mEq/L (ref 3.5–5.1)
Sodium: 137 mEq/L (ref 135–145)

## 2021-05-07 LAB — IBC + FERRITIN
Ferritin: 13.9 ng/mL (ref 10.0–291.0)
Iron: 85 ug/dL (ref 42–145)
Saturation Ratios: 19.6 % — ABNORMAL LOW (ref 20.0–50.0)
TIBC: 434 ug/dL (ref 250.0–450.0)
Transferrin: 310 mg/dL (ref 212.0–360.0)

## 2021-05-07 LAB — LIPID PANEL
Cholesterol: 201 mg/dL — ABNORMAL HIGH (ref 0–200)
HDL: 116.9 mg/dL (ref >39.00)
LDL Cholesterol: 76 mg/dL (ref 0–99)
NonHDL: 84.27
Total CHOL/HDL Ratio: 2
Triglycerides: 41 mg/dL (ref 0.0–149.0)
VLDL: 8.2 mg/dL (ref 0.0–40.0)

## 2021-05-07 LAB — HEPATIC FUNCTION PANEL
ALT: 29 U/L (ref 0–35)
AST: 26 U/L (ref 0–37)
Albumin: 4.5 g/dL (ref 3.5–5.2)
Alkaline Phosphatase: 75 U/L (ref 39–117)
Bilirubin, Direct: 0.1 mg/dL (ref 0.0–0.3)
Total Bilirubin: 0.5 mg/dL (ref 0.2–1.2)
Total Protein: 7.1 g/dL (ref 6.0–8.3)

## 2021-05-07 MED ORDER — METAXALONE 800 MG PO TABS
ORAL_TABLET | ORAL | 0 refills | Status: DC
Start: 2021-05-07 — End: 2023-05-18

## 2021-05-07 NOTE — Progress Notes (Signed)
Patient ID: Adrienne Phillips, female   DOB: 25-Sep-1970, 51 y.o.   MRN: [id number] ? ? ?Subjective:  ? ? Patient ID: Adrienne Phillips, female    DOB: 04/26/1970, 51 y.o.   MRN: [id number] ? ?This visit occurred during the SARS-CoV-2 public health emergency.  Safety protocols were in place, including screening questions prior to the visit, additional usage of staff PPE, and extensive cleaning of exam room while observing appropriate contact time as indicated for disinfecting solutions.  ? ?Patient here for her physical exam.  ? ?Chief Complaint  ?Patient presents with  ? Annual Exam  ?  CPE  ? .  ? ?HPI ?Having problems with dysphagia.  Over the last two months - food getting stuck.  May occur 3-4x/week.  Some issues with liquids as well.  No increased acid reflux.  No vomiting.  Also has noticed some intermittent discomfort left upper quadrant.  Intermittent.  No trigger.  Some soft stool - beach.  No other abdominal pain.  No chest pain or sob.  No cough or congestion.  Discussed recent genetic testing.  Had questions regarding MRI and mammogram.  No period since 02/2021.  Some minimal hot flashes.  Some foot pain - left > right.  Exercising.   ? ? ?Past Medical History:  ?Diagnosis Date  ? Anemia   ? BRCA negative 02/2021  ? MyRisk neg except AXIN2 VUS  ? Family history of breast cancer   ? Hypoglycemia   ? Increased risk of breast cancer 02/2021  ? IBIS=35.4%/riskscore=26.4%  ? Iron deficiency   ? Migraine   ? Ovarian cyst   ? ?Past Surgical History:  ?Procedure Laterality Date  ? BREAST CYST ASPIRATION Left 1998  ? BREAST CYST ASPIRATION Left 09/06/2020  ? 1:00 FNA  ? ?Family History  ?Problem Relation Age of Onset  ? Hypertension Mother   ?     migraines  ? Colon cancer Father   ?     29s  ? Skin cancer Father   ? Breast cancer Sister 51  ? Rheum arthritis Brother   ? Osteoporosis Paternal Grandmother   ? Ulcers Paternal Grandfather   ? Breast cancer Maternal Aunt   ?     46  ? Breast cancer Paternal Aunt 45   ? ?Social History  ? ?Socioeconomic History  ? Marital status: Married  ?  Spouse name: Not on file  ? Number of children: 0  ? Years of education: Not on file  ? Highest education level: Not on file  ?Occupational History  ?  Employer: [employer]  ?Tobacco Use  ? Smoking status: Never  ? Smokeless tobacco: Never  ?Vaping Use  ? Vaping Use: Never used  ?Substance and Sexual Activity  ? Alcohol use: Yes  ?  Alcohol/week: 0.0 standard drinks  ? Drug use: No  ? Sexual activity: Yes  ?Other Topics Concern  ? Not on file  ?Social History Narrative  ? Not on file  ? ?Social Determinants of Health  ? ?Financial Resource Strain: Not on file  ?Food Insecurity: Not on file  ?Transportation Needs: Not on file  ?Physical Activity: Not on file  ?Stress: Not on file  ?Social Connections: Not on file  ? ? ? ?Review of Systems  ?Constitutional:  Negative for appetite change and unexpected weight change.  ?HENT:  Negative for congestion, sinus pressure and sore throat.   ?Eyes:  Negative for pain and visual disturbance.  ?Respiratory:  Negative for cough, chest tightness  and shortness of breath.   ?Cardiovascular:  Negative for chest pain, palpitations and leg swelling.  ?Gastrointestinal:  Negative for abdominal pain, diarrhea, nausea and vomiting.  ?Genitourinary:  Negative for difficulty urinating and dysuria.  ?Musculoskeletal:  Negative for joint swelling and myalgias.  ?Skin:  Negative for color change and rash.  ?Neurological:  Negative for dizziness, light-headedness and headaches.  ?Hematological:  Negative for adenopathy. Does not bruise/bleed easily.  ?Psychiatric/Behavioral:  Negative for agitation and dysphoric mood.   ? ?   ?Objective:  ?  ? ?BP 124/70 (BP Location: Left Arm, Patient Position: Sitting, Cuff Size: Small)   Pulse 67   Temp 98 ?F (36.7 ?C) (Temporal)   Resp 16   Ht _0  (1.702 m)   Wt 145 lb 6.4 oz (66 kg)   SpO2 99%   BMI 22.77 kg/m?  ?Wt Readings from Last 3 Encounters:  ?05/07/21 145 lb 6.4 oz (66  kg)  ?02/12/21 146 lb (66.2 kg)  ?01/29/21 140 lb (63.5 kg)  ? ? ?Physical Exam ?Vitals reviewed.  ?Constitutional:   ?   General: She is not in acute distress. ?   Appearance: Normal appearance. She is well-developed.  ?HENT:  ?   Head: Normocephalic and atraumatic.  ?   Right Ear: External ear normal.  ?   Left Ear: External ear normal.  ?Eyes:  ?   General: No scleral icterus.    ?   Right eye: No discharge.     ?   Left eye: No discharge.  ?   Conjunctiva/sclera: Conjunctivae normal.  ?Neck:  ?   Thyroid: No thyromegaly.  ?Cardiovascular:  ?   Rate and Rhythm: Normal rate and regular rhythm.  ?Pulmonary:  ?   Effort: No tachypnea, accessory muscle usage or respiratory distress.  ?   Breath sounds: Normal breath sounds. No decreased breath sounds, wheezing or rhonchi.  ?Chest:  ?Breasts: ?   Right: No inverted nipple, mass, nipple discharge or tenderness (no axillary adenopathy).  ?   Left: No inverted nipple, mass, nipple discharge or tenderness (no axilarry adenopathy).  ?Abdominal:  ?   General: Bowel sounds are normal.  ?   Palpations: Abdomen is soft.  ?   Tenderness: There is no abdominal tenderness.  ?Musculoskeletal:     ?   General: No swelling or tenderness.  ?   Cervical back: Neck supple.  ?Lymphadenopathy:  ?   Cervical: No cervical adenopathy.  ?Skin: ?   General: Skin is warm.  ?   Findings: No erythema or rash.  ?Neurological:  ?   Mental Status: She is alert and oriented to person, place, and time.  ?Psychiatric:     ?   Mood and Affect: Mood normal.     ?   Behavior: Behavior normal.  ? ? ? ?Outpatient Encounter Medications as of 05/07/2021  ?Medication Sig  ? ALPRAZolam (XANAX) 0.25 MG tablet Take 1 tablet (0.25 mg total) by mouth daily as needed for sleep.  ? ASHWAGANDHA PO   ? CHARCOAL ACTIVATED PO Take by mouth.  ? cyanocobalamin 2000 MCG tablet Take 2,000 mcg by mouth daily.  ? Ferrous Sulfate (IRON PO) Take by mouth.  ? MAGNESIUM GLUCONATE PO Take by mouth.  ? Multiple Vitamin  (MULTIVITAMIN) capsule Take 1 capsule by mouth daily.  ? Vitamin D, Ergocalciferol, (DRISDOL) 50000 units CAPS capsule Take 1 capsule (50,000 Units total) by mouth every 7 (seven) days.  ? [DISCONTINUED] metaxalone (SKELAXIN) 800 MG tablet TAKE 1/2  TO 1 TABLET BY MOUTH AT BEDTIME AS NEEDED FOR BACK SPASMS  ? metaxalone (SKELAXIN) 800 MG tablet TAKE 1/2 TO 1 TABLET BY MOUTH AT BEDTIME AS NEEDED FOR BACK SPASMS  ? ?No facility-administered encounter medications on file as of 05/07/2021.  ?  ? ?Lab Results  ?Component Value Date  ? WBC 2.7 (L) 05/07/2021  ? HGB 12.1 05/07/2021  ? HCT 36.3 05/07/2021  ? PLT 221.0 05/07/2021  ? GLUCOSE 87 05/07/2021  ? CHOL 201 (H) 05/07/2021  ? TRIG 41.0 05/07/2021  ? HDL 116.90 05/07/2021  ? [place] 76 05/07/2021  ? ALT 29 05/07/2021  ? AST 26 05/07/2021  ? NA 137 05/07/2021  ? K 4.8 05/07/2021  ? CL 101 05/07/2021  ? CREATININE 0.63 05/07/2021  ? BUN 7 05/07/2021  ? CO2 31 05/07/2021  ? TSH 1.310 02/12/2021  ? HGBA1C 5.4 11/02/2015  ? ? ?   ?Assessment & Plan:  ? ?Problem List Items Addressed This Visit   ? ? Anemia  ?  Follow cbc and iron studies.  ? ?  ?  ? Relevant Orders  ? CBC w/Diff (Completed)  ? IBC + Ferritin (Completed)  ? Dysphagia  ?  Dysphagia as outlined.  Discussed.  No acid reflux reported.  Trial pepcid.  Previous EGD 06/10/16 (Dr Tiffany Kocher) - minimal chronic gastritis and mild foveloar hyperplasia.  Negative H.pylori. (per note, no repeat EGD).  Discussed given dysphagia, referral back to GI for evaluation and question of need for f/u EGD.  Of note, previous mention of barretts.  Previous pathology - no barretts.  Needs f/u.  ? ?  ?  ? Relevant Orders  ? Ambulatory referral to Gastroenterology  ? Family history of breast cancer  ?  Had genetic testing as outlined.  Discuss with gyn regarding MRI/mammogram.   ? ?  ?  ? Health care maintenance  ?  Physical - 05/07/21.  PAP 02/2018 - negative with negative HPV.  Just saw gyn.  Mammogram 05/15/20 - Birads I.  Colonoscopy 2018.    ? ?  ?  ? History of colonic polyps  ?  Colonoscopy 2018. ? ?  ?  ? Irregular periods  ?  No period since 02/2021.  Seeing gyn. Note reviewed.  ? ?  ?  ? Left sided abdominal pain  ?  Left upper quadrant pain.  No specific tri

## 2021-05-11 ENCOUNTER — Encounter: Payer: Self-pay | Admitting: Internal Medicine

## 2021-05-11 DIAGNOSIS — R1310 Dysphagia, unspecified: Secondary | ICD-10-CM | POA: Insufficient documentation

## 2021-05-11 NOTE — Assessment & Plan Note (Signed)
Follow cbc and iron studies.  

## 2021-05-11 NOTE — Assessment & Plan Note (Signed)
Had genetic testing as outlined.  Discuss with gyn regarding MRI/mammogram.   ?

## 2021-05-11 NOTE — Assessment & Plan Note (Signed)
Left upper quadrant pain.  No specific triggers.  Obtain abdominal ultrasound.  ?

## 2021-05-11 NOTE — Assessment & Plan Note (Signed)
Has been stable.  Will follow.  ?

## 2021-05-11 NOTE — Assessment & Plan Note (Signed)
No period since 02/2021.  Seeing gyn. Note reviewed.  ?

## 2021-05-11 NOTE — Assessment & Plan Note (Signed)
Physical - 05/07/21.  PAP 02/2018 - negative with negative HPV.  Just saw gyn.  Mammogram 05/15/20 - Birads I.  Colonoscopy 2018.   ?

## 2021-05-11 NOTE — Assessment & Plan Note (Signed)
Colonoscopy 2018.  

## 2021-05-11 NOTE — Assessment & Plan Note (Signed)
Dysphagia as outlined.  Discussed.  No acid reflux reported.  Trial pepcid.  Previous EGD 06/10/16 (Dr Markham Jordan) - minimal chronic gastritis and mild foveloar hyperplasia.  Negative H.pylori. (per note, no repeat EGD).  Discussed given dysphagia, referral back to GI for evaluation and question of need for f/u EGD.  Of note, previous mention of barretts.  Previous pathology - no barretts.  Needs f/u.  ?

## 2021-05-29 ENCOUNTER — Ambulatory Visit: Admission: RE | Admit: 2021-05-29 | Payer: BC Managed Care – PPO | Source: Ambulatory Visit

## 2021-06-15 ENCOUNTER — Encounter: Payer: Self-pay | Admitting: Internal Medicine

## 2021-06-18 NOTE — Telephone Encounter (Signed)
Ok to schedule.  Understands my be scheduled out.

## 2021-07-03 ENCOUNTER — Encounter: Payer: Self-pay | Admitting: Internal Medicine

## 2021-07-03 DIAGNOSIS — Z87898 Personal history of other specified conditions: Secondary | ICD-10-CM | POA: Insufficient documentation

## 2021-08-05 ENCOUNTER — Ambulatory Visit
Admission: RE | Admit: 2021-08-05 | Discharge: 2021-08-05 | Disposition: A | Discharge status: Home / Self Care | Payer: BC Managed Care – PPO | Source: Ambulatory Visit | Attending: Internal Medicine | Admitting: Internal Medicine

## 2021-08-05 DIAGNOSIS — Z803 Family history of malignant neoplasm of breast: Secondary | ICD-10-CM | POA: Diagnosis not present

## 2021-08-05 DIAGNOSIS — N6323 Unspecified lump in the left breast, lower outer quadrant: Secondary | ICD-10-CM | POA: Diagnosis not present

## 2021-08-05 DIAGNOSIS — Z1231 Encounter for screening mammogram for malignant neoplasm of breast: Secondary | ICD-10-CM | POA: Insufficient documentation

## 2021-08-16 ENCOUNTER — Encounter: Payer: Self-pay | Admitting: Internal Medicine

## 2021-08-28 ENCOUNTER — Telehealth: Payer: BC Managed Care – PPO

## 2021-08-29 ENCOUNTER — Ambulatory Visit: Payer: BC Managed Care – PPO | Admitting: Internal Medicine

## 2021-08-29 ENCOUNTER — Encounter: Payer: Self-pay | Admitting: Internal Medicine

## 2021-08-29 VITALS — BP 122/74 | HR 92 | Temp 98.1°F | Resp 17 | Ht 67.0 in | Wt 142.2 lb

## 2021-08-29 DIAGNOSIS — R350 Frequency of micturition: Secondary | ICD-10-CM | POA: Diagnosis not present

## 2021-08-29 DIAGNOSIS — Z803 Family history of malignant neoplasm of breast: Secondary | ICD-10-CM | POA: Diagnosis not present

## 2021-08-29 DIAGNOSIS — M546 Pain in thoracic spine: Secondary | ICD-10-CM

## 2021-08-29 DIAGNOSIS — D649 Anemia, unspecified: Secondary | ICD-10-CM

## 2021-08-29 LAB — POCT URINALYSIS DIPSTICK
Bilirubin, UA: NEGATIVE
Blood, UA: NEGATIVE
Glucose, UA: NEGATIVE
Nitrite, UA: POSITIVE
Protein, UA: NEGATIVE
Spec Grav, UA: 1.01 (ref 1.010–1.025)
Urobilinogen, UA: 2 E.U./dL — AB
pH, UA: 7 (ref 5.0–8.0)

## 2021-08-29 MED ORDER — CEFDINIR 300 MG PO CAPS: 300.0000 mg | ORAL_CAPSULE | Freq: Two times a day (BID) | ORAL | 0 refills | Status: DC

## 2021-08-29 NOTE — Progress Notes (Signed)
Patient ID: Adrienne Phillips, female   DOB: [birth year], 51 y.o.   MRN: [id number]   Subjective:    Patient ID: Adrienne Phillips, female    DOB: 07-13-1970, 51 y.o.   MRN: [id number]   Patient here for work in appt.  Marland Kitchen   HPI Work in with concerns regarding back pain.  States symptoms started one week ago - mid back and radiated about to front.  No known injury or trauma.  No nausea or vomiting.  Feels a heaviness.  Hard to get comfortable.  Has noticed some increased urinary frequency. Some urinary urgency.  No hematuria.  No fever reported.  Stays active - exercises.  No rash.    Past Medical History:  Diagnosis Date   Anemia    BRCA negative 02/2021   MyRisk neg except AXIN2 VUS   Family history of breast cancer    Hypoglycemia    Increased risk of breast cancer 02/2021   IBIS=35.4%/riskscore=26.4%   Iron deficiency    Migraine    Ovarian cyst    Past Surgical History:  Procedure Laterality Date   BREAST CYST ASPIRATION Left 1998   BREAST CYST ASPIRATION Left 09/06/2020   1:00 FNA   Family History  Problem Relation Age of Onset   Hypertension Mother        migraines   Colon cancer Father        74s   Skin cancer Father    Breast cancer Sister 38   Rheum arthritis Brother    Osteoporosis Paternal Grandmother    Ulcers Paternal Grandfather    Breast cancer Maternal Aunt        10   Breast cancer Paternal Aunt 18   Social History   Socioeconomic History   Marital status: Married    Spouse name: Not on file   Number of children: 0   Years of education: Not on file   Highest education level: Not on file  Occupational History    Employer: [employer]  Tobacco Use   Smoking status: Never   Smokeless tobacco: Never  Vaping Use   Vaping Use: Never used  Substance and Sexual Activity   Alcohol use: Yes    Alcohol/week: 0.0 standard drinks of alcohol   Drug use: No   Sexual activity: Yes  Other Topics Concern   Not on file  Social History Narrative   Not on file    Social Determinants of Health   Financial Resource Strain: Not on file  Food Insecurity: Not on file  Transportation Needs: Not on file  Physical Activity: Not on file  Stress: Not on file  Social Connections: Not on file     Review of Systems  Constitutional:  Negative for appetite change and fever.  HENT:  Negative for congestion and sinus pressure.   Respiratory:  Negative for cough, chest tightness and shortness of breath.   Cardiovascular:  Negative for chest pain, palpitations and leg swelling.  Gastrointestinal:  Negative for diarrhea, nausea and vomiting.       Pain radiates around from back.   Genitourinary:  Positive for frequency and urgency.  Musculoskeletal:  Positive for back pain. Negative for myalgias.  Skin:  Negative for color change and rash.  Neurological:  Negative for dizziness and headaches.  Psychiatric/Behavioral:  Negative for agitation and dysphoric mood.        Objective:     BP 122/74 (BP Location: Left Arm, Patient Position: Sitting, Cuff Size: Small)   Pulse  92   Temp 98.1 F (36.7 C) (Temporal)   Resp 17   Ht 5' 7" (1.702 m)   Wt 142 lb 3.2 oz (64.5 kg)   SpO2 98%   BMI 22.27 kg/m  Wt Readings from Last 3 Encounters:  08/29/21 142 lb 3.2 oz (64.5 kg)  05/07/21 145 lb 6.4 oz (66 kg)  02/12/21 146 lb (66.2 kg)    Physical Exam Vitals reviewed.  Constitutional:      General: She is not in acute distress.    Appearance: Normal appearance.  HENT:     Head: Normocephalic and atraumatic.     Right Ear: External ear normal.     Left Ear: External ear normal.  Eyes:     General: No scleral icterus.       Right eye: No discharge.        Left eye: No discharge.     Conjunctiva/sclera: Conjunctivae normal.  Neck:     Thyroid: No thyromegaly.  Cardiovascular:     Rate and Rhythm: Normal rate and regular rhythm.  Pulmonary:     Effort: No respiratory distress.     Breath sounds: Normal breath sounds. No wheezing.  Abdominal:      General: Bowel sounds are normal.     Palpations: Abdomen is soft.     Tenderness: There is no abdominal tenderness.  Musculoskeletal:        General: No swelling or tenderness.     Cervical back: Neck supple. No tenderness.     Comments: No CVA tenderness.   Lymphadenopathy:     Cervical: No cervical adenopathy.  Skin:    Findings: No erythema or rash.  Neurological:     Mental Status: She is alert.  Psychiatric:        Mood and Affect: Mood normal.        Behavior: Behavior normal.      Outpatient Encounter Medications as of 08/29/2021  Medication Sig   ALPRAZolam (XANAX) 0.25 MG tablet Take 1 tablet (0.25 mg total) by mouth daily as needed for sleep.   ASHWAGANDHA PO    cefdinir (OMNICEF) 300 MG capsule Take 1 capsule (300 mg total) by mouth 2 (two) times daily.   CHARCOAL ACTIVATED PO Take by mouth.   cyanocobalamin 2000 MCG tablet Take 2,000 mcg by mouth daily.   MAGNESIUM GLUCONATE PO Take by mouth.   metaxalone (SKELAXIN) 800 MG tablet TAKE 1/2 TO 1 TABLET BY MOUTH AT BEDTIME AS NEEDED FOR BACK SPASMS   Multiple Vitamin (MULTIVITAMIN) capsule Take 1 capsule by mouth daily.   Vitamin D, Ergocalciferol, (DRISDOL) 50000 units CAPS capsule Take 1 capsule (50,000 Units total) by mouth every 7 (seven) days.   [DISCONTINUED] Ferrous Sulfate (IRON PO) Take by mouth.   No facility-administered encounter medications on file as of 08/29/2021.     Lab Results  Component Value Date   WBC 3.9 (L) 08/29/2021   HGB 12.4 08/29/2021   HCT 37.3 08/29/2021   PLT 240.0 08/29/2021   GLUCOSE 71 08/29/2021   CHOL 201 (H) 05/07/2021   TRIG 41.0 05/07/2021   HDL 116.90 05/07/2021   LDLCALC 76 05/07/2021   ALT 14 08/29/2021   AST 19 08/29/2021   NA 138 08/29/2021   K 4.5 08/29/2021   CL 101 08/29/2021   CREATININE 0.75 08/29/2021   BUN 13 08/29/2021   CO2 30 08/29/2021   TSH 1.310 02/12/2021   HGBA1C 5.4 11/02/2015    MM 3D SCREEN BREAST BILATERAL  Result Date:  08/05/2021 CLINICAL DATA:  Screening. EXAM: DIGITAL SCREENING BILATERAL MAMMOGRAM WITH TOMOSYNTHESIS AND CAD TECHNIQUE: Bilateral screening digital craniocaudal and mediolateral oblique mammograms were obtained. Bilateral screening digital breast tomosynthesis was performed. The images were evaluated with computer-aided detection. COMPARISON:  Previous exam(s). ACR Breast Density Category c: The breast tissue is heterogeneously dense, which may obscure small masses. FINDINGS: There are no findings suspicious for malignancy. IMPRESSION: No mammographic evidence of malignancy. A result letter of this screening mammogram will be mailed directly to the patient. RECOMMENDATION: Screening mammogram in one year. (Code:SM-B-01Y) BI-RADS CATEGORY  1: Negative. Electronically Signed   By: Fidela Salisbury M.D.   On: 08/05/2021 16:42       Assessment & Plan:   Problem List Items Addressed This Visit     Anemia    Has a history of anemia.  Check cbc.        Back pain    Mid back pain as outlined.  Pain - radiates around as outlined.  Unclear etiology.  Question - msk.  No injury or trauma.  Exercises regularly.  Skelaxin as directed.  Check urine to confirm no infection.  Urine dip - leukocytes and nitrite positive.  Treat with omnicef until culture returns.  Discussed  - possible kidney stone.  Start with above.  May need scanning if symptoms persists.  No rash.  Follow.        Relevant Orders   CBC with Differential/Platelet (Completed)   Hepatic function panel (Completed)   Basic metabolic panel (Completed)   Lipase (Completed)   Family history of breast cancer    Had genetic testing as outlined.  Discussed with gyn regarding MRI/mammogram.  Just had mammogram 08/05/21 - Birads I. Discussed MRI alternating mammo.       Other Visit Diagnoses     Frequency of urination    -  Primary   Relevant Orders   POCT Urinalysis Dipstick (Completed)   Urine Culture (Completed)   Urine Microscopic Only  (Completed)        Einar Pheasant, MD

## 2021-08-30 LAB — URINE CULTURE
MICRO NUMBER:: 13762042
Result:: NO GROWTH
SPECIMEN QUALITY:: ADEQUATE

## 2021-08-30 LAB — CBC WITH DIFFERENTIAL/PLATELET
Basophils Absolute: 0.1 10*3/uL (ref 0.0–0.1)
Basophils Relative: 1.5 % (ref 0.0–3.0)
Eosinophils Absolute: 0 10*3/uL (ref 0.0–0.7)
Eosinophils Relative: 1.2 % (ref 0.0–5.0)
HCT: 37.3 % (ref 36.0–46.0)
Hemoglobin: 12.4 g/dL (ref 12.0–15.0)
Lymphocytes Relative: 23.7 % (ref 12.0–46.0)
Lymphs Abs: 0.9 10*3/uL (ref 0.7–4.0)
MCHC: 33.3 g/dL (ref 30.0–36.0)
MCV: 89.6 fl (ref 78.0–100.0)
Monocytes Absolute: 0.4 10*3/uL (ref 0.1–1.0)
Monocytes Relative: 10.9 % (ref 3.0–12.0)
Neutro Abs: 2.4 10*3/uL (ref 1.4–7.7)
Neutrophils Relative %: 62.7 % (ref 43.0–77.0)
Platelets: 240 10*3/uL (ref 150.0–400.0)
RBC: 4.16 Mil/uL (ref 3.87–5.11)
RDW: 14.2 % (ref 11.5–15.5)
WBC: 3.9 10*3/uL — ABNORMAL LOW (ref 4.0–10.5)

## 2021-08-30 LAB — BASIC METABOLIC PANEL
BUN: 13 mg/dL (ref 6–23)
CO2: 30 mEq/L (ref 19–32)
Calcium: 9 mg/dL (ref 8.4–10.5)
Chloride: 101 mEq/L (ref 96–112)
Creatinine, Ser: 0.75 mg/dL (ref 0.40–1.20)
GFR: 91.83 mL/min (ref >60.00)
Glucose, Bld: 71 mg/dL (ref 70–99)
Potassium: 4.5 mEq/L (ref 3.5–5.1)
Sodium: 138 mEq/L (ref 135–145)

## 2021-08-30 LAB — HEPATIC FUNCTION PANEL
ALT: 14 U/L (ref 0–35)
AST: 19 U/L (ref 0–37)
Albumin: 4.4 g/dL (ref 3.5–5.2)
Alkaline Phosphatase: 65 U/L (ref 39–117)
Bilirubin, Direct: 0.1 mg/dL (ref 0.0–0.3)
Total Bilirubin: 0.4 mg/dL (ref 0.2–1.2)
Total Protein: 6.9 g/dL (ref 6.0–8.3)

## 2021-08-30 LAB — URINALYSIS, MICROSCOPIC ONLY

## 2021-08-30 LAB — LIPASE: Lipase: 8 U/L — ABNORMAL LOW (ref 11.0–59.0)

## 2021-09-01 ENCOUNTER — Encounter: Payer: Self-pay | Admitting: Internal Medicine

## 2021-09-01 NOTE — Assessment & Plan Note (Signed)
Has a history of anemia.  Check cbc.   

## 2021-09-01 NOTE — Assessment & Plan Note (Signed)
Had genetic testing as outlined.  Discussed with gyn regarding MRI/mammogram.  Just had mammogram 08/05/21 - Birads I. Discussed MRI alternating mammo.  

## 2021-09-01 NOTE — Assessment & Plan Note (Signed)
Mid back pain as outlined.  Pain - radiates around as outlined.  Unclear etiology.  Question - msk.  No injury or trauma.  Exercises regularly.  Skelaxin as directed.  Check urine to confirm no infection.  Urine dip - leukocytes and nitrite positive.  Treat with omnicef until culture returns.  Discussed  - possible kidney stone.  Start with above.  May need scanning if symptoms persists.  No rash.  Follow.

## 2021-09-06 ENCOUNTER — Ambulatory Visit: Payer: BC Managed Care – PPO | Admitting: Internal Medicine

## 2021-10-29 ENCOUNTER — Encounter: Payer: Self-pay | Admitting: Internal Medicine

## 2021-10-29 ENCOUNTER — Ambulatory Visit: Payer: BC Managed Care – PPO | Admitting: Internal Medicine

## 2021-10-29 VITALS — BP 110/64 | HR 67 | Temp 97.6°F | Ht 67.0 in | Wt 142.8 lb

## 2021-10-29 DIAGNOSIS — M546 Pain in thoracic spine: Secondary | ICD-10-CM

## 2021-10-29 DIAGNOSIS — Z87898 Personal history of other specified conditions: Secondary | ICD-10-CM

## 2021-10-29 DIAGNOSIS — Z86010 Personal history of colonic polyps: Secondary | ICD-10-CM

## 2021-10-29 DIAGNOSIS — F439 Reaction to severe stress, unspecified: Secondary | ICD-10-CM

## 2021-10-29 DIAGNOSIS — Z9189 Other specified personal risk factors, not elsewhere classified: Secondary | ICD-10-CM

## 2021-10-29 DIAGNOSIS — D649 Anemia, unspecified: Secondary | ICD-10-CM | POA: Diagnosis not present

## 2021-10-29 DIAGNOSIS — Z803 Family history of malignant neoplasm of breast: Secondary | ICD-10-CM | POA: Diagnosis not present

## 2021-10-29 DIAGNOSIS — Z8742 Personal history of other diseases of the female genital tract: Secondary | ICD-10-CM

## 2021-10-29 DIAGNOSIS — Z23 Encounter for immunization: Secondary | ICD-10-CM | POA: Diagnosis not present

## 2021-10-29 DIAGNOSIS — D72819 Decreased white blood cell count, unspecified: Secondary | ICD-10-CM

## 2021-10-29 DIAGNOSIS — Z1239 Encounter for other screening for malignant neoplasm of breast: Secondary | ICD-10-CM

## 2021-10-29 NOTE — Progress Notes (Signed)
Patient ID: Adrienne Phillips, female   DOB: August 11, 1970, 51 y.o.   MRN: [id number]   Subjective:    Patient ID: Adrienne Phillips, female    DOB: 1970-08-10, 51 y.o.   MRN: [id number]   Patient here for  Chief Complaint  Patient presents with   Follow-up    4 month follow up   .   HPI Here to follow up regarding anemia.  Last visit, increased back pain.  This has resolved.  Seeing chiropractor.  No increased back pain.  Neck - stable.  Yoga helps.  Family history of breast cancer.  Discussed.  Discussed risk and MRI alternating with mammogram.  Agreeable.  Last mammogram in 07/2021.  Exercises regularly.  No chest pain or sob reported.  No abdominal pain.  Recently went to [place].  Some constipation with travel.  Working to get back regular.  Has colonoscopy planned in 11/2021.     Past Medical History:  Diagnosis Date   Anemia    BRCA negative 02/2021   MyRisk neg except AXIN2 VUS   Family history of breast cancer    Hypoglycemia    Increased risk of breast cancer 02/2021   IBIS=35.4%/riskscore=26.4%   Iron deficiency    Migraine    Ovarian cyst    Past Surgical History:  Procedure Laterality Date   BREAST CYST ASPIRATION Left 1998   BREAST CYST ASPIRATION Left 09/06/2020   1:00 FNA   Family History  Problem Relation Age of Onset   Hypertension Mother        migraines   Colon cancer Father        57s   Skin cancer Father    Breast cancer Sister 44   Rheum arthritis Brother    Osteoporosis Paternal Grandmother    Ulcers Paternal Grandfather    Breast cancer Maternal Aunt        84   Breast cancer Paternal Aunt 37   Social History   Socioeconomic History   Marital status: Married    Spouse name: Not on file   Number of children: 0   Years of education: Not on file   Highest education level: Not on file  Occupational History    Employer: [employer]  Tobacco Use   Smoking status: Never   Smokeless tobacco: Never  Vaping Use   Vaping Use: Never used   Substance and Sexual Activity   Alcohol use: Yes    Alcohol/week: 0.0 standard drinks of alcohol   Drug use: No   Sexual activity: Yes  Other Topics Concern   Not on file  Social History Narrative   Not on file   Social Determinants of Health   Financial Resource Strain: Not on file  Food Insecurity: Not on file  Transportation Needs: Not on file  Physical Activity: Not on file  Stress: Not on file  Social Connections: Not on file     Review of Systems  Constitutional:  Negative for appetite change and unexpected weight change.  HENT:  Negative for congestion and sinus pressure.   Respiratory:  Negative for cough, chest tightness and shortness of breath.   Cardiovascular:  Negative for chest pain, palpitations and leg swelling.  Gastrointestinal:  Negative for abdominal pain, diarrhea, nausea and vomiting.  Genitourinary:  Negative for difficulty urinating and dysuria.  Musculoskeletal:  Negative for joint swelling and myalgias.  Skin:  Negative for color change and rash.  Neurological:  Negative for dizziness, light-headedness and headaches.  Psychiatric/Behavioral:  Negative  for agitation and dysphoric mood.        Objective:     BP 110/64 (BP Location: Left Arm, Patient Position: Sitting, Cuff Size: Normal)   Pulse 67   Temp 97.6 F (36.4 C) (Oral)   Ht '5\' 7"'  (1.702 m)   Wt 142 lb 12.8 oz (64.8 kg)   SpO2 96%   BMI 22.37 kg/m  Wt Readings from Last 3 Encounters:  10/29/21 142 lb 12.8 oz (64.8 kg)  08/29/21 142 lb 3.2 oz (64.5 kg)  05/07/21 145 lb 6.4 oz (66 kg)    Physical Exam Vitals reviewed.  Constitutional:      General: She is not in acute distress.    Appearance: Normal appearance.  HENT:     Head: Normocephalic and atraumatic.     Right Ear: External ear normal.     Left Ear: External ear normal.  Eyes:     General: No scleral icterus.       Right eye: No discharge.        Left eye: No discharge.     Conjunctiva/sclera: Conjunctivae  normal.  Neck:     Thyroid: No thyromegaly.  Cardiovascular:     Rate and Rhythm: Normal rate and regular rhythm.  Pulmonary:     Effort: No respiratory distress.     Breath sounds: Normal breath sounds. No wheezing.  Abdominal:     General: Bowel sounds are normal.     Palpations: Abdomen is soft.     Tenderness: There is no abdominal tenderness.  Musculoskeletal:        General: No swelling or tenderness.     Cervical back: Neck supple. No tenderness.  Lymphadenopathy:     Cervical: No cervical adenopathy.  Skin:    Findings: No erythema or rash.  Neurological:     Mental Status: She is alert.  Psychiatric:        Mood and Affect: Mood normal.        Behavior: Behavior normal.      Outpatient Encounter Medications as of 10/29/2021  Medication Sig   ALPRAZolam (XANAX) 0.25 MG tablet Take 1 tablet (0.25 mg total) by mouth daily as needed for sleep.   ASHWAGANDHA PO    cefdinir (OMNICEF) 300 MG capsule Take 1 capsule (300 mg total) by mouth 2 (two) times daily.   CHARCOAL ACTIVATED PO Take by mouth.   cyanocobalamin 2000 MCG tablet Take 2,000 mcg by mouth daily.   MAGNESIUM GLUCONATE PO Take by mouth.   metaxalone (SKELAXIN) 800 MG tablet TAKE 1/2 TO 1 TABLET BY MOUTH AT BEDTIME AS NEEDED FOR BACK SPASMS   Multiple Vitamin (MULTIVITAMIN) capsule Take 1 capsule by mouth daily.   Vitamin D, Ergocalciferol, (DRISDOL) 50000 units CAPS capsule Take 1 capsule (50,000 Units total) by mouth every 7 (seven) days.   No facility-administered encounter medications on file as of 10/29/2021.     Lab Results  Component Value Date   WBC 3.9 (L) 08/29/2021   HGB 12.4 08/29/2021   HCT 37.3 08/29/2021   PLT 240.0 08/29/2021   GLUCOSE 71 08/29/2021   CHOL 201 (H) 05/07/2021   TRIG 41.0 05/07/2021   HDL 116.90 05/07/2021   LDLCALC 76 05/07/2021   ALT 14 08/29/2021   AST 19 08/29/2021   NA 138 08/29/2021   K 4.5 08/29/2021   CL 101 08/29/2021   CREATININE 0.75 08/29/2021   BUN 13  08/29/2021   CO2 30 08/29/2021   TSH 1.310 02/12/2021  HGBA1C 5.4 11/02/2015    MM 3D SCREEN BREAST BILATERAL  Result Date: 08/05/2021 CLINICAL DATA:  Screening. EXAM: DIGITAL SCREENING BILATERAL MAMMOGRAM WITH TOMOSYNTHESIS AND CAD TECHNIQUE: Bilateral screening digital craniocaudal and mediolateral oblique mammograms were obtained. Bilateral screening digital breast tomosynthesis was performed. The images were evaluated with computer-aided detection. COMPARISON:  Previous exam(s). ACR Breast Density Category c: The breast tissue is heterogeneously dense, which may obscure small masses. FINDINGS: There are no findings suspicious for malignancy. IMPRESSION: No mammographic evidence of malignancy. A result letter of this screening mammogram will be mailed directly to the patient. RECOMMENDATION: Screening mammogram in one year. (Code:SM-B-01Y) BI-RADS CATEGORY  1: Negative. Electronically Signed   By: Fidela Salisbury M.D.   On: 08/05/2021 16:42       Assessment & Plan:   Problem List Items Addressed This Visit     Anemia - Primary    Has a history of anemia.  Check cbc.        Back pain    Pain as resolved.  Has seen chiropractor.  Yoga helps neck and back.       Family history of breast cancer    Had genetic testing as outlined.  Discussed with gyn regarding MRI/mammogram.  Just had mammogram 08/05/21 - Birads I. Discussed MRI alternating mammo.       Relevant Orders   MR BREAST BILATERAL W CONTRAST   History of abnormal cervical Pap smear    PAP 02/12/21 - negative with negative hpv.       History of colonic polyps    Colonoscopy 2018       History of genetic counseling    Given her increased risk of breast cancer, she qualifies for yearly mammograms and screening breast MRIs for better breast imaging, per NCCN guidelines. The studies are staggered every 6 months so there is twice yearly breast screening in hopes of catching anything early.       Leukopenia     Recommended follow cbc as long as stable.       Stress    Overall appears to be handling things well.       Other Visit Diagnoses     Need for immunization against influenza       Relevant Orders   Flu Vaccine QUAD 20moIM (Fluarix, Fluzone & Alfiuria Quad PF) (Completed)   Breast cancer screening, high risk patient       Relevant Orders   MR BREAST BILATERAL W CONTRAST   At high risk for breast cancer       Relevant Orders   MR BREAST BILATERAL W CONTRAST        CEinar Pheasant MD

## 2021-11-03 ENCOUNTER — Encounter: Payer: Self-pay | Admitting: Internal Medicine

## 2021-11-03 NOTE — Assessment & Plan Note (Signed)
Recommended follow cbc as long as stable.

## 2021-11-03 NOTE — Assessment & Plan Note (Signed)
Had genetic testing as outlined.  Discussed with gyn regarding MRI/mammogram.  Just had mammogram 08/05/21 - [facility] I. Discussed MRI alternating mammo.

## 2021-11-03 NOTE — Assessment & Plan Note (Signed)
Pain as resolved.  Has seen chiropractor.  Yoga helps neck and back.

## 2021-11-03 NOTE — Assessment & Plan Note (Signed)
PAP 02/12/21 - negative with negative hpv.

## 2021-11-03 NOTE — Assessment & Plan Note (Signed)
Given her increased risk of breast cancer, she qualifies for yearly mammograms and screening breast MRIs for better breast imaging, per NCCN guidelines. The studies are staggered every 6 months so there is twice yearly breast screening in hopes of catching anything early.

## 2021-11-03 NOTE — Assessment & Plan Note (Signed)
Overall appears to be handling things well.   

## 2021-11-03 NOTE — Assessment & Plan Note (Signed)
Colonoscopy 2018.  

## 2021-11-03 NOTE — Assessment & Plan Note (Signed)
Has a history of anemia.  Check cbc.

## 2021-11-04 ENCOUNTER — Telehealth: Payer: Self-pay | Admitting: Internal Medicine

## 2021-11-04 NOTE — Telephone Encounter (Signed)
-----   Message from Ashley Jacobs sent at 11/04/2021  2:46 PM EDT ----- Regarding: RE: breast mri Yes I called pt and pt is scheduled for 02/05/2022. I will request auth 1 week before. Thank you! Yw!  ----- Message ----- From: Einar Pheasant, MD Sent: 11/03/2021   3:41 PM EDT To: Ashley Jacobs Subject: breast mri                                     Given her increased risk of breast cancer, she qualifies for yearly mammograms and screening breast MRIs for better breast imaging, per NCCN guidelines. The studies are staggered every 6 months so there is twice yearly breast screening in hopes of catching anything early.  She had mammogram 07/2021.  Would be due breast MRI 01/2022. Order is in.  Do not need scheduled until 01/2022.  Thanks.   Dr Nicki Reaper

## 2021-12-23 ENCOUNTER — Encounter: Payer: Self-pay | Admitting: Internal Medicine

## 2021-12-27 NOTE — Telephone Encounter (Signed)
Please call her and let her know that in reviewing her last blood count, her white blood cell count had improved some.  We can schedule an appt to discuss and recheck cbc.  I do not have a problem referring her if needed.

## 2021-12-27 NOTE — Telephone Encounter (Signed)
Sched for 1/3

## 2022-01-22 ENCOUNTER — Ambulatory Visit: Payer: BC Managed Care – PPO | Admitting: Internal Medicine

## 2022-02-27 ENCOUNTER — Other Ambulatory Visit: Payer: Self-pay | Admitting: Internal Medicine

## 2022-02-27 DIAGNOSIS — Z1239 Encounter for other screening for malignant neoplasm of breast: Secondary | ICD-10-CM

## 2022-02-27 NOTE — Progress Notes (Signed)
Order placed for MRI breast.

## 2022-03-21 ENCOUNTER — Encounter: Payer: Self-pay | Admitting: Internal Medicine

## 2022-04-16 ENCOUNTER — Telehealth: Payer: Self-pay

## 2022-04-16 NOTE — Telephone Encounter (Signed)
LMTCB

## 2022-04-16 NOTE — Telephone Encounter (Signed)
-----   Message from Einar Pheasant, MD sent at 04/16/2022  3:35 AM EDT ----- Please call and notify - MRI breast ok.

## 2022-05-02 ENCOUNTER — Encounter: Payer: BC Managed Care – PPO | Admitting: Internal Medicine

## 2022-05-14 ENCOUNTER — Ambulatory Visit: Payer: BC Managed Care – PPO | Admitting: Internal Medicine

## 2022-05-14 ENCOUNTER — Encounter: Payer: Self-pay | Admitting: Internal Medicine

## 2022-05-14 VITALS — BP 116/70 | HR 70 | Temp 98.2°F | Resp 16 | Ht 67.0 in | Wt 142.0 lb

## 2022-05-14 DIAGNOSIS — R1310 Dysphagia, unspecified: Secondary | ICD-10-CM

## 2022-05-14 DIAGNOSIS — D72819 Decreased white blood cell count, unspecified: Secondary | ICD-10-CM

## 2022-05-14 DIAGNOSIS — M2550 Pain in unspecified joint: Secondary | ICD-10-CM

## 2022-05-14 DIAGNOSIS — D649 Anemia, unspecified: Secondary | ICD-10-CM

## 2022-05-14 DIAGNOSIS — Z13220 Encounter for screening for lipoid disorders: Secondary | ICD-10-CM | POA: Diagnosis not present

## 2022-05-14 DIAGNOSIS — Z8742 Personal history of other diseases of the female genital tract: Secondary | ICD-10-CM | POA: Diagnosis not present

## 2022-05-14 DIAGNOSIS — F439 Reaction to severe stress, unspecified: Secondary | ICD-10-CM

## 2022-05-14 DIAGNOSIS — Z0000 Encounter for general adult medical examination without abnormal findings: Secondary | ICD-10-CM

## 2022-05-14 DIAGNOSIS — Z1231 Encounter for screening mammogram for malignant neoplasm of breast: Secondary | ICD-10-CM

## 2022-05-14 LAB — LIPID PANEL
Cholesterol: 224 mg/dL — ABNORMAL HIGH (ref 0–200)
HDL: 125.2 mg/dL (ref >39.00)
LDL Cholesterol: 91 mg/dL (ref 0–99)
NonHDL: 98.79
Total CHOL/HDL Ratio: 2
Triglycerides: 40 mg/dL (ref 0.0–149.0)
VLDL: 8 mg/dL (ref 0.0–40.0)

## 2022-05-14 LAB — C-REACTIVE PROTEIN: CRP: <1 mg/dL (ref 0.5–20.0)

## 2022-05-14 LAB — CBC WITH DIFFERENTIAL/PLATELET
Basophils Absolute: 0.1 10*3/uL (ref 0.0–0.1)
Basophils Relative: 2.4 % (ref 0.0–3.0)
Eosinophils Absolute: 0.1 10*3/uL (ref 0.0–0.7)
Eosinophils Relative: 2.7 % (ref 0.0–5.0)
HCT: 37.6 % (ref 36.0–46.0)
Hemoglobin: 12.8 g/dL (ref 12.0–15.0)
Lymphocytes Relative: 30.7 % (ref 12.0–46.0)
Lymphs Abs: 0.7 10*3/uL (ref 0.7–4.0)
MCHC: 34 g/dL (ref 30.0–36.0)
MCV: 89.3 fl (ref 78.0–100.0)
Monocytes Absolute: 0.3 10*3/uL (ref 0.1–1.0)
Monocytes Relative: 12.3 % — ABNORMAL HIGH (ref 3.0–12.0)
Neutro Abs: 1.3 10*3/uL — ABNORMAL LOW (ref 1.4–7.7)
Neutrophils Relative %: 51.9 % (ref 43.0–77.0)
Platelets: 248 10*3/uL (ref 150.0–400.0)
RBC: 4.21 Mil/uL (ref 3.87–5.11)
RDW: 14.5 % (ref 11.5–15.5)
WBC: 2.4 10*3/uL — ABNORMAL LOW (ref 4.0–10.5)

## 2022-05-14 LAB — BASIC METABOLIC PANEL
BUN: 12 mg/dL (ref 6–23)
CO2: 28 mEq/L (ref 19–32)
Calcium: 9.4 mg/dL (ref 8.4–10.5)
Chloride: 101 mEq/L (ref 96–112)
Creatinine, Ser: 0.71 mg/dL (ref 0.40–1.20)
GFR: 97.59 mL/min (ref >60.00)
Glucose, Bld: 85 mg/dL (ref 70–99)
Potassium: 4.4 mEq/L (ref 3.5–5.1)
Sodium: 139 mEq/L (ref 135–145)

## 2022-05-14 LAB — TSH: TSH: 1.67 u[IU]/mL (ref 0.35–5.50)

## 2022-05-14 LAB — HEPATIC FUNCTION PANEL
ALT: 34 U/L (ref 0–35)
AST: 32 U/L (ref 0–37)
Albumin: 4.5 g/dL (ref 3.5–5.2)
Alkaline Phosphatase: 82 U/L (ref 39–117)
Bilirubin, Direct: 0.1 mg/dL (ref 0.0–0.3)
Total Bilirubin: 0.4 mg/dL (ref 0.2–1.2)
Total Protein: 7.3 g/dL (ref 6.0–8.3)

## 2022-05-14 NOTE — Assessment & Plan Note (Addendum)
Recommended follow cbc as long as stable.  Saw hematology.

## 2022-05-14 NOTE — Progress Notes (Signed)
Subjective:    Patient ID: Adrienne Phillips, female    DOB: Aug 10, 1970, 52 y.o.   MRN: [id number]  Patient here for  Chief Complaint  Patient presents with   Medical Management of Chronic Issues    HPI Here to follow up regarding leukopenia.  She is doing well.  Exercises regularly.  No chest pain or sob reported.  No cough or congestion.  No acid reflux.  Occasional will have some swallowing issues, but this is not a problem if she does not eat fast.  No abdominal pain or bowel change reported.  She is having some joint pains - hands, knees, ankles and shoulders.  Request CRP.  Taking magnesium glycinate q hs.  Sleeping better.  Handling stress.  Discussed alcohol intake.     Past Medical History:  Diagnosis Date   Anemia    BRCA negative 02/2021   MyRisk neg except AXIN2 VUS   Family history of breast cancer    Hypoglycemia    Increased risk of breast cancer 02/2021   IBIS=35.4%/riskscore=26.4%   Iron deficiency    Migraine    Ovarian cyst    Past Surgical History:  Procedure Laterality Date   BREAST CYST ASPIRATION Left 1998   BREAST CYST ASPIRATION Left 09/06/2020   1:00 FNA   Family History  Problem Relation Age of Onset   Hypertension Mother        migraines   Colon cancer Father        68s   Skin cancer Father    Breast cancer Sister 59   Rheum arthritis Brother    Osteoporosis Paternal Grandmother    Ulcers Paternal Grandfather    Breast cancer Maternal Aunt        53   Breast cancer Paternal Aunt 79   Social History   Socioeconomic History   Marital status: Married    Spouse name: Not on file   Number of children: 0   Years of education: Not on file   Highest education level: Not on file  Occupational History    Employer: [employer]  Tobacco Use   Smoking status: Never   Smokeless tobacco: Never  Vaping Use   Vaping Use: Never used  Substance and Sexual Activity   Alcohol use: Yes    Alcohol/week: 0.0 standard drinks of alcohol   Drug use: No    Sexual activity: Yes  Other Topics Concern   Not on file  Social History Narrative   Not on file   Social Determinants of Health   Financial Resource Strain: Not on file  Food Insecurity: Not on file  Transportation Needs: Not on file  Physical Activity: Not on file  Stress: Not on file  Social Connections: Not on file     Review of Systems  Constitutional:  Negative for appetite change and unexpected weight change.  HENT:  Negative for congestion and sinus pressure.   Respiratory:  Negative for cough, chest tightness and shortness of breath.   Cardiovascular:  Negative for chest pain, palpitations and leg swelling.  Gastrointestinal:  Negative for abdominal pain, diarrhea, nausea and vomiting.  Genitourinary:  Negative for difficulty urinating and dysuria.  Musculoskeletal:  Negative for joint swelling and myalgias.  Skin:  Negative for color change and rash.  Neurological:  Negative for dizziness and headaches.  Psychiatric/Behavioral:  Negative for agitation and dysphoric mood.        Objective:     BP 116/70   Pulse 70  Temp 98.2 F (36.8 C)   Resp 16   Ht 5\' 7"  (1.702 m)   Wt 142 lb (64.4 kg)   SpO2 98%   BMI 22.24 kg/m  Wt Readings from Last 3 Encounters:  05/14/22 142 lb (64.4 kg)  10/29/21 142 lb 12.8 oz (64.8 kg)  08/29/21 142 lb 3.2 oz (64.5 kg)    Physical Exam Vitals reviewed.  Constitutional:      General: She is not in acute distress.    Appearance: Normal appearance.  HENT:     Head: Normocephalic and atraumatic.     Right Ear: External ear normal.     Left Ear: External ear normal.  Eyes:     General: No scleral icterus.       Right eye: No discharge.        Left eye: No discharge.     Conjunctiva/sclera: Conjunctivae normal.  Neck:     Thyroid: No thyromegaly.  Cardiovascular:     Rate and Rhythm: Normal rate and regular rhythm.  Pulmonary:     Effort: No respiratory distress.     Breath sounds: Normal breath sounds. No wheezing.   Abdominal:     General: Bowel sounds are normal.     Palpations: Abdomen is soft.     Tenderness: There is no abdominal tenderness.  Musculoskeletal:        General: No swelling or tenderness.     Cervical back: Neck supple. No tenderness.  Lymphadenopathy:     Cervical: No cervical adenopathy.  Skin:    Findings: No erythema or rash.  Neurological:     Mental Status: She is alert.  Psychiatric:        Mood and Affect: Mood normal.        Behavior: Behavior normal.      Outpatient Encounter Medications as of 05/14/2022  Medication Sig   ALPRAZolam (XANAX) 0.25 MG tablet Take 1 tablet (0.25 mg total) by mouth daily as needed for sleep.   ASHWAGANDHA PO    cyanocobalamin 2000 MCG tablet Take 2,000 mcg by mouth daily.   MAGNESIUM GLUCONATE PO Take by mouth.   metaxalone (SKELAXIN) 800 MG tablet TAKE 1/2 TO 1 TABLET BY MOUTH AT BEDTIME AS NEEDED FOR BACK SPASMS   Multiple Vitamin (MULTIVITAMIN) capsule Take 1 capsule by mouth daily.   Vitamin D, Ergocalciferol, (DRISDOL) 50000 units CAPS capsule Take 1 capsule (50,000 Units total) by mouth every 7 (seven) days.   [DISCONTINUED] cefdinir (OMNICEF) 300 MG capsule Take 1 capsule (300 mg total) by mouth 2 (two) times daily.   [DISCONTINUED] CHARCOAL ACTIVATED PO Take by mouth.   No facility-administered encounter medications on file as of 05/14/2022.     Lab Results  Component Value Date   WBC 2.4 Repeated and verified X2. (L) 05/14/2022   HGB 12.8 05/14/2022   HCT 37.6 05/14/2022   PLT 248.0 05/14/2022   GLUCOSE 85 05/14/2022   CHOL 224 (H) 05/14/2022   TRIG 40.0 05/14/2022   HDL 125.20 05/14/2022   LDLCALC 91 05/14/2022   ALT 34 05/14/2022   AST 32 05/14/2022   NA 139 05/14/2022   K 4.4 05/14/2022   CL 101 05/14/2022   CREATININE 0.71 05/14/2022   BUN 12 05/14/2022   CO2 28 05/14/2022   TSH 1.67 05/14/2022   HGBA1C 5.4 11/02/2015    MM 3D SCREEN BREAST BILATERAL  Result Date: 08/05/2021 CLINICAL DATA:  Screening.  EXAM: DIGITAL SCREENING BILATERAL MAMMOGRAM WITH TOMOSYNTHESIS AND CAD TECHNIQUE: Bilateral  screening digital craniocaudal and mediolateral oblique mammograms were obtained. Bilateral screening digital breast tomosynthesis was performed. The images were evaluated with computer-aided detection. COMPARISON:  Previous exam(s). ACR Breast Density Category c: The breast tissue is heterogeneously dense, which may obscure small masses. FINDINGS: There are no findings suspicious for malignancy. IMPRESSION: No mammographic evidence of malignancy. A result letter of this screening mammogram will be mailed directly to the patient. RECOMMENDATION: Screening mammogram in one year. (Code:SM-B-01Y) BI-RADS CATEGORY  1: Negative. Electronically Signed   By: Ted Mcalpine M.D.   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   Colonoscopy 11/2021 - Tubular adenoma, negative for high grade dysplasia. 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 Follow cbc.   Orders: -     CBC with Differential/Platelet -     Hepatic function panel -     Basic metabolic panel -     TSH  Health care maintenance Assessment & Plan: Physical - 05/14/23.  PAP 02/2018 - negative with negative HPV.  GYN - pap 02/12/21 - negative with negative HPV. Mammogram 08/05/21-  Birads I. Colonoscopy 11/2021 - Hemorrhoids found on perianal exam. Three 3 to 6 mm polyps in the descending colon, in the proximal transverse colon and in the mid transverse colon. Internal hemorrhoids. The examination was otherwise normal. Pathology - Tubular adenoma, negative for high grade dysplasia. 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   History of abnormal cervical Pap smear Assessment & Plan: PAP 02/12/21 - negative with negative hpv.     Leukopenia, unspecified type Assessment & Plan: Recommended follow cbc as long as stable.  Saw hematology.   Orders: -     TSH  Visit for screening mammogram -     3D Screening Mammogram, Left and Right; Future  Arthralgia, unspecified joint -     C-reactive protein  Dysphagia, unspecified type Assessment & Plan: Occasional will have some swallowing issues, but this is not a problem if she does not eat fast.    Stress Assessment & Plan: Overall appears to be handling things well.       [employer], MD

## 2022-05-14 NOTE — Assessment & Plan Note (Addendum)
Physical - 05/14/23.  PAP 02/2018 - negative with negative HPV.  GYN - pap 02/12/21 - negative with negative HPV. Mammogram 08/05/21-  Birads I. Colonoscopy 11/2021 - Hemorrhoids found on perianal exam. Three 3 to 6 mm polyps in the descending colon, in the proximal transverse colon and in the mid transverse colon. Internal hemorrhoids. The examination was otherwise normal. Pathology - Tubular adenoma, negative for high grade dysplasia. 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

## 2022-05-14 NOTE — Assessment & Plan Note (Signed)
PAP 02/12/21 - negative with negative hpv.  

## 2022-05-15 ENCOUNTER — Other Ambulatory Visit: Payer: Self-pay

## 2022-05-15 DIAGNOSIS — D72819 Decreased white blood cell count, unspecified: Secondary | ICD-10-CM

## 2022-05-18 ENCOUNTER — Encounter: Payer: Self-pay | Admitting: Internal Medicine

## 2022-05-18 NOTE — Assessment & Plan Note (Signed)
Occasional will have some swallowing issues, but this is not a problem if she does not eat fast.

## 2022-05-18 NOTE — Assessment & Plan Note (Signed)
Overall appears to be handling things well.   

## 2022-05-18 NOTE — Assessment & Plan Note (Signed)
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   Colonoscopy 11/2021 - Tubular adenoma, negative for high grade dysplasia. 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 Follow cbc.

## 2022-05-28 ENCOUNTER — Telehealth: Payer: Self-pay

## 2022-05-28 NOTE — Telephone Encounter (Signed)
Attempted to call patient prior to her new patient appointment with Dr. Cathie Hoops on 05/29/22. Patient did not answer and her voicemail was full.

## 2022-05-29 ENCOUNTER — Inpatient Hospital Stay: Payer: BC Managed Care – PPO | Attending: Oncology | Admitting: Oncology

## 2022-05-29 ENCOUNTER — Encounter: Payer: Self-pay | Admitting: Oncology

## 2022-05-29 ENCOUNTER — Inpatient Hospital Stay: Payer: BC Managed Care – PPO

## 2022-05-29 VITALS — BP 109/82 | HR 63 | Temp 96.0°F | Resp 18 | Wt 145.0 lb

## 2022-05-29 DIAGNOSIS — D72820 Lymphocytosis (symptomatic): Secondary | ICD-10-CM | POA: Diagnosis not present

## 2022-05-29 DIAGNOSIS — Z789 Other specified health status: Secondary | ICD-10-CM | POA: Insufficient documentation

## 2022-05-29 DIAGNOSIS — D708 Other neutropenia: Secondary | ICD-10-CM

## 2022-05-29 DIAGNOSIS — D72819 Decreased white blood cell count, unspecified: Secondary | ICD-10-CM | POA: Insufficient documentation

## 2022-05-29 DIAGNOSIS — F1090 Alcohol use, unspecified, uncomplicated: Secondary | ICD-10-CM | POA: Diagnosis not present

## 2022-05-29 DIAGNOSIS — F1020 Alcohol dependence, uncomplicated: Secondary | ICD-10-CM | POA: Insufficient documentation

## 2022-05-29 LAB — HEPATITIS PANEL, ACUTE: Hepatitis B Surface Ag: NONREACTIVE

## 2022-05-29 LAB — CBC WITH DIFFERENTIAL/PLATELET
Abs Immature Granulocytes: 0.02 10*3/uL (ref 0.00–0.07)
Basophils Absolute: 0.1 10*3/uL (ref 0.0–0.1)
Basophils Relative: 2 %
Eosinophils Absolute: 0 10*3/uL (ref 0.0–0.5)
Eosinophils Relative: 1 %
HCT: 41 % (ref 36.0–46.0)
Hemoglobin: 13.2 g/dL (ref 12.0–15.0)
Immature Granulocytes: 1 %
Lymphocytes Relative: 26 %
Lymphs Abs: 1.1 10*3/uL (ref 0.7–4.0)
MCH: 28.8 pg (ref 26.0–34.0)
MCHC: 32.2 g/dL (ref 30.0–36.0)
MCV: 89.3 fL (ref 80.0–100.0)
Monocytes Absolute: 0.4 10*3/uL (ref 0.1–1.0)
Monocytes Relative: 10 %
Neutro Abs: 2.5 10*3/uL (ref 1.7–7.7)
Neutrophils Relative %: 60 %
Platelets: 256 10*3/uL (ref 150–400)
RBC: 4.59 MIL/uL (ref 3.87–5.11)
RDW: 13.3 % (ref 11.5–15.5)
WBC: 4 10*3/uL (ref 4.0–10.5)
nRBC: 0 % (ref 0.0–0.2)

## 2022-05-29 LAB — FOLATE: Folate: 16.1 ng/mL (ref >5.9)

## 2022-05-29 LAB — PROTIME-INR
INR: 1.1 (ref 0.8–1.2)
Prothrombin Time: 13.9 seconds (ref 11.4–15.2)

## 2022-05-29 LAB — APTT: aPTT: 32 seconds (ref 24–36)

## 2022-05-29 LAB — RETIC PANEL
Immature Retic Fract: 5.1 % (ref 2.3–15.9)
RBC.: 4.55 MIL/uL (ref 3.87–5.11)
Retic Count, Absolute: 41.4 10*3/uL (ref 19.0–186.0)
Retic Ct Pct: 0.9 % (ref 0.4–3.1)
Reticulocyte Hemoglobin: 32.8 pg (ref >27.9)

## 2022-05-29 LAB — TECHNOLOGIST SMEAR REVIEW
Plt Morphology: NORMAL
RBC MORPHOLOGY: NORMAL
WBC MORPHOLOGY: NORMAL

## 2022-05-29 LAB — LACTATE DEHYDROGENASE: LDH: 159 U/L (ref 98–192)

## 2022-05-29 NOTE — Progress Notes (Signed)
Hematology/Oncology Consult Note [phone] [phone]    REFERRING PROVIDER: Dale Bradley, MD   CHIEF COMPLAINTS/REASON FOR VISIT:  Evaluation of leukopenia   ASSESSMENT & PLAN:   Leukopenia I discussed with patient that the differential diagnosis of leukopenia is broad, including acute or chronic infection, inflammation, nutrition deficiency, autoimmune disease,  alcohol use, ethnic, or malignant etiology including underlying bone morrow disorders.  For the work up of patient's leukoepenia, I recommend checking CBC;CMP, LDH; smear review, folate, Vitamin B12, hepatitis, HIV, flowcytometry and monoclonal gammopathy workup, EBV, CMV, ANCA, ANA.    Alcohol use Recommend alcohol cessation.    Orders Placed This Encounter  Procedures   ANA, IFA (with reflex)    Standing Status:   Future    Number of Occurrences:   1    Standing Expiration Date:   05/29/2023   Vitamin B12    Standing Status:   Future    Number of Occurrences:   1    Standing Expiration Date:   05/29/2023   Folate    Standing Status:   Future    Number of Occurrences:   1    Standing Expiration Date:   05/29/2023   CBC with Differential/Platelet    Standing Status:   Future    Number of Occurrences:   1    Standing Expiration Date:   05/29/2023   Retic Panel    Standing Status:   Future    Number of Occurrences:   1    Standing Expiration Date:   05/29/2023   Multiple Myeloma Panel (SPEP&IFE w/QIG)    Standing Status:   Future    Number of Occurrences:   1    Standing Expiration Date:   05/29/2023   Technologist smear review    Standing Status:   Future    Number of Occurrences:   1    Standing Expiration Date:   05/29/2023    Order Specific Question:   Clinical information:    Answer:   neutropenia   Follow up in 3-4 weeks.  All questions were answered. The patient knows to call the clinic with any problems, questions or concerns.  Rickard Patience, MD, PhD [facility] Hematology  Oncology 05/29/2022    HISTORY OF PRESENTING ILLNESS:  Adrienne Phillips is a 52 y.o. female who was seen in consultation at the request of Dale [place], MD for evaluation of leukopenia Reviewed patient's recent labs. Patient has low total WBC count was 2.4 on 05/14/2022,, predominantly neutropenia with ANC of 1.3. Previous lab records reviewed. Leukopenia duration is chronic onset, duration is since at least 2022.  Patient denies fatigue, weight loss, fever, chills, frequent infection.  Denies history hepatitis or HIV infection Denies history of chronic liver disease Patient drinks 2-3 bottles of wine per week.  Mostly on weekends. Vitamin B12 supplementation is on her medication list, she is not currently taking. 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  Recently she has had a CRP checked which were normal.  Denies any skin rash.  She has a family history of autoimmune disease.   MEDICAL HISTORY:  Past Medical History:  Diagnosis Date   Anemia    BRCA negative 02/2021   MyRisk neg except AXIN2 VUS   Family history of breast cancer    Hypoglycemia    Increased risk of breast cancer 02/2021   IBIS=35.4%/riskscore=26.4%   Iron deficiency    Migraine  Ovarian cyst     SURGICAL HISTORY: Past Surgical History:  Procedure Laterality Date   BREAST CYST ASPIRATION Left 1998   BREAST CYST ASPIRATION Left 09/06/2020   1:00 FNA    SOCIAL HISTORY: Social History   Socioeconomic History   Marital status: Married    Spouse name: Not on file   Number of children: 0   Years of education: Not on file   Highest education level: Not on file  Occupational History    Employer: [employer]  Tobacco Use   Smoking status: Never   Smokeless tobacco: Never  Vaping Use   Vaping Use: Never used  Substance and Sexual Activity   Alcohol use: Yes    Comment: 2-3 bottles wine per week.   Drug use: No   Sexual activity: Yes  Other  Topics Concern   Not on file  Social History Narrative   Not on file   Social Determinants of Health   Financial Resource Strain: Low Risk  (05/29/2022)   Overall Financial Resource Strain (CARDIA)    Difficulty of Paying Living Expenses: Not very hard  Food Insecurity: No Food Insecurity (05/29/2022)   Hunger Vital Sign    Worried About Running Out of Food in the Last Year: Never true    Ran Out of Food in the Last Year: Never true  Transportation Needs: No Transportation Needs (05/29/2022)   PRAPARE - Administrator, Civil Service (Medical): No    Lack of Transportation (Non-Medical): No  Physical Activity: Not on file  Stress: No Stress Concern Present (05/29/2022)   [employer] of Occupational Health - Occupational Stress Questionnaire    Feeling of Stress : Not at all  Social Connections: Not on file  Intimate Partner Violence: Not At Risk (05/29/2022)   Humiliation, Afraid, Rape, and Kick questionnaire    Fear of Current or Ex-Partner: No    Emotionally Abused: No    Physically Abused: No    Sexually Abused: No    FAMILY HISTORY: Family History  Problem Relation Age of Onset   Hypertension Mother        migraines   Colon cancer Father        14s   Skin cancer Father    Breast cancer Sister 73   Rheum arthritis Brother    Osteoporosis Paternal Grandmother    Ulcers Paternal Grandfather    Breast cancer Maternal Aunt        39   Breast cancer Paternal Aunt 44    ALLERGIES:  is allergic to codeine and topamax [topiramate].  MEDICATIONS:  Current Outpatient Medications  Medication Sig Dispense Refill   ALPRAZolam (XANAX) 0.25 MG tablet Take 1 tablet (0.25 mg total) by mouth daily as needed for sleep. 15 tablet 0   ASHWAGANDHA PO      cyanocobalamin 2000 MCG tablet Take 2,000 mcg by mouth daily.     MAGNESIUM GLUCONATE PO Take by mouth.     metaxalone (SKELAXIN) 800 MG tablet TAKE 1/2 TO 1 TABLET BY MOUTH AT BEDTIME AS NEEDED FOR BACK SPASMS 30 tablet  0   Multiple Vitamin (MULTIVITAMIN) capsule Take 1 capsule by mouth daily.     Vitamin D, Ergocalciferol, (DRISDOL) 50000 units CAPS capsule Take 1 capsule (50,000 Units total) by mouth every 7 (seven) days. 8 capsule 0   No current facility-administered medications for this visit.     Review of Systems  Constitutional:  Negative for appetite change, chills, fatigue and fever.  HENT:  Negative for hearing loss and voice change.   Eyes:  Negative for eye problems.  Respiratory:  Negative for chest tightness and cough.   Cardiovascular:  Negative for chest pain.  Gastrointestinal:  Negative for abdominal distention, abdominal pain and blood in stool.  Endocrine: Negative for hot flashes.  Genitourinary:  Negative for difficulty urinating and frequency.   Musculoskeletal:  Positive for arthralgias.  Skin:  Negative for itching and rash.  Neurological:  Negative for extremity weakness.  Hematological:  Negative for adenopathy.  Psychiatric/Behavioral:  Negative for confusion.     PHYSICAL EXAMINATION: ECOG PERFORMANCE STATUS: 0 - Asymptomatic Vitals:   05/29/22 1109  BP: 109/82  Pulse: 63  Resp: 18  Temp: (!) 96 F (35.6 C)  SpO2: 100%   Filed Weights   05/29/22 1109  Weight: 145 lb (65.8 kg)     Physical Exam Constitutional:      General: She is not in acute distress. HENT:     Head: Normocephalic and atraumatic.  Eyes:     General: No scleral icterus. Cardiovascular:     Rate and Rhythm: Normal rate and regular rhythm.     Heart sounds: Normal heart sounds.  Pulmonary:     Effort: Pulmonary effort is normal. No respiratory distress.     Breath sounds: No wheezing.  Abdominal:     General: Bowel sounds are normal. There is no distension.     Palpations: Abdomen is soft.  Musculoskeletal:        General: No deformity. Normal range of motion.     Cervical back: Normal range of motion and neck supple.  Skin:    General: Skin is warm and dry.     Findings: No  erythema or rash.  Neurological:     Mental Status: She is alert and oriented to person, place, and time. Mental status is at baseline.     Cranial Nerves: No cranial nerve deficit.     Coordination: Coordination normal.  Psychiatric:        Mood and Affect: Mood normal.     RADIOGRAPHIC STUDIES: I have personally reviewed the radiological images as listed and agreed with the findings in the report. No results found.   LABORATORY DATA:  I have reviewed the data as listed    Latest Ref Rng & Units 05/29/2022   12:02 PM 05/14/2022    7:46 AM 08/29/2021    2:21 PM  CBC  WBC 4.0 - 10.5 K/uL 4.0  2.4 Repeated and verified X2.  3.9   Hemoglobin 12.0 - 15.0 g/dL 16.1  09.6  04.5   Hematocrit 36.0 - 46.0 % 41.0  37.6  37.3   Platelets 150 - 400 K/uL 256  248.0  240.0       Latest Ref Rng & Units 05/14/2022    7:46 AM 08/29/2021    2:21 PM 05/07/2021    9:00 AM  CMP  Glucose 70 - 99 mg/dL 85  71  87   BUN 6 - 23 mg/dL 12  13  7    Creatinine 0.40 - 1.20 mg/dL 4.09  8.11  9.14   Sodium 135 - 145 mEq/L 139  138  137   Potassium 3.5 - 5.1 mEq/L 4.4  4.5  4.8   Chloride 96 - 112 mEq/L 101  101  101   CO2 19 - 32 mEq/L 28  30  31    Calcium 8.4 - 10.5 mg/dL 9.4  9.0  9.1   Total Protein 6.0 -  8.3 g/dL 7.3  6.9  7.1   Total Bilirubin 0.2 - 1.2 mg/dL 0.4  0.4  0.5   Alkaline Phos 39 - 117 U/L 82  65  75   AST 0 - 37 U/L 32  19  26   ALT 0 - 35 U/L 34  14  29        RADIOGRAPHIC STUDIES: I have personally reviewed the radiological images as listed and agreed with the findings in the report. No results found.

## 2022-05-29 NOTE — Assessment & Plan Note (Signed)
Recommend alcohol cessation.  

## 2022-05-29 NOTE — Assessment & Plan Note (Addendum)
I discussed with patient that the differential diagnosis of leukopenia is broad, including acute or chronic infection, inflammation, nutrition deficiency, autoimmune disease,  alcohol use, ethnic, or malignant etiology including underlying bone morrow disorders.  For the work up of patient's leukoepenia, I recommend checking CBC;CMP, LDH; smear review, folate, Vitamin B12, hepatitis, HIV, flowcytometry and monoclonal gammopathy workup, EBV, CMV, ANCA, ANA.

## 2022-05-30 LAB — KAPPA/LAMBDA LIGHT CHAINS
Kappa free light chain: 16.3 mg/L (ref 3.3–19.4)
Kappa, lambda light chain ratio: 1.18 (ref 0.26–1.65)
Lambda free light chains: 13.8 mg/L (ref 5.7–26.3)

## 2022-05-30 LAB — HIV ANTIBODY (ROUTINE TESTING W REFLEX): HIV Screen 4th Generation wRfx: NONREACTIVE

## 2022-05-30 LAB — HEPATITIS PANEL, ACUTE
HCV Ab: NONREACTIVE
Hep A IgM: NONREACTIVE

## 2022-05-30 LAB — VITAMIN B12: Vitamin B-12: 307 pg/mL (ref 180–914)

## 2022-05-31 LAB — CMV DNA, QUANTITATIVE, PCR
CMV DNA Quant: NEGATIVE IU/mL
Log10 CMV Qn DNA Pl: UNDETERMINED log10 IU/mL

## 2022-05-31 LAB — EPSTEIN BARR VRS(EBV DNA BY PCR): EBV DNA QN by PCR: NEGATIVE IU/mL

## 2022-06-01 LAB — ANTINUCLEAR ANTIBODIES, IFA: ANA Ab, IFA: NEGATIVE

## 2022-06-02 LAB — ANCA TITERS
Atypical P-ANCA titer: <1:20 {titer}
C-ANCA: <1:20 {titer}
P-ANCA: <1:20 {titer}

## 2022-06-02 LAB — COMP PANEL: LEUKEMIA/LYMPHOMA: Immunophenotypic Profile: 0

## 2022-06-03 ENCOUNTER — Encounter: Payer: Self-pay | Admitting: Oncology

## 2022-06-04 ENCOUNTER — Inpatient Hospital Stay: Payer: BC Managed Care – PPO | Admitting: Oncology

## 2022-06-04 ENCOUNTER — Encounter: Payer: Self-pay | Admitting: Oncology

## 2022-06-04 VITALS — BP 110/75 | HR 70 | Temp 97.2°F | Resp 18 | Wt 145.2 lb

## 2022-06-04 DIAGNOSIS — D72819 Decreased white blood cell count, unspecified: Secondary | ICD-10-CM | POA: Diagnosis not present

## 2022-06-04 DIAGNOSIS — Z789 Other specified health status: Secondary | ICD-10-CM

## 2022-06-04 DIAGNOSIS — D72820 Lymphocytosis (symptomatic): Secondary | ICD-10-CM

## 2022-06-04 DIAGNOSIS — D708 Other neutropenia: Secondary | ICD-10-CM

## 2022-06-04 NOTE — Progress Notes (Signed)
Hematology/Oncology Consult Note [phone] [phone]    REFERRING PROVIDER: Dale [place], MD   CHIEF COMPLAINTS/REASON FOR VISIT:  leukopenia   ASSESSMENT & PLAN:   Leukopenia Labs are reviewed and discussed with patient. normal CBC with differential; normal  LDH; normal folate and Vitamin B12, negative hepatitis, negative HIV, negative EBV, CMV, ANCA, ANA.   Possible due to alcohol use  Alcohol use Recommend alcohol cessation.   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 Recommend observation.    Orders Placed This Encounter  Procedures   CBC with Differential (Cancer Center Only)    Standing Status:   Future    Standing Expiration Date:   06/04/2023   Vitamin B12    Standing Status:   Future    Standing Expiration Date:   06/04/2023   Flow cytometry panel-leukemia/lymphoma work-up    Standing Status:   Future    Standing Expiration Date:   06/04/2023   Follow up in 6 months.  All questions were answered. The patient knows to call the clinic with any problems, questions or concerns.  Rickard Patience, MD, PhD [facility] Hematology Oncology 06/04/2022    HISTORY OF PRESENTING ILLNESS:  Adrienne Phillips is a 52 y.o. female who was seen in consultation at the request of Dale [place], MD for evaluation of leukopenia Reviewed patient's recent labs. Patient has low total WBC count was 2.4 on 05/14/2022,, predominantly neutropenia with ANC of 1.3. Previous lab records reviewed. Leukopenia duration is chronic onset, duration is since at least 2022.  Patient denies fatigue, weight loss,  fever, chills, frequent infection.  Denies history hepatitis or HIV infection Denies history of chronic liver disease Patient drinks 2-3 bottles of wine per week.  Mostly on weekends. Vitamin B12 supplementation is on her medication list, she is not currently taking. 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  Recently she has had a CRP checked which were normal.  Denies any skin rash.  She has a family history of autoimmune disease.  INTERVAL HISTORY Adrienne Phillips is a 52 y.o. female who has above history reviewed by me today presents for follow up visit for leukopenia.  She presents to discuss results.   MEDICAL HISTORY:  Past Medical History:  Diagnosis Date   Anemia    BRCA negative 02/2021   MyRisk neg except AXIN2 VUS   Family history of breast cancer    Hypoglycemia    Increased risk of breast cancer 02/2021   IBIS=35.4%/riskscore=26.4%   Iron deficiency    Migraine    Ovarian cyst     SURGICAL HISTORY: Past Surgical History:  Procedure Laterality Date   BREAST CYST ASPIRATION Left 1998   BREAST CYST ASPIRATION Left 09/06/2020   1:00 FNA    SOCIAL HISTORY: Social History   Socioeconomic History   Marital status: Married    Spouse name: Not on file   Number of children: 0   Years of education: Not on file   Highest education level: Not on file  Occupational History    Employer: [employer]  Tobacco Use   Smoking status: Never   Smokeless tobacco: Never  Vaping Use  Vaping Use: Never used  Substance and Sexual Activity   Alcohol use: Yes    Comment: 2-3 bottles wine per week.   Drug use: No   Sexual activity: Yes  Other Topics Concern   Not on file  Social History Narrative   Not on file   Social Determinants of Health   Financial Resource Strain: Low Risk  (05/29/2022)   Overall Financial Resource Strain (CARDIA)    Difficulty of Paying Living Expenses: Not very hard  Food Insecurity: No Food  Insecurity (05/29/2022)   Hunger Vital Sign    Worried About Running Out of Food in the Last Year: Never true    Ran Out of Food in the Last Year: Never true  Transportation Needs: No Transportation Needs (05/29/2022)   PRAPARE - Administrator, Civil Service (Medical): No    Lack of Transportation (Non-Medical): No  Physical Activity: Not on file  Stress: No Stress Concern Present (05/29/2022)   [employer] of Occupational Health - Occupational Stress Questionnaire    Feeling of Stress : Not at all  Social Connections: Not on file  Intimate Partner Violence: Not At Risk (05/29/2022)   Humiliation, Afraid, Rape, and Kick questionnaire    Fear of Current or Ex-Partner: No    Emotionally Abused: No    Physically Abused: No    Sexually Abused: No    FAMILY HISTORY: Family History  Problem Relation Age of Onset   Hypertension Mother        migraines   Colon cancer Father        14s   Skin cancer Father    Breast cancer Sister 67   Rheum arthritis Brother    Osteoporosis Paternal Grandmother    Ulcers Paternal Grandfather    Breast cancer Maternal Aunt        72   Breast cancer Paternal Aunt 22    ALLERGIES:  is allergic to codeine and topamax [topiramate].  MEDICATIONS:  Current Outpatient Medications  Medication Sig Dispense Refill   ALPRAZolam (XANAX) 0.25 MG tablet Take 1 tablet (0.25 mg total) by mouth daily as needed for sleep. 15 tablet 0   ascorbic acid (VITAMIN C) 500 MG tablet Take 500 mg by mouth daily.     ASHWAGANDHA PO      cyanocobalamin 2000 MCG tablet Take 2,000 mcg by mouth daily.     MAGNESIUM GLUCONATE PO Take by mouth.     Multiple Vitamin (MULTIVITAMIN) capsule Take 1 capsule by mouth daily.     Vitamin D, Ergocalciferol, (DRISDOL) 50000 units CAPS capsule Take 1 capsule (50,000 Units total) by mouth every 7 (seven) days. 8 capsule 0   vitamin E 200 UNIT capsule Take 200 Units by mouth daily.     metaxalone (SKELAXIN) 800 MG tablet TAKE 1/2  TO 1 TABLET BY MOUTH AT BEDTIME AS NEEDED FOR BACK SPASMS (Patient not taking: Reported on 06/04/2022) 30 tablet 0   No current facility-administered medications for this visit.     Review of Systems  Constitutional:  Negative for appetite change, chills, fatigue and fever.  HENT:   Negative for hearing loss and voice change.   Eyes:  Negative for eye problems.  Respiratory:  Negative for chest tightness and cough.   Cardiovascular:  Negative for chest pain.  Gastrointestinal:  Negative for abdominal distention, abdominal pain and blood in stool.  Endocrine: Negative for hot flashes.  Genitourinary:  Negative for difficulty urinating and frequency.   Musculoskeletal:  Positive  for arthralgias.  Skin:  Negative for itching and rash.  Neurological:  Negative for extremity weakness.  Hematological:  Negative for adenopathy.  Psychiatric/Behavioral:  Negative for confusion.     PHYSICAL EXAMINATION: ECOG PERFORMANCE STATUS: 0 - Asymptomatic Vitals:   06/04/22 1412  BP: 110/75  Pulse: 70  Resp: 18  Temp: (!) 97.2 F (36.2 C)   Filed Weights   06/04/22 1412  Weight: 145 lb 3.2 oz (65.9 kg)     Physical Exam Constitutional:      General: She is not in acute distress. HENT:     Head: Normocephalic and atraumatic.  Eyes:     General: No scleral icterus. Cardiovascular:     Rate and Rhythm: Normal rate.  Pulmonary:     Effort: Pulmonary effort is normal. No respiratory distress.  Abdominal:     General: There is no distension.  Musculoskeletal:        General: Normal range of motion.     Cervical back: Normal range of motion and neck supple.  Skin:    Findings: No erythema or rash.  Neurological:     Mental Status: She is alert and oriented to person, place, and time. Mental status is at baseline.     Cranial Nerves: No cranial nerve deficit.  Psychiatric:        Mood and Affect: Mood normal.     RADIOGRAPHIC STUDIES: I have personally reviewed the radiological  images as listed and agreed with the findings in the report. No results found.   LABORATORY DATA:  I have reviewed the data as listed    Latest Ref Rng & Units 05/29/2022   12:02 PM 05/14/2022    7:46 AM 08/29/2021    2:21 PM  CBC  WBC 4.0 - 10.5 K/uL 4.0  2.4 Repeated and verified X2.  3.9   Hemoglobin 12.0 - 15.0 g/dL 04.5  40.9  81.1   Hematocrit 36.0 - 46.0 % 41.0  37.6  37.3   Platelets 150 - 400 K/uL 256  248.0  240.0       Latest Ref Rng & Units 05/14/2022    7:46 AM 08/29/2021    2:21 PM 05/07/2021    9:00 AM  CMP  Glucose 70 - 99 mg/dL 85  71  87   BUN 6 - 23 mg/dL 12  13  7    Creatinine 0.40 - 1.20 mg/dL 9.14  7.82  9.56   Sodium 135 - 145 mEq/L 139  138  137   Potassium 3.5 - 5.1 mEq/L 4.4  4.5  4.8   Chloride 96 - 112 mEq/L 101  101  101   CO2 19 - 32 mEq/L 28  30  31    Calcium 8.4 - 10.5 mg/dL 9.4  9.0  9.1   Total Protein 6.0 - 8.3 g/dL 7.3  6.9  7.1   Total Bilirubin 0.2 - 1.2 mg/dL 0.4  0.4  0.5   Alkaline Phos 39 - 117 U/L 82  65  75   AST 0 - 37 U/L 32  19  26   ALT 0 - 35 U/L 34  14  29        RADIOGRAPHIC STUDIES: I have personally reviewed the radiological images as listed and agreed with the findings in the report. No results found.

## 2022-06-04 NOTE — Assessment & Plan Note (Signed)
Recommend alcohol cessation.  

## 2022-06-04 NOTE — Assessment & Plan Note (Addendum)
Labs are reviewed and discussed with patient. normal CBC with differential; normal  LDH; normal folate and Vitamin B12, negative hepatitis, negative HIV, negative EBV, CMV, ANCA, ANA.   Possible due to alcohol use

## 2022-06-04 NOTE — Assessment & Plan Note (Signed)
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 Recommend observation.

## 2022-06-04 NOTE — Patient Instructions (Signed)
.  ctapp

## 2022-06-05 LAB — MULTIPLE MYELOMA PANEL, SERUM
Albumin SerPl Elph-Mcnc: 4.6 g/dL — ABNORMAL HIGH (ref 2.9–4.4)
Albumin/Glob SerPl: 1.4 (ref 0.7–1.7)
Alpha 1: 0.3 g/dL (ref 0.0–0.4)
Alpha2 Glob SerPl Elph-Mcnc: 0.8 g/dL (ref 0.4–1.0)
B-Globulin SerPl Elph-Mcnc: 1.1 g/dL (ref 0.7–1.3)
Gamma Glob SerPl Elph-Mcnc: 1.2 g/dL (ref 0.4–1.8)
Globulin, Total: 3.4 g/dL (ref 2.2–3.9)
IgA: 171 mg/dL (ref 87–352)
IgG (Immunoglobin G), Serum: 1035 mg/dL (ref 586–1602)
IgM (Immunoglobulin M), Srm: 166 mg/dL (ref 26–217)
Total Protein ELP: 8 g/dL (ref 6.0–8.5)

## 2022-07-01 ENCOUNTER — Ambulatory Visit: Payer: BC Managed Care – PPO | Admitting: Oncology

## 2022-08-20 ENCOUNTER — Encounter (INDEPENDENT_AMBULATORY_CARE_PROVIDER_SITE_OTHER): Payer: Self-pay

## 2022-08-20 ENCOUNTER — Ambulatory Visit
Admission: RE | Admit: 2022-08-20 | Discharge: 2022-08-20 | Disposition: A | Discharge status: Home / Self Care | Payer: BC Managed Care – PPO | Source: Ambulatory Visit | Attending: Internal Medicine | Admitting: Internal Medicine

## 2022-08-20 DIAGNOSIS — Z1231 Encounter for screening mammogram for malignant neoplasm of breast: Secondary | ICD-10-CM | POA: Insufficient documentation

## 2022-09-02 ENCOUNTER — Encounter: Payer: Self-pay | Admitting: Internal Medicine

## 2022-09-16 ENCOUNTER — Ambulatory Visit (INDEPENDENT_AMBULATORY_CARE_PROVIDER_SITE_OTHER): Payer: BC Managed Care – PPO | Admitting: Internal Medicine

## 2022-09-16 ENCOUNTER — Encounter: Payer: Self-pay | Admitting: Internal Medicine

## 2022-09-16 VITALS — BP 114/72 | HR 64 | Temp 97.8°F | Ht 67.0 in | Wt 143.2 lb

## 2022-09-16 DIAGNOSIS — Z13220 Encounter for screening for lipoid disorders: Secondary | ICD-10-CM | POA: Diagnosis not present

## 2022-09-16 DIAGNOSIS — K2270 Barrett's esophagus without dysplasia: Secondary | ICD-10-CM

## 2022-09-16 DIAGNOSIS — F1020 Alcohol dependence, uncomplicated: Secondary | ICD-10-CM

## 2022-09-16 DIAGNOSIS — Z0000 Encounter for general adult medical examination without abnormal findings: Secondary | ICD-10-CM | POA: Diagnosis not present

## 2022-09-16 DIAGNOSIS — F439 Reaction to severe stress, unspecified: Secondary | ICD-10-CM

## 2022-09-16 DIAGNOSIS — D649 Anemia, unspecified: Secondary | ICD-10-CM

## 2022-09-16 DIAGNOSIS — D72819 Decreased white blood cell count, unspecified: Secondary | ICD-10-CM | POA: Diagnosis not present

## 2022-09-16 LAB — HEPATIC FUNCTION PANEL
ALT: 29 U/L (ref 0–35)
AST: 25 U/L (ref 0–37)
Albumin: 4.1 g/dL (ref 3.5–5.2)
Alkaline Phosphatase: 83 U/L (ref 39–117)
Bilirubin, Direct: 0.1 mg/dL (ref 0.0–0.3)
Total Bilirubin: 0.4 mg/dL (ref 0.2–1.2)
Total Protein: 6.9 g/dL (ref 6.0–8.3)

## 2022-09-16 LAB — LIPID PANEL
Cholesterol: 214 mg/dL — ABNORMAL HIGH (ref 0–200)
HDL: 96.5 mg/dL (ref >39.00)
LDL Cholesterol: 108 mg/dL — ABNORMAL HIGH (ref 0–99)
NonHDL: 117.3
Total CHOL/HDL Ratio: 2
Triglycerides: 48 mg/dL (ref 0.0–149.0)
VLDL: 9.6 mg/dL (ref 0.0–40.0)

## 2022-09-16 LAB — CBC WITH DIFFERENTIAL/PLATELET
Basophils Absolute: 0.1 10*3/uL (ref 0.0–0.1)
Basophils Relative: 2.2 % (ref 0.0–3.0)
Eosinophils Absolute: 0.1 10*3/uL (ref 0.0–0.7)
Eosinophils Relative: 2.7 % (ref 0.0–5.0)
HCT: 37 % (ref 36.0–46.0)
Hemoglobin: 12.1 g/dL (ref 12.0–15.0)
Lymphocytes Relative: 32.4 % (ref 12.0–46.0)
Lymphs Abs: 0.9 10*3/uL (ref 0.7–4.0)
MCHC: 32.6 g/dL (ref 30.0–36.0)
MCV: 89.3 fl (ref 78.0–100.0)
Monocytes Absolute: 0.3 10*3/uL (ref 0.1–1.0)
Monocytes Relative: 12.1 % — ABNORMAL HIGH (ref 3.0–12.0)
Neutro Abs: 1.4 10*3/uL (ref 1.4–7.7)
Neutrophils Relative %: 50.6 % (ref 43.0–77.0)
Platelets: 244 10*3/uL (ref 150.0–400.0)
RBC: 4.15 Mil/uL (ref 3.87–5.11)
RDW: 14.2 % (ref 11.5–15.5)
WBC: 2.7 10*3/uL — ABNORMAL LOW (ref 4.0–10.5)

## 2022-09-16 LAB — BASIC METABOLIC PANEL
BUN: 14 mg/dL (ref 6–23)
CO2: 29 mEq/L (ref 19–32)
Calcium: 9.1 mg/dL (ref 8.4–10.5)
Chloride: 100 mEq/L (ref 96–112)
Creatinine, Ser: 0.71 mg/dL (ref 0.40–1.20)
GFR: 97.36 mL/min (ref >60.00)
Glucose, Bld: 96 mg/dL (ref 70–99)
Potassium: 4.5 mEq/L (ref 3.5–5.1)
Sodium: 137 mEq/L (ref 135–145)

## 2022-09-16 NOTE — Assessment & Plan Note (Signed)
11/2022- EGD - no evidence of Barrett's esophagus, LA Grade A esophagitis, one superficial esophageal ulcer with no bleeding in lower third of esophagus, esophageal candidiasis, normal stomach and duodenum

## 2022-09-16 NOTE — Assessment & Plan Note (Signed)
Physical - 09/16/22.  PAP 02/12/21 - negative with negative HPV.  Mammogram 08/20/22 - Birads I. Colonoscopy 11/2021 - Hemorrhoids found on perianal exam. Three 3 to 6 mm polyps in the descending colon, in the proximal transverse colon and in the mid transverse colon. Internal hemorrhoids. The examination was otherwise normal. Pathology - Tubular adenoma, negative for high grade dysplasia. 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

## 2022-09-16 NOTE — Progress Notes (Signed)
Subjective:    Patient ID: Adrienne Phillips, female    DOB: [birth year], 52 y.o.   MRN: [id number]  Patient here for  Chief Complaint  Patient presents with   Annual Exam    HPI Here for a physical exam. Saw gyn - pap 02/12/21  - negative with negative HPV. Stays active.  Exercises regularly.  Taking magnesium glycinate. Seeing Dr Cathie Hoops for f/u leukopenia - secondary to possible alcohol use.  Recommended alcohol cessation. Also, diagnosed - monoclonal B-cell lymphocytosis of undetermined significance.  Recommended observation. She reports she is doing well.  Handling stress.  No chest pain or sob reported.  No cough or congestion.  No abdominal pain reported.  Some vaginal dryness - KY jelly working.  Is exercising regularly.    Past Medical History:  Diagnosis Date   Anemia    BRCA negative 02/2021   MyRisk neg except AXIN2 VUS   Family history of breast cancer    Hypoglycemia    Increased risk of breast cancer 02/2021   IBIS=35.4%/riskscore=26.4%   Iron deficiency    Migraine    Ovarian cyst    Past Surgical History:  Procedure Laterality Date   BREAST CYST ASPIRATION Left 1998   BREAST CYST ASPIRATION Left 09/06/2020   1:00 FNA   Family History  Problem Relation Age of Onset   Hypertension Mother        migraines   Colon cancer Father        81s   Skin cancer Father    Breast cancer Sister 51   Rheum arthritis Brother    Osteoporosis Paternal Grandmother    Ulcers Paternal Grandfather    Breast cancer Maternal Aunt        7   Breast cancer Paternal Aunt 82   Social History   Socioeconomic History   Marital status: Married    Spouse name: Not on file   Number of children: 0   Years of education: Not on file   Highest education level: Not on file  Occupational History    Employer: [employer]  Tobacco Use   Smoking status: Never   Smokeless tobacco: Never  Vaping Use   Vaping status: Never Used  Substance and Sexual Activity   Alcohol use: Yes    Comment: 2-3  bottles wine per week.   Drug use: No   Sexual activity: Yes  Other Topics Concern   Not on file  Social History Narrative   Not on file   Social Determinants of Health   Financial Resource Strain: Low Risk  (05/29/2022)   Overall Financial Resource Strain (CARDIA)    Difficulty of Paying Living Expenses: Not very hard  Food Insecurity: No Food Insecurity (05/29/2022)   Hunger Vital Sign    Worried About Running Out of Food in the Last Year: Never true    Ran Out of Food in the Last Year: Never true  Transportation Needs: No Transportation Needs (05/29/2022)   PRAPARE - Administrator, Civil Service (Medical): No    Lack of Transportation (Non-Medical): No  Physical Activity: Not on file  Stress: No Stress Concern Present (05/29/2022)   [employer] of Occupational Health - Occupational Stress Questionnaire    Feeling of Stress : Not at all  Social Connections: Not on file     Review of Systems  Constitutional:  Negative for appetite change and unexpected weight change.  HENT:  Negative for congestion, sinus pressure and sore throat.  Eyes:  Negative for pain and visual disturbance.  Respiratory:  Negative for cough, chest tightness and shortness of breath.   Cardiovascular:  Negative for chest pain, palpitations and leg swelling.  Gastrointestinal:  Negative for abdominal pain, diarrhea, nausea and vomiting.  Genitourinary:  Negative for difficulty urinating and dysuria.  Musculoskeletal:  Negative for joint swelling and myalgias.  Skin:  Negative for color change and rash.  Neurological:  Negative for dizziness and headaches.  Hematological:  Negative for adenopathy. Does not bruise/bleed easily.  Psychiatric/Behavioral:  Negative for agitation and decreased concentration.        Objective:     BP 114/72   Pulse 64   Temp 97.8 F (36.6 C)   Ht 5\' 7"  (1.702 m)   Wt 143 lb 3.2 oz (65 kg)   SpO2 99%   BMI 22.43 kg/m  Wt Readings from Last 3 Encounters:   09/16/22 143 lb 3.2 oz (65 kg)  06/04/22 145 lb 3.2 oz (65.9 kg)  05/29/22 145 lb (65.8 kg)    Physical Exam Vitals reviewed.  Constitutional:      General: She is not in acute distress.    Appearance: Normal appearance. She is well-developed.  HENT:     Head: Normocephalic and atraumatic.     Right Ear: External ear normal.     Left Ear: External ear normal.  Eyes:     General: No scleral icterus.       Right eye: No discharge.        Left eye: No discharge.     Conjunctiva/sclera: Conjunctivae normal.  Neck:     Thyroid: No thyromegaly.  Cardiovascular:     Rate and Rhythm: Normal rate and regular rhythm.  Pulmonary:     Effort: No tachypnea, accessory muscle usage or respiratory distress.     Breath sounds: Normal breath sounds. No decreased breath sounds or wheezing.  Chest:  Breasts:    Right: No inverted nipple, mass, nipple discharge or tenderness (no axillary adenopathy).     Left: No inverted nipple, mass, nipple discharge or tenderness (no axilarry adenopathy).  Abdominal:     General: Bowel sounds are normal.     Palpations: Abdomen is soft.     Tenderness: There is no abdominal tenderness.  Musculoskeletal:        General: No swelling or tenderness.     Cervical back: Neck supple.  Lymphadenopathy:     Cervical: No cervical adenopathy.  Skin:    Findings: No erythema or rash.  Neurological:     Mental Status: She is alert and oriented to person, place, and time.  Psychiatric:        Mood and Affect: Mood normal.        Behavior: Behavior normal.      Outpatient Encounter Medications as of 09/16/2022  Medication Sig   ALPRAZolam (XANAX) 0.25 MG tablet Take 1 tablet (0.25 mg total) by mouth daily as needed for sleep.   ascorbic acid (VITAMIN C) 500 MG tablet Take 500 mg by mouth daily.   ASHWAGANDHA PO    cyanocobalamin 2000 MCG tablet Take 2,000 mcg by mouth daily.   MAGNESIUM GLUCONATE PO Take by mouth.   metaxalone (SKELAXIN) 800 MG tablet TAKE  1/2 TO 1 TABLET BY MOUTH AT BEDTIME AS NEEDED FOR BACK SPASMS (Patient not taking: Reported on 06/04/2022)   Multiple Vitamin (MULTIVITAMIN) capsule Take 1 capsule by mouth daily.   Vitamin D, Ergocalciferol, (DRISDOL) 50000 units CAPS capsule Take 1  capsule (50,000 Units total) by mouth every 7 (seven) days.   vitamin E 200 UNIT capsule Take 200 Units by mouth daily.   No facility-administered encounter medications on file as of 09/16/2022.     Lab Results  Component Value Date   WBC 4.0 05/29/2022   HGB 13.2 05/29/2022   HCT 41.0 05/29/2022   PLT 256 05/29/2022   GLUCOSE 85 05/14/2022   CHOL 224 (H) 05/14/2022   TRIG 40.0 05/14/2022   HDL 125.20 05/14/2022   LDLCALC 91 05/14/2022   ALT 34 05/14/2022   AST 32 05/14/2022   NA 139 05/14/2022   K 4.4 05/14/2022   CL 101 05/14/2022   CREATININE 0.71 05/14/2022   BUN 12 05/14/2022   CO2 28 05/14/2022   TSH 1.67 05/14/2022   INR 1.1 05/29/2022   HGBA1C 5.4 11/02/2015    MM 3D SCREENING MAMMOGRAM BILATERAL BREAST  Result Date: 08/21/2022 CLINICAL DATA:  Screening. EXAM: DIGITAL SCREENING BILATERAL MAMMOGRAM WITH TOMOSYNTHESIS AND CAD TECHNIQUE: Bilateral screening digital craniocaudal and mediolateral oblique mammograms were obtained. Bilateral screening digital breast tomosynthesis was performed. The images were evaluated with computer-aided detection. COMPARISON:  Previous exam(s). ACR Breast Density Category d: The breasts are extremely dense, which lowers the sensitivity of mammography. FINDINGS: There are no findings suspicious for malignancy. IMPRESSION: No mammographic evidence of malignancy. A result letter of this screening mammogram will be mailed directly to the patient. RECOMMENDATION: Screening mammogram in one year. (Code:SM-B-01Y) BI-RADS CATEGORY  1: Negative. Electronically Signed   By: Edwin Cap M.D.   On: 08/21/2022 09:25       Assessment & Plan:  Routine general medical examination at a health care  facility  Screening cholesterol level -     Lipid panel  Leukopenia, unspecified type Assessment & Plan: Followed by hematology. Has decreased alcohol intake.  Desires to have cbc recheck today.    Orders: -     CBC with Differential/Platelet -     Basic metabolic panel -     Hepatic function panel  Health care maintenance Assessment & Plan: Physical - 09/16/22.  PAP 02/12/21 - negative with negative HPV.  Mammogram 08/20/22 - Bir[facility] I. Colonoscopy 11/2021 - Hemorrhoids found on perianal exam. Three 3 to 6 mm polyps in the descending colon, in the proximal transverse colon and in the mid transverse colon. Internal hemorrhoids. The examination was otherwise normal. Pathology - Tubular adenoma, negative for high grade dysplasia. 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   Uncomplicated alcohol dependence (HCC) Assessment & Plan: She has cut down on her alcohol intake.  Doing better.  Follow.    Anemia, unspecified type Assessment & Plan: EGD 1 - 11/2021 - The histologic findings are compatible with Candida esophagitis.   Colonoscopy 11/2021 - Tubular adenoma, negative for high grade dysplasia. 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 Follow cbc. Desires recheck today.    Barrett's esophagus without dysplasia Assessment & Plan: 11/2022- EGD - no evidence of Barrett's esophagus, LA Grade A esophagitis, one superficial esophageal ulcer with no bleeding in lower third of esophagus, esophageal candidiasis, normal stomach and duodenum    Stress Assessment & Plan: Overall appears to be handling things well.       Dale [place], MD

## 2022-09-16 NOTE — Assessment & Plan Note (Signed)
Overall appears to be handling things well.   

## 2022-09-16 NOTE — Assessment & Plan Note (Signed)
She has cut down on her alcohol intake.  Doing better.  Follow.

## 2022-09-16 NOTE — Assessment & Plan Note (Signed)
Followed by hematology. Has decreased alcohol intake.  Desires to have cbc recheck today.

## 2022-09-16 NOTE — Assessment & Plan Note (Signed)
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   Colonoscopy 11/2021 - Tubular adenoma, negative for high grade dysplasia. 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 Follow cbc. Desires recheck today.

## 2022-11-28 ENCOUNTER — Other Ambulatory Visit: Payer: Self-pay

## 2022-11-28 ENCOUNTER — Inpatient Hospital Stay: Payer: BC Managed Care – PPO | Attending: Oncology

## 2022-11-28 DIAGNOSIS — D72819 Decreased white blood cell count, unspecified: Secondary | ICD-10-CM | POA: Insufficient documentation

## 2022-11-28 DIAGNOSIS — D708 Other neutropenia: Secondary | ICD-10-CM

## 2022-11-28 LAB — CBC WITH DIFFERENTIAL (CANCER CENTER ONLY)
Abs Immature Granulocytes: 0.01 10*3/uL (ref 0.00–0.07)
Basophils Absolute: 0.1 10*3/uL (ref 0.0–0.1)
Basophils Relative: 2 %
Eosinophils Absolute: 0.1 10*3/uL (ref 0.0–0.5)
Eosinophils Relative: 2 %
HCT: 38.6 % (ref 36.0–46.0)
Hemoglobin: 12.9 g/dL (ref 12.0–15.0)
Immature Granulocytes: 0 %
Lymphocytes Relative: 28 %
Lymphs Abs: 0.9 10*3/uL (ref 0.7–4.0)
MCH: 29.9 pg (ref 26.0–34.0)
MCHC: 33.4 g/dL (ref 30.0–36.0)
MCV: 89.4 fL (ref 80.0–100.0)
Monocytes Absolute: 0.3 10*3/uL (ref 0.1–1.0)
Monocytes Relative: 10 %
Neutro Abs: 1.9 10*3/uL (ref 1.7–7.7)
Neutrophils Relative %: 58 %
Platelet Count: 260 10*3/uL (ref 150–400)
RBC: 4.32 MIL/uL (ref 3.87–5.11)
RDW: 12.9 % (ref 11.5–15.5)
WBC Count: 3.2 10*3/uL — ABNORMAL LOW (ref 4.0–10.5)
nRBC: 0 % (ref 0.0–0.2)

## 2022-11-28 LAB — VITAMIN B12: Vitamin B-12: 2479 pg/mL — ABNORMAL HIGH (ref 180–914)

## 2022-12-03 LAB — COMP PANEL: LEUKEMIA/LYMPHOMA: Immunophenotypic Profile: 0

## 2022-12-26 ENCOUNTER — Other Ambulatory Visit: Payer: BC Managed Care – PPO

## 2022-12-26 ENCOUNTER — Ambulatory Visit: Payer: BC Managed Care – PPO

## 2023-01-02 ENCOUNTER — Inpatient Hospital Stay: Payer: BC Managed Care – PPO | Attending: Oncology | Admitting: Oncology

## 2023-01-02 ENCOUNTER — Encounter: Payer: Self-pay | Admitting: Oncology

## 2023-01-02 VITALS — BP 123/78 | HR 76 | Temp 97.1°F | Resp 18 | Wt 148.1 lb

## 2023-01-02 DIAGNOSIS — Z79624 Long term (current) use of inhibitors of nucleotide synthesis: Secondary | ICD-10-CM | POA: Diagnosis not present

## 2023-01-02 DIAGNOSIS — D72819 Decreased white blood cell count, unspecified: Secondary | ICD-10-CM | POA: Insufficient documentation

## 2023-01-02 DIAGNOSIS — D72820 Lymphocytosis (symptomatic): Secondary | ICD-10-CM | POA: Diagnosis not present

## 2023-01-02 DIAGNOSIS — Z789 Other specified health status: Secondary | ICD-10-CM | POA: Diagnosis not present

## 2023-01-02 NOTE — Progress Notes (Signed)
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 No constitutional symptoms.  Recommend observation.   Leukopenia Labs are reviewed and discussed with patient. Wbc is stable, improved.  normal CBC with differential; normal  LDH; normal folate and Vitamin B12, negative hepatitis, negative HIV, negative EBV, CMV, ANCA, ANA.   Possible due to alcohol use Observation.   Alcohol use Recommend alcohol cessation.  B12 level has improved. Recommend decreased B12 supplementation daily to  1-2 times per week.    Orders Placed This Encounter  Procedures   CBC with Differential (Cancer Center Only)    Standing Status:   Future    Expected Date:   07/03/2023    Expiration Date:   01/02/2024   CMP (Cancer Center only)    Standing Status:   Future    Expected Date:   07/03/2023    Expiration Date:   01/02/2024   Vitamin B12    Standing Status:   Future    Expected Date:   07/03/2023    Expiration Date:   01/02/2024   Follow up in 6 months.  All questions were answered. The patient knows to call the clinic with any problems, questions or concerns.  Adrienne Patience, MD, PhD [facility] Hematology Oncology 01/02/2023    HISTORY OF PRESENTING ILLNESS:  Adrienne Phillips is a 52 y.o. female who was seen in consultation at the request of Dale [place], MD for evaluation of  leukopenia Reviewed patient's recent labs. Patient has low total WBC count was 2.4 on 05/14/2022,, predominantly neutropenia with ANC of 1.3. Previous lab records reviewed. Leukopenia duration is chronic onset, duration is since at least 2022.  Patient denies fatigue, weight loss, fever, chills, frequent infection.  Denies history hepatitis or HIV infection Denies history of chronic liver disease Patient drinks 2-3 bottles of wine per week.  Mostly on weekends. Vitamin B12 supplementation is on her medication list, she is not currently taking. 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  Recently she has had a CRP checked which were normal.  Denies any skin rash.  She has a family history of autoimmune disease.  INTERVAL HISTORY Adrienne Phillips is a 52 y.o. female who has above history reviewed by me today presents for follow up visit for leukopenia.  She just came back from vacation. Has cut back alcohol consumption. She drinks 1 bottle of wine per week. She take b12 supplementations along with other otc vitamins.  No new complaints.   MEDICAL HISTORY:  Past Medical History:  Diagnosis Date   Anemia    BRCA negative 02/2021   MyRisk neg except AXIN2 VUS   Family history of breast cancer    Hypoglycemia    Increased risk of breast cancer 02/2021   IBIS=35.4%/riskscore=26.4%   Iron deficiency    Migraine    Ovarian cyst     SURGICAL HISTORY: Past Surgical History:  Procedure Laterality Date   BREAST CYST ASPIRATION Left 1998  BREAST CYST ASPIRATION Left 09/06/2020   1:00 FNA    SOCIAL HISTORY: Social History   Socioeconomic History   Marital status: Married    Spouse name: Not on file   Number of children: 0   Years of education: Not on file   Highest education level: Not on file  Occupational History    Employer: [employer]  Tobacco Use   Smoking status: Never   Smokeless tobacco: Never  Vaping Use   Vaping  status: Never Used  Substance and Sexual Activity   Alcohol use: Yes    Comment: 2-3 bottles wine per week.   Drug use: No   Sexual activity: Yes  Other Topics Concern   Not on file  Social History Narrative   Not on file   Social Drivers of Health   Financial Resource Strain: Low Risk  (05/29/2022)   Overall Financial Resource Strain (CARDIA)    Difficulty of Paying Living Expenses: Not very hard  Food Insecurity: No Food Insecurity (05/29/2022)   Hunger Vital Sign    Worried About Running Out of Food in the Last Year: Never true    Ran Out of Food in the Last Year: Never true  Transportation Needs: No Transportation Needs (05/29/2022)   PRAPARE - Administrator, Civil Service (Medical): No    Lack of Transportation (Non-Medical): No  Physical Activity: Not on file  Stress: No Stress Concern Present (05/29/2022)   [employer] of Occupational Health - Occupational Stress Questionnaire    Feeling of Stress : Not at all  Social Connections: Not on file  Intimate Partner Violence: Not At Risk (05/29/2022)   Humiliation, Afraid, Rape, and Kick questionnaire    Fear of Current or Ex-Partner: No    Emotionally Abused: No    Physically Abused: No    Sexually Abused: No    FAMILY HISTORY: Family History  Problem Relation Age of Onset   Hypertension Mother        migraines   Colon cancer Father        43s   Skin cancer Father    Breast cancer Sister 51   Rheum arthritis Brother    Osteoporosis Paternal Grandmother    Ulcers Paternal Grandfather    Breast cancer Maternal Aunt        36   Breast cancer Paternal Aunt 3    ALLERGIES:  is allergic to codeine and topamax [topiramate].  MEDICATIONS:  Current Outpatient Medications  Medication Sig Dispense Refill   ALPRAZolam (XANAX) 0.25 MG tablet Take 1 tablet (0.25 mg total) by mouth daily as needed for sleep. 15 tablet 0   ascorbic acid (VITAMIN C) 500 MG tablet Take 500 mg by mouth daily.     ASHWAGANDHA PO       cyanocobalamin 2000 MCG tablet Take 2,000 mcg by mouth daily.     MAGNESIUM GLUCONATE PO Take by mouth.     Multiple Vitamin (MULTIVITAMIN) capsule Take 1 capsule by mouth daily.     valACYclovir (VALTREX) 1000 MG tablet Take 1,000 mg by mouth daily.     Vitamin D, Ergocalciferol, (DRISDOL) 50000 units CAPS capsule Take 1 capsule (50,000 Units total) by mouth every 7 (seven) days. 8 capsule 0   vitamin E 200 UNIT capsule Take 200 Units by mouth daily.     metaxalone (SKELAXIN) 800 MG tablet TAKE 1/2 TO 1 TABLET BY MOUTH AT BEDTIME AS NEEDED FOR BACK SPASMS (Patient not taking: Reported on 01/02/2023) 30 tablet 0  No current facility-administered medications for this visit.     Review of Systems  Constitutional:  Negative for appetite change, chills, fatigue and fever.  HENT:   Negative for hearing loss and voice change.   Eyes:  Negative for eye problems.  Respiratory:  Negative for chest tightness and cough.   Cardiovascular:  Negative for chest pain.  Gastrointestinal:  Negative for abdominal distention, abdominal pain and blood in stool.  Endocrine: Negative for hot flashes.  Genitourinary:  Negative for difficulty urinating and frequency.   Musculoskeletal:  Positive for arthralgias.  Skin:  Negative for itching and rash.  Neurological:  Negative for extremity weakness.  Hematological:  Negative for adenopathy.  Psychiatric/Behavioral:  Negative for confusion.     PHYSICAL EXAMINATION: ECOG PERFORMANCE STATUS: 0 - Asymptomatic Vitals:   01/02/23 1208  BP: 123/78  Pulse: 76  Resp: 18  Temp: (!) 97.1 F (36.2 C)  SpO2: 100%   Filed Weights   01/02/23 1208  Weight: 148 lb 1.6 oz (67.2 kg)     Physical Exam Constitutional:      General: She is not in acute distress. HENT:     Head: Normocephalic and atraumatic.  Eyes:     General: No scleral icterus. Cardiovascular:     Rate and Rhythm: Normal rate.  Pulmonary:     Effort: Pulmonary effort is normal. No  respiratory distress.  Abdominal:     General: There is no distension.  Musculoskeletal:        General: Normal range of motion.     Cervical back: Normal range of motion and neck supple.  Skin:    Findings: No erythema or rash.  Neurological:     Mental Status: She is alert and oriented to person, place, and time. Mental status is at baseline.     Cranial Nerves: No cranial nerve deficit.  Psychiatric:        Mood and Affect: Mood normal.     RADIOGRAPHIC STUDIES: I have personally reviewed the radiological images as listed and agreed with the findings in the report. No results found.   LABORATORY DATA:  I have reviewed the data as listed    Latest Ref Rng & Units 11/28/2022    3:24 PM 09/16/2022    7:37 AM 05/29/2022   12:02 PM  CBC  WBC 4.0 - 10.5 K/uL 3.2  2.7  4.0   Hemoglobin 12.0 - 15.0 g/dL 13.0  86.5  78.4   Hematocrit 36.0 - 46.0 % 38.6  37.0  41.0   Platelets 150 - 400 K/uL 260  244.0  256       Latest Ref Rng & Units 09/16/2022    7:37 AM 05/14/2022    7:46 AM 08/29/2021    2:21 PM  CMP  Glucose 70 - 99 mg/dL 96  85  71   BUN 6 - 23 mg/dL 14  12  13    Creatinine 0.40 - 1.20 mg/dL 6.96  2.95  2.84   Sodium 135 - 145 mEq/L 137  139  138   Potassium 3.5 - 5.1 mEq/L 4.5  4.4  4.5   Chloride 96 - 112 mEq/L 100  101  101   CO2 19 - 32 mEq/L 29  28  30    Calcium 8.4 - 10.5 mg/dL 9.1  9.4  9.0   Total Protein 6.0 - 8.3 g/dL 6.9  7.3  6.9   Total Bilirubin 0.2 - 1.2 mg/dL 0.4  0.4  0.4   Alkaline  Phos 39 - 117 U/L 83  82  65   AST 0 - 37 U/L 25  32  19   ALT 0 - 35 U/L 29  34  14        RADIOGRAPHIC STUDIES: I have personally reviewed the radiological images as listed and agreed with the findings in the report. No results found.

## 2023-01-02 NOTE — Assessment & Plan Note (Addendum)
Labs are reviewed and discussed with patient. Wbc is stable, improved.  normal CBC with differential; normal  LDH; normal folate and Vitamin B12, negative hepatitis, negative HIV, negative EBV, CMV, ANCA, ANA.   Possible due to alcohol use Observation.

## 2023-01-02 NOTE — Assessment & Plan Note (Addendum)
Recommend alcohol cessation.  B12 level has improved. Recommend decreased B12 supplementation daily to  1-2 times per week.

## 2023-01-02 NOTE — Assessment & Plan Note (Addendum)
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 No constitutional symptoms.  Recommend observation.

## 2023-03-19 ENCOUNTER — Ambulatory Visit: Payer: 59 | Admitting: Internal Medicine

## 2023-03-19 VITALS — BP 118/74 | HR 72 | Temp 97.9°F | Resp 16 | Ht 67.0 in | Wt 152.8 lb

## 2023-03-19 DIAGNOSIS — R350 Frequency of micturition: Secondary | ICD-10-CM | POA: Diagnosis not present

## 2023-03-19 DIAGNOSIS — Z87898 Personal history of other specified conditions: Secondary | ICD-10-CM

## 2023-03-19 DIAGNOSIS — M25559 Pain in unspecified hip: Secondary | ICD-10-CM

## 2023-03-19 DIAGNOSIS — F439 Reaction to severe stress, unspecified: Secondary | ICD-10-CM | POA: Diagnosis not present

## 2023-03-19 DIAGNOSIS — Z1239 Encounter for other screening for malignant neoplasm of breast: Secondary | ICD-10-CM

## 2023-03-19 DIAGNOSIS — D72820 Lymphocytosis (symptomatic): Secondary | ICD-10-CM | POA: Diagnosis not present

## 2023-03-19 DIAGNOSIS — M79641 Pain in right hand: Secondary | ICD-10-CM

## 2023-03-19 LAB — URINALYSIS, ROUTINE W REFLEX MICROSCOPIC
Bilirubin Urine: NEGATIVE
Hgb urine dipstick: NEGATIVE
Ketones, ur: NEGATIVE
Leukocytes,Ua: NEGATIVE
Nitrite: NEGATIVE
RBC / HPF: NONE SEEN (ref 0–?)
Specific Gravity, Urine: <=1.005 — AB (ref 1.000–1.030)
Total Protein, Urine: NEGATIVE
Urine Glucose: NEGATIVE
Urobilinogen, UA: 0.2 (ref 0.0–1.0)
WBC, UA: NONE SEEN (ref 0–?)
pH: 6.5 (ref 5.0–8.0)

## 2023-03-19 NOTE — Progress Notes (Signed)
 Subjective:    Patient ID: Adrienne Phillips, female    DOB: [birth year], 53 y.o.   MRN: [id number]  Patient here for  Chief Complaint  Patient presents with   Medical Management of Chronic Issues    HPI Here for a scheduled follow up - f/u regarding anemia and increased stress. S/p EGD and colonoscopy 11/2021 - three 3-6 mm polyps in the descending colon (tubular adenoma and sessile serrated adenoma) and internal hemorrhoids. EGD - LA Grade A reflux esophagitis, esophageal ulcer, white nummular lesions in the esophageal mucosa. ([place]).  Sees Dr Cathie Hoops for f/u leukopenia and monoclonal B-cell lymphocytosis of undetermined significance. Recommended continued observation. She continues to stay active. Exercising. Has been seeing chiropractor - hip pain.  Discussed PT referral.  Agreeable. Also, right hand issues. Getting harder to use - open jar, etc. Has noticed some increased urinary frequency. No vaginal itching. Increased stress - work. Does not feel needs any further intervention. Discussed breast cancer screening - given high risk.    Past Medical History:  Diagnosis Date   Anemia    BRCA negative 02/2021   MyRisk neg except AXIN2 VUS   Family history of breast cancer    Hypoglycemia    Increased risk of breast cancer 02/2021   IBIS=35.4%/riskscore=26.4%   Iron deficiency    Migraine    Ovarian cyst    Past Surgical History:  Procedure Laterality Date   BREAST CYST ASPIRATION Left 1998   BREAST CYST ASPIRATION Left 09/06/2020   1:00 FNA   Family History  Problem Relation Age of Onset   Hypertension Mother        migraines   Colon cancer Father        60s   Skin cancer Father    Breast cancer Sister 39   Rheum arthritis Brother    Osteoporosis Paternal Grandmother    Ulcers Paternal Grandfather    Breast cancer Maternal Aunt        63   Breast cancer Paternal Aunt 85   Social History   Socioeconomic History   Marital status: Married    Spouse name: Not on file    Number of children: 0   Years of education: Not on file   Highest education level: Bachelor's degree (e.g., BA, AB, BS)  Occupational History    Employer: [employer]  Tobacco Use   Smoking status: Never   Smokeless tobacco: Never  Vaping Use   Vaping status: Never Used  Substance and Sexual Activity   Alcohol use: Yes    Comment: 2-3 bottles wine per week.   Drug use: No   Sexual activity: Yes  Other Topics Concern   Not on file  Social History Narrative   Not on file   Social Drivers of Health   Financial Resource Strain: Low Risk  (03/18/2023)   Overall Financial Resource Strain (CARDIA)    Difficulty of Paying Living Expenses: Not hard at all  Food Insecurity: No Food Insecurity (03/18/2023)   Hunger Vital Sign    Worried About Running Out of Food in the Last Year: Never true    Ran Out of Food in the Last Year: Never true  Transportation Needs: No Transportation Needs (03/18/2023)   PRAPARE - Administrator, Civil Service (Medical): No    Lack of Transportation (Non-Medical): No  Physical Activity: Sufficiently Active (03/18/2023)   Exercise Vital Sign    Days of Exercise per Week: 6 days    Minutes of  Exercise per Session: 60 min  Stress: Stress Concern Present (03/18/2023)   [employer] of Occupational Health - Occupational Stress Questionnaire    Feeling of Stress : To some extent  Social Connections: Socially Integrated (03/18/2023)   Social Connection and Isolation Panel [NHANES]    Frequency of Communication with Friends and Family: More than three times a week    Frequency of Social Gatherings with Friends and Family: Three times a week    Attends Religious Services: More than 4 times per year    Active Member of Clubs or Organizations: Yes    Attends Banker Meetings: More than 4 times per year    Marital Status: Married     Review of Systems  Constitutional:  Negative for appetite change and unexpected weight change.  HENT:   Negative for congestion and sinus pressure.   Respiratory:  Negative for cough, chest tightness and shortness of breath.   Cardiovascular:  Negative for chest pain, palpitations and leg swelling.  Gastrointestinal:  Negative for abdominal pain, diarrhea, nausea and vomiting.  Genitourinary:  Positive for frequency. Negative for difficulty urinating.  Musculoskeletal:  Negative for myalgias.       Hand issues as outlined. Also hip pain.   Skin:  Negative for color change and rash.  Neurological:  Negative for dizziness and headaches.  Psychiatric/Behavioral:  Negative for agitation and dysphoric mood.        Increased stress.        Objective:     BP 118/74   Pulse 72   Temp 97.9 F (36.6 C)   Resp 16   Ht 5\' 7"  (1.702 m)   Wt 152 lb 12.8 oz (69.3 kg)   SpO2 99%   BMI 23.93 kg/m  Wt Readings from Last 3 Encounters:  03/19/23 152 lb 12.8 oz (69.3 kg)  01/02/23 148 lb 1.6 oz (67.2 kg)  09/16/22 143 lb 3.2 oz (65 kg)    Physical Exam Vitals reviewed.  Constitutional:      General: She is not in acute distress.    Appearance: Normal appearance.  HENT:     Head: Normocephalic and atraumatic.     Right Ear: External ear normal.     Left Ear: External ear normal.     Mouth/Throat:     Pharynx: No oropharyngeal exudate or posterior oropharyngeal erythema.  Eyes:     General: No scleral icterus.       Right eye: No discharge.        Left eye: No discharge.     Conjunctiva/sclera: Conjunctivae normal.  Neck:     Thyroid: No thyromegaly.  Cardiovascular:     Rate and Rhythm: Normal rate and regular rhythm.  Pulmonary:     Effort: No respiratory distress.     Breath sounds: Normal breath sounds. No wheezing.  Abdominal:     General: Bowel sounds are normal.     Palpations: Abdomen is soft.     Tenderness: There is no abdominal tenderness.  Musculoskeletal:        General: No swelling or tenderness.     Cervical back: Neck supple. No tenderness.  Lymphadenopathy:      Cervical: No cervical adenopathy.  Skin:    Findings: No erythema or rash.  Neurological:     Mental Status: She is alert.  Psychiatric:        Mood and Affect: Mood normal.        Behavior: Behavior normal.  Outpatient Encounter Medications as of 03/19/2023  Medication Sig   ALPRAZolam (XANAX) 0.25 MG tablet Take 1 tablet (0.25 mg total) by mouth daily as needed for sleep.   ASHWAGANDHA PO    MAGNESIUM GLUCONATE PO Take by mouth.   metaxalone (SKELAXIN) 800 MG tablet TAKE 1/2 TO 1 TABLET BY MOUTH AT BEDTIME AS NEEDED FOR BACK SPASMS (Patient not taking: Reported on 01/02/2023)   Multiple Vitamin (MULTIVITAMIN) capsule Take 1 capsule by mouth daily.   [DISCONTINUED] ascorbic acid (VITAMIN C) 500 MG tablet Take 500 mg by mouth daily.   [DISCONTINUED] cyanocobalamin 2000 MCG tablet Take 2,000 mcg by mouth daily.   [DISCONTINUED] valACYclovir (VALTREX) 1000 MG tablet Take 1,000 mg by mouth daily.   [DISCONTINUED] Vitamin D, Ergocalciferol, (DRISDOL) 50000 units CAPS capsule Take 1 capsule (50,000 Units total) by mouth every 7 (seven) days.   [DISCONTINUED] vitamin E 200 UNIT capsule Take 200 Units by mouth daily.   No facility-administered encounter medications on file as of 03/19/2023.     Lab Results  Component Value Date   WBC 3.2 (L) 11/28/2022   HGB 12.9 11/28/2022   HCT 38.6 11/28/2022   PLT 260 11/28/2022   GLUCOSE 96 09/16/2022   CHOL 214 (H) 09/16/2022   TRIG 48.0 09/16/2022   HDL 96.50 09/16/2022   LDLCALC 108 (H) 09/16/2022   ALT 29 09/16/2022   AST 25 09/16/2022   NA 137 09/16/2022   K 4.5 09/16/2022   CL 100 09/16/2022   CREATININE 0.71 09/16/2022   BUN 14 09/16/2022   CO2 29 09/16/2022   TSH 1.67 05/14/2022   INR 1.1 05/29/2022   HGBA1C 5.4 11/02/2015    MM 3D SCREENING MAMMOGRAM BILATERAL BREAST Result Date: 08/21/2022 CLINICAL DATA:  Screening. EXAM: DIGITAL SCREENING BILATERAL MAMMOGRAM WITH TOMOSYNTHESIS AND CAD TECHNIQUE: Bilateral screening  digital craniocaudal and mediolateral oblique mammograms were obtained. Bilateral screening digital breast tomosynthesis was performed. The images were evaluated with computer-aided detection. COMPARISON:  Previous exam(s). ACR Breast Density Category d: The breasts are extremely dense, which lowers the sensitivity of mammography. FINDINGS: There are no findings suspicious for malignancy. IMPRESSION: No mammographic evidence of malignancy. A result letter of this screening mammogram will be mailed directly to the patient. RECOMMENDATION: Screening mammogram in one year. (Code:SM-B-01Y) BI-RADS CATEGORY  1: Negative. Electronically Signed   By: Edwin Cap M.D.   On: 08/21/2022 09:25       Assessment & Plan:  Urinary frequency -     Urinalysis, Routine w reflex microscopic -     Urine Culture  Breast cancer screening, high risk patient -     MR BREAST BILATERAL W WO CONTRAST INC CAD; Future  Stress Assessment & Plan: Increased work stress. Discussed. Overall appears to be handling things relatively well. Follow.   Monoclonal B-cell lymphocytosis of undetermined significance Assessment & Plan: Sees Dr Cathie Hoops for f/u leukopenia and monoclonal B-cell lymphocytosis of undetermined significance. Recommended continued observation.    History of genetic counseling Assessment & Plan: Given her increased risk of breast cancer, she qualifies for yearly mammograms and screening breast MRIs for better breast imaging, per NCCN guidelines. The studies are staggered every 6 months so there is twice yearly breast screening in hopes of catching anything early. Discussed with her today. Agreeable for breast MRI.    Pain of right hand Assessment & Plan: Increased pain/discomfort. Affecting grip, etc. Refer to OT for further evaluation.   Orders: -     Ambulatory referral to Occupational Therapy  Hip pain, unspecified laterality Assessment & Plan: Persistent. Has gone to chiropractor. Tweaked left  knee as well. Discussed further evaluation. Agreeable for referral to PT  Orders: -     Ambulatory referral to Physical Therapy     Dale Houston, MD

## 2023-03-20 ENCOUNTER — Encounter: Payer: Self-pay | Admitting: Internal Medicine

## 2023-03-20 LAB — URINE CULTURE
MICRO NUMBER:: 16138053
Result:: NO GROWTH
SPECIMEN QUALITY:: ADEQUATE

## 2023-03-23 ENCOUNTER — Encounter: Payer: Self-pay | Admitting: Internal Medicine

## 2023-03-23 DIAGNOSIS — M25559 Pain in unspecified hip: Secondary | ICD-10-CM | POA: Insufficient documentation

## 2023-03-23 DIAGNOSIS — M79643 Pain in unspecified hand: Secondary | ICD-10-CM | POA: Insufficient documentation

## 2023-03-23 NOTE — Assessment & Plan Note (Signed)
 Increased pain/discomfort. Affecting grip, etc. Refer to OT for further evaluation.

## 2023-03-23 NOTE — Assessment & Plan Note (Signed)
 Persistent. Has gone to chiropractor. Tweaked left knee as well. Discussed further evaluation. Agreeable for referral to PT

## 2023-03-23 NOTE — Assessment & Plan Note (Signed)
 Increased work stress. Discussed. Overall appears to be handling things relatively well. Follow.

## 2023-03-23 NOTE — Assessment & Plan Note (Signed)
 Given her increased risk of breast cancer, she qualifies for yearly mammograms and screening breast MRIs for better breast imaging, per NCCN guidelines. The studies are staggered every 6 months so there is twice yearly breast screening in hopes of catching anything early. Discussed with her today. Agreeable for breast MRI.

## 2023-03-23 NOTE — Assessment & Plan Note (Signed)
 Sees Dr Cathie Hoops for f/u leukopenia and monoclonal B-cell lymphocytosis of undetermined significance. Recommended continued observation.

## 2023-03-30 ENCOUNTER — Encounter: Payer: Self-pay | Admitting: Internal Medicine

## 2023-03-31 NOTE — Telephone Encounter (Signed)
 Noted.

## 2023-03-31 NOTE — Telephone Encounter (Signed)
 Patient following up on breast MRI and PT/OT referrals placed 3/3

## 2023-04-29 ENCOUNTER — Ambulatory Visit: Admitting: Physical Therapy

## 2023-05-11 ENCOUNTER — Ambulatory Visit: Attending: Internal Medicine | Admitting: Occupational Therapy

## 2023-05-11 ENCOUNTER — Encounter: Payer: Self-pay | Admitting: Occupational Therapy

## 2023-05-11 DIAGNOSIS — M79641 Pain in right hand: Secondary | ICD-10-CM | POA: Insufficient documentation

## 2023-05-11 DIAGNOSIS — M25631 Stiffness of right wrist, not elsewhere classified: Secondary | ICD-10-CM | POA: Insufficient documentation

## 2023-05-11 DIAGNOSIS — M6281 Muscle weakness (generalized): Secondary | ICD-10-CM | POA: Diagnosis present

## 2023-05-11 NOTE — Therapy (Signed)
 OUTPATIENT OCCUPATIONAL THERAPY ORTHO EVALUATION  Patient Name: Adrienne Phillips MRN: [id number] DOB:1970-07-22, 53 y.o., female Today's Date: 05/11/2023  PCP: Dr Geralyn Knee REFERRING PROVIDER: Dr Geralyn Knee  END OF SESSION:  OT End of Session - 05/11/23 0904     Visit Number 1    Number of Visits 6    Date for OT Re-Evaluation 08/03/23    OT Start Time 0904    OT Stop Time 0951    OT Time Calculation (min) 47 min    Activity Tolerance Patient tolerated treatment well    Behavior During Therapy [facility] for tasks assessed/performed             Past Medical History:  Diagnosis Date   Anemia    BRCA negative 02/2021   MyRisk neg except AXIN2 VUS   Family history of breast cancer    Hypoglycemia    Increased risk of breast cancer 02/2021   IBIS=35.4%/riskscore=26.4%   Iron deficiency    Migraine    Ovarian cyst    Past Surgical History:  Procedure Laterality Date   BREAST CYST ASPIRATION Left 1998   BREAST CYST ASPIRATION Left 09/06/2020   1:00 FNA   Patient Active Problem List   Diagnosis Date Noted   Hand pain 03/23/2023   Hip pain 03/23/2023   Breast cancer screening, high risk patient 03/19/2023   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   Alcohol use 05/29/2022   Joint pain 05/14/2022   History of genetic counseling 07/03/2021   Dysphagia 05/11/2021   Family history of breast cancer 02/12/2021   Persistent cough 01/29/2021   Family history of aneurysm 10/28/2020   Vaginal discharge 02/20/2020   Left sided abdominal pain 02/15/2020   Weight gain 02/15/2020   Vaccine counseling 09/18/2019   Hearing loss 09/18/2019   Headache 07/07/2019   Breast tenderness 07/03/2019   Irregular periods 09/13/2018   Facial droop 04/10/2017   Numbness and tingling of right arm and leg 04/10/2017   Vaginal lesion 12/21/2016   Sinusitis 10/10/2016   Stress 07/18/2016   History of abnormal cervical Pap smear 01/06/2016   Chest pain 06/30/2015    Left arm numbness 06/27/2015   Neck pain 06/27/2015   Bad odor of urine 11/13/2014   Back pain 11/08/2014   History of colonic polyps 03/19/2014   Health care maintenance 03/19/2014   Congestion of nasal sinus 03/19/2014   Barrett's esophagus 03/20/2013   Anemia 02/15/2012   Leukopenia 02/15/2012    ONSET DATE: 3 -4 yrs ago  REFERRING DIAG: R hand pain and weakness  THERAPY DIAG: R wrist stiffness , muscle weakness, R hand pain    Rationale for Evaluation and Treatment: Rehabilitation  SUBJECTIVE:   SUBJECTIVE STATEMENT: I fractured my R hand about 3 to 4 years ago.  And since then just my grip is weak.  Feels like him dropping things.  And then at the end of the day just is hurting and swelling over the top of my hand.  My grip feels weak like carrying things lifting things opening things. Pt accompanied by: self  PERTINENT HISTORY: Dr Geralyn Knee note Pain of right hand 03/19/23 Assessment & Plan: Increased pain/discomfort. Affecting grip, etc. Refer to OT for further evaluation  PRECAUTIONS: None     WEIGHT BEARING RESTRICTIONS: No  PAIN:  Are you having pain? 2/10 end of day dorsal hand - with increase edema   FALLS: Has patient fallen in last 6 months? 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  60 hours a week.  She likes to clean and organize the house.  Help other people.[name] work.  Has a dog.  Do crafts.  Making bread.  Do workout do weights at home  PATIENT GOALS: I just want the pain better and my strength in my right hand  NEXT MD VISIT: ?  OBJECTIVE:  Note: Objective measures were completed at Evaluation unless otherwise noted.  HAND DOMINANCE: Right  ADLs: Pain and discomfort with planks, push-ups , workout with weights, working the yard ,cleaning ,cooking ,work on the computer  FUNCTIONAL OUTCOME MEASURES: Assess next session  UPPER EXTREMITY ROM:      Active ROM Right eval Left eval  Shoulder flexion    Shoulder abduction    Shoulder adduction    Shoulder extension    Shoulder internal rotation    Shoulder external rotation    Elbow flexion    Elbow extension    Wrist flexion 90   Wrist extension 60 4/5 strength   Wrist ulnar deviation 30   Wrist radial deviation 20   Wrist pronation 4/5   Wrist supination 4/5   (Blank rows = not tested)  Active ROM Right eval Left eval  Thumb MCP (0-60)    Thumb IP (0-80)    Thumb Radial abd/add (0-55)     Thumb Palmar abd/add (0-45)     Thumb Opposition to Small Finger     Index MCP (0-90)     Index PIP (0-100)     Index DIP (0-70)      Long MCP (0-90)      Long PIP (0-100)      Long DIP (0-70)      Ring MCP (0-90)      Ring PIP (0-100)      Ring DIP (0-70)      Little MCP (0-90)      Little PIP (0-100)      Little DIP (0-70)      (Active range of motion within functional limits in bilateral hands.  Right second MC 80 and PIP's 100 except fifth digit 90 degrees Patient with hyperextension at 4th and 5th right metacarpals.  More compared to the left. Overcoming using long extensors for decreased wrist extension.    HAND FUNCTION: Grip strength: Right: 62 lbs; Left: 67 lbs, Lateral pinch: Right: 15 lbs, Left: 17 lbs, and 3 point pinch: Right: 15 lbs, Left: 14 lbs  COORDINATION: WNL  SENSATION: Denies any sensory issues  EDEMA: Report increased edema over dorsal hand at the end of the day working  COGNITION: Overall cognitive status: Within functional limits for tasks assessed      TREATMENT DATE: 05/11/23  Modalities: Fluidotherapy:  Time: 8 Location: Right hand and wrist Decrease stiffness and pain increase motion done prior to review of home exercises  Moist heat in the mornings prior to home exercises Patient to do soft tissue  massage to bilateral webspaces.  Prior to thumb palmar radial abduction 10 reps Followed by rubber band for palmar radial abduction strengthening 12 reps Prayer stretch for composite wrist extension 10 reps hold 10 seconds Prior to 2 pound weight for wrist and forearm strengthening 12 reps except wrist extension 10 reps pain-free Strengthening of weight to be done 1 time a day. Can increase in 3 days for strengthening to a second set.  And is pain-free in a week to a third set except wrist extension.  Active range of motion tendon glides 10 reps   Patient to hold off on planks and push-up on the floor through the palms.  Work on wrist extension and may be planks through elbow or wall push-up pain-free Can do contrast at the end of the day.  To decrease edema and pain     PATIENT EDUCATION: Education details: findings of eval and HEP  Person educated: Patient Education method: Explanation, Demonstration, Tactile cues, Verbal cues, and Handouts Education comprehension: verbalized understanding, returned demonstration, verbal cues required, and needs further education       GOALS: Goals reviewed with patient? Yes   LONG TERM GOALS: Target date: 12 wks   R Wrist extension range of motion increased to within normal limits for patient to be able to do wall push-ups and transition to floor push-ups and planks symptom-free Baseline:R  Wrist extension 60 degrees with discomfort and pain on floor planks Goal status: INITIAL  2.  Right wrist and forearm strength increased to 5/5 for patient to use tools, open jars, push and pull heavy door without increase symptoms Baseline: Wrist extension 60 degrees.  With increased weakness and 4/5 strength in supination pronation as well as wrist extension; difficulty maintaining wrist extension with gripping and supination pronation Goal status: INITIAL  3.  Right grip grip strength increased by 5 pounds for patient to open jars and whole tools  without increased symptoms Baseline: Grip right 62 and left 67 pounds; right lateral pinch 15 and left 17.  Patient is right-hand dominant. Goal status: INITIAL  4.  Patient be independent in the home program at discharge for maintaining strength as well as maintaining motion without increased symptoms and pain Baseline: No knowledge of home program. Goal status: INITIAL ASSESSMENT:  CLINICAL IMPRESSION: Patient seen today for occupational therapy evaluation for right hand pain and weakness.  Patient reports she fractured her hand appear fourth metacarpal shaft about 3 to 4 years ago.  Since then patient with increased discomfort and pain, stiffness as well as increased weakness.  Patient very active working mostly in the computer.  But very active around the house and yard work, has a dog, make bread, do workout with weights, organized and clean home.  Patient with decreased wrist extension with increased hyperextension of the 4th and 5th metacarpal.  Decreased wrist and forearm strength as well as decreased grip and pinch strength compared to left hand.  Patient limited in functional use of right dominant hand in ADLs and IADLs by increased edema and pain and decreased motion and strength.  Patient can benefit from skilled OT services to decrease pain and increase motion and strength without increased symptoms to return to prior level of function  PERFORMANCE DEFICITS: in functional skills including ADLs, IADLs,  ROM, strength, pain, flexibility, decreased knowledge of use of DME, and UE functional use,   and psychosocial skills including environmental adaptation and routines and behaviors.   IMPAIRMENTS: are limiting patient from ADLs, IADLs, rest and sleep, play, leisure, and social participation.   COMORBIDITIES: has no other co-morbidities that affects occupational performance. Patient will benefit from skilled OT to address above impairments and improve overall function.  MODIFICATION OR  ASSISTANCE TO COMPLETE EVALUATION: No modification of tasks or assist necessary to complete an evaluation.  OT OCCUPATIONAL PROFILE AND HISTORY: Problem focused assessment: Including review of records relating to presenting problem.  CLINICAL DECISION MAKING: LOW - limited treatment options, no task modification necessary  REHAB POTENTIAL: Good for goals  EVALUATION COMPLEXITY: Low   PLAN:  OT FREQUENCY: 1x/week to biweekly  OT DURATION: 12 weeks  PLANNED INTERVENTIONS: 97168 OT Re-evaluation, 97535 self care/ADL training, 16109 therapeutic exercise, 97530 therapeutic activity, 97140 manual therapy, 97018 paraffin, 60454 fluidotherapy, 97034 contrast bath, passive range of motion, patient/family education, and DME and/or AE instructions    CONSULTED AND AGREED WITH PLAN OF CARE: Patient     Heloise Lobo, OTR/L,CLT 05/11/2023, 9:55 AM

## 2023-05-14 ENCOUNTER — Encounter: Payer: Self-pay | Admitting: Internal Medicine

## 2023-05-18 ENCOUNTER — Ambulatory Visit: Admitting: Occupational Therapy

## 2023-05-18 ENCOUNTER — Encounter: Payer: Self-pay | Admitting: Internal Medicine

## 2023-05-18 ENCOUNTER — Ambulatory Visit: Admitting: Internal Medicine

## 2023-05-18 VITALS — BP 118/72 | HR 71 | Temp 97.5°F | Ht 67.0 in | Wt 154.4 lb

## 2023-05-18 DIAGNOSIS — M545 Low back pain, unspecified: Secondary | ICD-10-CM

## 2023-05-18 DIAGNOSIS — R350 Frequency of micturition: Secondary | ICD-10-CM | POA: Diagnosis not present

## 2023-05-18 DIAGNOSIS — R399 Unspecified symptoms and signs involving the genitourinary system: Secondary | ICD-10-CM | POA: Diagnosis not present

## 2023-05-18 LAB — POC URINALSYSI DIPSTICK (AUTOMATED)
Bilirubin, UA: NEGATIVE
Blood, UA: NEGATIVE
Glucose, UA: NEGATIVE
Ketones, UA: NEGATIVE
Leukocytes, UA: NEGATIVE
Nitrite, UA: NEGATIVE
Protein, UA: NEGATIVE
Spec Grav, UA: 1.015 (ref 1.010–1.025)
Urobilinogen, UA: 0.2 U/dL — AB
pH, UA: 7 (ref 5.0–8.0)

## 2023-05-18 LAB — URINALYSIS, ROUTINE W REFLEX MICROSCOPIC
Bilirubin Urine: NEGATIVE
Hgb urine dipstick: NEGATIVE
Ketones, ur: NEGATIVE
Leukocytes,Ua: NEGATIVE
Nitrite: NEGATIVE
RBC / HPF: NONE SEEN (ref 0–?)
Specific Gravity, Urine: <=1.005 — AB (ref 1.000–1.030)
Total Protein, Urine: NEGATIVE
Urine Glucose: NEGATIVE
Urobilinogen, UA: 0.2 (ref 0.0–1.0)
WBC, UA: NONE SEEN (ref 0–?)
pH: 6.5 (ref 5.0–8.0)

## 2023-05-18 MED ORDER — METAXALONE 800 MG PO TABS: ORAL_TABLET | ORAL | 0 refills | Status: AC

## 2023-05-18 NOTE — Assessment & Plan Note (Signed)
-   Patient complaining of pain in her right-sided lower back  -Patient does workout daily and may have pulled a muscle while working out -On exam, patient was noted to have mild tenderness to palpation over her right-sided lower back but no CVA tenderness noted - She was initially concern for possible pyelonephritis but symptoms appear more consistent with musculoskeletal back pain -I suspect she may have muscle strain secondary to her workout -Patient states that the pain has been relatively stable -Advised conservative measures including heating pads and stretches -Will also refill patient Skelaxin  to see if this helps with her pain -No further workup at this time

## 2023-05-18 NOTE — Patient Instructions (Signed)
-   It was a pleasure meeting you today -I suspect that your lower back pain is secondary to a sprain or a pulled muscle -I would recommend heating pads and muscle relaxers as needed to help with the pain -I put in the refill for your Skelaxin  today -Your symptoms are concerning for an underlying urinary tract infection but the initial urine test showed no evidence of this -We will check a urine culture to see if any bacteria grow in the urine and will call in an antibiotic if this is positive -Please call us  with any questions or concerns -If your symptoms worsen or develop fevers and chills or decreased oral intake or worsening back pain please follow-up with us

## 2023-05-18 NOTE — Progress Notes (Signed)
 Acute Office Visit  Subjective:     Patient ID: Adrienne Phillips, female    DOB: 1970/04/24, 53 y.o.   MRN: [id number]  Chief Complaint  Patient presents with   Urinary Tract Infection    Urinary Tract Infection  Associated symptoms include flank pain, frequency and urgency. Pertinent negatives include no chills, hematuria, nausea or vomiting.   Patient is in today for a possible urinary tract infection.  Patient states that last Friday (3 days ago) she developed right-sided lower back/flank pain which initially worsened but then was stable over the last couple days.  Patient also felt increased urinary frequency as well as urgency but denies any dysuria or fevers or chills.  No decreased oral intake.  No nausea or vomiting.  No abdominal pain.  Patient states that she does workout daily and may have pulled a muscle during a workout.  Review of Systems  Constitutional: Negative.  Negative for chills, fever and malaise/fatigue.  Respiratory: Negative.    Cardiovascular: Negative.   Gastrointestinal: Negative.  Negative for abdominal pain, nausea and vomiting.  Genitourinary:  Positive for flank pain, frequency and urgency. Negative for dysuria and hematuria.  Neurological:  Positive for dizziness.       Patient does have some lightheadedness but that this is chronic  Psychiatric/Behavioral: Negative.          Objective:    BP 118/72   Pulse 71   Temp (!) 97.5 F (36.4 C) (Oral)   Ht 5\' 7"  (1.702 m)   Wt 154 lb 6.4 oz (70 kg)   SpO2 99%   BMI 24.18 kg/m    Physical Exam Constitutional:      Appearance: Normal appearance.  HENT:     Head: Normocephalic and atraumatic.  Cardiovascular:     Rate and Rhythm: Normal rate.     Heart sounds: Normal heart sounds.  Pulmonary:     Effort: Pulmonary effort is normal. No respiratory distress.     Breath sounds: Normal breath sounds. No wheezing or rales.  Abdominal:     General: Bowel sounds are normal. There is no  distension.     Palpations: Abdomen is soft.     Tenderness: There is abdominal tenderness. There is no right CVA tenderness, left CVA tenderness, guarding or rebound.     Comments: Mild suprapubic abdominal tenderness noted to palpation  Musculoskeletal:     Comments: Patient with mild tenderness to palpation over her right lower back extending into her abdomen  Neurological:     Mental Status: She is alert and oriented to person, place, and time.  Psychiatric:        Mood and Affect: Mood normal.        Behavior: Behavior normal.     Results for orders placed or performed in visit on 05/18/23  POCT Urinalysis Dipstick (Automated)  Result Value Ref Range   Color, UA yellow    Clarity, UA clear    Glucose, UA Negative Negative   Bilirubin, UA negative    Ketones, UA negative    Spec Grav, UA 1.015 1.010 - 1.025   Blood, UA negative    pH, UA 7.0 5.0 - 8.0   Protein, UA Negative Negative   Urobilinogen, UA 0.2 (A) 0.2 or 1.0 E.U./dL   Nitrite, UA negative    Leukocytes, UA Negative Negative        Assessment & Plan:   Problem List Items Addressed This Visit  Other   Lower back pain   - Patient complaining of pain in her right-sided lower back  -Patient does workout daily and may have pulled a muscle while working out -On exam, patient was noted to have mild tenderness to palpation over her right-sided lower back but no CVA tenderness noted - She was initially concern for possible pyelonephritis but symptoms appear more consistent with musculoskeletal back pain -I suspect she may have muscle strain secondary to her workout -Patient states that the pain has been relatively stable -Advised conservative measures including heating pads and stretches -Will also refill patient Skelaxin  to see if this helps with her pain -No further workup at this time      Relevant Medications   metaxalone  (SKELAXIN ) 800 MG tablet   RESOLVED: Urine frequency   UTI symptoms - Primary    - Patient states that over the last 2 days she had increased urinary frequency and urgency but no hematuria or dysuria noted -On exam, she does have some mild suprapubic tenderness to palpation but no CVA tenderness noted -Urine dipstick showed no evidence of urinary tract infection -Will follow-up UA and urine culture -Will hold off on antibiotics at this time -Return precautions given to the patient -Will continue to monitor closely      Relevant Orders   POCT Urinalysis Dipstick (Automated) (Completed)   Urinalysis, Routine w reflex microscopic   Urine Culture    Meds ordered this encounter  Medications   metaxalone  (SKELAXIN ) 800 MG tablet    Sig: TAKE 1/2 TO 1 TABLET BY MOUTH AT BEDTIME AS NEEDED FOR BACK SPASMS    Dispense:  30 tablet    Refill:  0    No follow-ups on file.  Malin Sambrano, MD

## 2023-05-18 NOTE — Assessment & Plan Note (Signed)
-   Patient states that over the last 2 days she had increased urinary frequency and urgency but no hematuria or dysuria noted -On exam, she does have some mild suprapubic tenderness to palpation but no CVA tenderness noted -Urine dipstick showed no evidence of urinary tract infection -Will follow-up UA and urine culture -Will hold off on antibiotics at this time -Return precautions given to the patient -Will continue to monitor closely

## 2023-05-19 LAB — URINE CULTURE
MICRO NUMBER:: 16383019
Result:: NO GROWTH
SPECIMEN QUALITY:: ADEQUATE

## 2023-05-21 ENCOUNTER — Encounter: Admitting: Occupational Therapy

## 2023-05-25 ENCOUNTER — Encounter: Payer: Self-pay | Admitting: Occupational Therapy

## 2023-05-25 ENCOUNTER — Ambulatory Visit: Attending: Internal Medicine | Admitting: Occupational Therapy

## 2023-05-25 DIAGNOSIS — M79641 Pain in right hand: Secondary | ICD-10-CM | POA: Diagnosis present

## 2023-05-25 DIAGNOSIS — M25631 Stiffness of right wrist, not elsewhere classified: Secondary | ICD-10-CM | POA: Insufficient documentation

## 2023-05-25 DIAGNOSIS — M6281 Muscle weakness (generalized): Secondary | ICD-10-CM | POA: Diagnosis present

## 2023-05-25 NOTE — Therapy (Unsigned)
 OUTPATIENT OCCUPATIONAL THERAPY ORTHO TREATMENT  Patient Name: Adrienne Phillips MRN: [id number] DOB:April 10, 1970, 53 y.o., female Today's Date: 05/26/2023  PCP: Dr Adrienne Phillips REFERRING PROVIDER: Dr Adrienne Phillips  END OF SESSION:  OT End of Session - 05/25/23 0819     Visit Number 2    Number of Visits 6    Date for OT Re-Evaluation 08/03/23    OT Start Time 0816    OT Stop Time 0900    OT Time Calculation (min) 44 min    Activity Tolerance Patient tolerated treatment well    Behavior During Therapy [facility] for tasks assessed/performed             Past Medical History:  Diagnosis Date   Anemia    BRCA negative 02/2021   MyRisk neg except AXIN2 VUS   Family history of breast cancer    Hypoglycemia    Increased risk of breast cancer 02/2021   IBIS=35.4%/riskscore=26.4%   Iron deficiency    Migraine    Ovarian cyst    Past Surgical History:  Procedure Laterality Date   BREAST CYST ASPIRATION Left 1998   BREAST CYST ASPIRATION Left 09/06/2020   1:00 FNA   Patient Active Problem List   Diagnosis Date Noted   UTI symptoms 05/18/2023   Hand pain 03/23/2023   Hip pain 03/23/2023   Breast cancer screening, high risk patient 03/19/2023   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   Alcohol use 05/29/2022   Joint pain 05/14/2022   History of genetic counseling 07/03/2021   Dysphagia 05/11/2021   Family history of breast cancer 02/12/2021   Persistent cough 01/29/2021   Family history of aneurysm 10/28/2020   Vaginal discharge 02/20/2020   Left sided abdominal pain 02/15/2020   Weight gain 02/15/2020   Vaccine counseling 09/18/2019   Hearing loss 09/18/2019   Headache 07/07/2019   Breast tenderness 07/03/2019   Irregular periods 09/13/2018   Facial droop 04/10/2017   Numbness and tingling of right arm and leg 04/10/2017   Vaginal lesion 12/21/2016   Sinusitis 10/10/2016   Stress 07/18/2016   History of abnormal cervical Pap smear 01/06/2016    Chest pain 06/30/2015   Left arm numbness 06/27/2015   Neck pain 06/27/2015   Bad odor of urine 11/13/2014   Lower back pain 11/08/2014   History of colonic polyps 03/19/2014   Health care maintenance 03/19/2014   Congestion of nasal sinus 03/19/2014   Barrett's esophagus 03/20/2013   Anemia 02/15/2012   Leukopenia 02/15/2012    ONSET DATE: 3 -4 yrs ago  REFERRING DIAG: R hand pain and weakness  THERAPY DIAG: R wrist stiffness , muscle weakness, R hand pain    Rationale for Evaluation and Treatment: Rehabilitation  SUBJECTIVE:   SUBJECTIVE STATEMENT: See below in treatment note for subjective Pt accompanied by: self  PERTINENT HISTORY: Dr Adrienne Phillips note Pain of right hand 03/19/23 Assessment & Plan: Increased pain/discomfort. Affecting grip, etc. Refer to OT for further evaluation  PRECAUTIONS: None  WEIGHT BEARING RESTRICTIONS: No  PAIN:  Are you having pain? 2/10 end of day dorsal hand - with increase edema   FALLS: Has patient fallen in last 6 months? 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  60 hours a week.  She likes to clean and organize the house.  Help other people.Adrienne Phillips work.  Has a dog.  Do crafts.  Making bread.  Do workout do weights at home  PATIENT GOALS: I just want the pain better and my strength in my right hand  NEXT MD VISIT: ?  OBJECTIVE:  Note: Objective measures were completed at Evaluation unless otherwise noted.  HAND DOMINANCE: Right  ADLs: Pain and discomfort with planks, push-ups , workout with weights, working the yard ,cleaning ,cooking ,work on the computer  FUNCTIONAL OUTCOME MEASURES: Assess next session  UPPER EXTREMITY ROM:     Active ROM Right eval Left eval  Shoulder flexion    Shoulder abduction    Shoulder adduction    Shoulder extension    Shoulder internal rotation    Shoulder external rotation    Elbow flexion     Elbow extension    Wrist flexion 90   Wrist extension 60 4/5 strength   Wrist ulnar deviation 30   Wrist radial deviation 20   Wrist pronation 4/5   Wrist supination 4/5   (Blank rows = not tested)  Active ROM Right eval Left eval  Thumb MCP (0-60)    Thumb IP (0-80)    Thumb Radial abd/add (0-55)     Thumb Palmar abd/add (0-45)     Thumb Opposition to Small Finger     Index MCP (0-90)     Index PIP (0-100)     Index DIP (0-70)      Long MCP (0-90)      Long PIP (0-100)      Long DIP (0-70)      Ring MCP (0-90)      Ring PIP (0-100)      Ring DIP (0-70)      Little MCP (0-90)      Little PIP (0-100)      Little DIP (0-70)      (Active range of motion within functional limits in bilateral hands.  Right second MC 80 and PIP's 100 except fifth digit 90 degrees Patient with hyperextension at 4th and 5th right metacarpals.  More compared to the left. Overcoming using long extensors for decreased wrist extension.  HAND FUNCTION: Grip strength: Right: 62 lbs; Left: 67 lbs, Lateral pinch: Right: 15 lbs, Left: 17 lbs, and 3 point pinch: Right: 15 lbs, Left: 14 lbs  COORDINATION: WNL  SENSATION: Denies any sensory issues  EDEMA: Report increased edema over dorsal hand at the end of the day working  COGNITION: Overall cognitive status: Within functional limits for tasks assessed  TREATMENT DATE: 05/25/23      Pt reports she is improving.  Some continued tightness in her forearms                                                                                                                        Modalities: Fluidotherapy:  Time: 8 Location: Right hand and wrist Decrease stiffness and pain increase motion done prior to review of home exercises  Manual Therapy:  Patient seen for soft tissue massage  with carpal and metacarpal spreads, additional soft tissue  massage to bilateral hands and wrist with focus on web spaces, use of clip for prolonged stretch/pressure for 2  cycles of 20 secs each.  Use of graston tool Nr2 with sweeping of forearm to decrease adhesions and promote tissue mobility and improved ROM.  Therapeutic Exercises: Prayer stretch for composite wrist extension 10 reps hold 10 seconds Active range of motion tendon glides 10 reps Active Thumb palmar radial abduction 10 reps Resistive rubber band for palmar radial abduction strengthening 12 reps for 2 sets, cues for proper form and technique.   2 pound weight for wrist and forearm strengthening 12 reps except wrist extension 10 reps pain-free, pt currently performing 3 sets   HEP: Patient to hold off on planks and push-up on the floor through the palms.  Work on wrist extension and may be planks through elbow or wall push-up pain-free Moist heat in the mornings prior to home exercises Increase reps for exercises with 2# weight 15-20 over the next few days as long as pain remains 2/10 or less (does not have a 3# weight) Can do contrast at the end of the day to promote decreased edema and pain   PATIENT EDUCATION: Education details: findings of eval and HEP  Person educated: Patient Education method: Explanation, Demonstration, Tactile cues, Verbal cues, and Handouts Education comprehension: verbalized understanding, returned demonstration, verbal cues required, and needs further education   GOALS: Goals reviewed with patient? Yes   LONG TERM GOALS: Target date: 12 wks   R Wrist extension range of motion increased to within normal limits for patient to be able to do wall push-ups and transition to floor push-ups and planks symptom-free Baseline:R  Wrist extension 60 degrees with discomfort and pain on floor planks Goal status: INITIAL  2.  Right wrist and forearm strength increased to 5/5 for patient to use tools, open jars, push and pull heavy door without increase symptoms Baseline: Wrist extension 60 degrees.  With increased weakness and 4/5 strength in supination pronation as well as  wrist extension; difficulty maintaining wrist extension with gripping and supination pronation Goal status: INITIAL  3.  Right grip grip strength increased by 5 pounds for patient to open jars and whole tools without increased symptoms Baseline: Grip right 62 and left 67 pounds; right lateral pinch 15 and left 17.  Patient is right-hand dominant. Goal status: INITIAL  4.  Patient be independent in the home program at discharge for maintaining strength as well as maintaining motion without increased symptoms and pain Baseline: No knowledge of home program. Goal status: INITIAL ASSESSMENT:  CLINICAL IMPRESSION: Patient seen for occupational therapy evaluation for right hand pain and weakness.  Patient reports she fractured her hand appear fourth metacarpal shaft about 3 to 4 years ago.  Since then patient with increased discomfort and pain, stiffness as well as increased weakness.  Patient very active working mostly in the computer.  But very active around the house and yard work, has a dog, make bread, do workout with weights, organized and clean home.  Patient with decreased wrist extension with increased hyperextension of the 4th and 5th metacarpal.  Decreased wrist and forearm strength as well as decreased grip and pinch strength compared to left hand. Pt progressing well with exercises, min cues for rubber band exercises this date for proper form and technique. Pt does not have a 3# weight at home, recommend she increase reps over the next few days to 15-20 and will reassess next session to determine further progression with heavier  weight.   Patient remains limited in functional use of right dominant hand in ADLs and IADLs by increased edema and pain and decreased motion and strength.  Patient can benefit from skilled OT services to decrease pain and increase motion and strength without increased symptoms to return to prior level of function  PERFORMANCE DEFICITS: in functional skills including  ADLs, IADLs, ROM, strength, pain, flexibility, decreased knowledge of use of DME, and UE functional use,   and psychosocial skills including environmental adaptation and routines and behaviors.   IMPAIRMENTS: are limiting patient from ADLs, IADLs, rest and sleep, play, leisure, and social participation.   COMORBIDITIES: has no other co-morbidities that affects occupational performance. Patient will benefit from skilled OT to address above impairments and improve overall function.  MODIFICATION OR ASSISTANCE TO COMPLETE EVALUATION: No modification of tasks or assist necessary to complete an evaluation.  OT OCCUPATIONAL PROFILE AND HISTORY: Problem focused assessment: Including review of records relating to presenting problem.  CLINICAL DECISION MAKING: LOW - limited treatment options, no task modification necessary  REHAB POTENTIAL: Good for goals  EVALUATION COMPLEXITY: Low   PLAN:  OT FREQUENCY: 1x/week to biweekly  OT DURATION: 12 weeks  PLANNED INTERVENTIONS: 97168 OT Re-evaluation, 97535 self care/ADL training, 16109 therapeutic exercise, 97530 therapeutic activity, 97140 manual therapy, 97018 paraffin, 60454 fluidotherapy, 97034 contrast bath, passive range of motion, patient/family education, and DME and/or AE instructions   CONSULTED AND AGREED WITH PLAN OF CARE: Patient   Andrian Sabala, OTR/L,CLT 05/26/2023, 3:25 PM

## 2023-05-28 ENCOUNTER — Encounter: Admitting: Occupational Therapy

## 2023-06-19 ENCOUNTER — Ambulatory Visit: Admitting: Occupational Therapy

## 2023-06-19 DIAGNOSIS — M25631 Stiffness of right wrist, not elsewhere classified: Secondary | ICD-10-CM | POA: Diagnosis not present

## 2023-06-19 DIAGNOSIS — M79641 Pain in right hand: Secondary | ICD-10-CM

## 2023-06-19 DIAGNOSIS — M6281 Muscle weakness (generalized): Secondary | ICD-10-CM

## 2023-06-19 NOTE — Therapy (Signed)
 OUTPATIENT OCCUPATIONAL THERAPY ORTHO TREATMENT  Patient Name: Adrienne Phillips MRN: [id number] DOB:1970/10/03, 53 y.o., female Today's Date: 06/19/2023  PCP: Dr Geralyn Knee REFERRING PROVIDER: Dr Geralyn Knee  END OF SESSION:  OT End of Session - 06/19/23 0818     Visit Number 3    Number of Visits 6    Date for OT Re-Evaluation 08/03/23    OT Start Time 0818    OT Stop Time 0857    OT Time Calculation (min) 39 min    Activity Tolerance Patient tolerated treatment well    Behavior During Therapy [facility] for tasks assessed/performed             Past Medical History:  Diagnosis Date   Anemia    BRCA negative 02/2021   MyRisk neg except AXIN2 VUS   Family history of breast cancer    Hypoglycemia    Increased risk of breast cancer 02/2021   IBIS=35.4%/riskscore=26.4%   Iron deficiency    Migraine    Ovarian cyst    Past Surgical History:  Procedure Laterality Date   BREAST CYST ASPIRATION Left 1998   BREAST CYST ASPIRATION Left 09/06/2020   1:00 FNA   Patient Active Problem List   Diagnosis Date Noted   UTI symptoms 05/18/2023   Hand pain 03/23/2023   Hip pain 03/23/2023   Breast cancer screening, high risk patient 03/19/2023   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   Alcohol use 05/29/2022   Joint pain 05/14/2022   History of genetic counseling 07/03/2021   Dysphagia 05/11/2021   Family history of breast cancer 02/12/2021   Persistent cough 01/29/2021   Family history of aneurysm 10/28/2020   Vaginal discharge 02/20/2020   Left sided abdominal pain 02/15/2020   Weight gain 02/15/2020   Vaccine counseling 09/18/2019   Hearing loss 09/18/2019   Headache 07/07/2019   Breast tenderness 07/03/2019   Irregular periods 09/13/2018   Facial droop 04/10/2017   Numbness and tingling of right arm and leg 04/10/2017   Vaginal lesion 12/21/2016   Sinusitis 10/10/2016   Stress 07/18/2016   History of abnormal cervical Pap smear 01/06/2016    Chest pain 06/30/2015   Left arm numbness 06/27/2015   Neck pain 06/27/2015   Bad odor of urine 11/13/2014   Lower back pain 11/08/2014   History of colonic polyps 03/19/2014   Health care maintenance 03/19/2014   Congestion of nasal sinus 03/19/2014   Barrett's esophagus 03/20/2013   Anemia 02/15/2012   Leukopenia 02/15/2012    ONSET DATE: 3 -4 yrs ago  REFERRING DIAG: R hand pain and weakness  THERAPY DIAG: R wrist stiffness , muscle weakness, R hand pain    Rationale for Evaluation and Treatment: Rehabilitation  SUBJECTIVE:   SUBJECTIVE STATEMENT: The pain is better.  But I still cannot really push up on my hand.  Still feeling some discomfort. But I do stretches during the day- stiff in the am Pt accompanied by: self  PERTINENT HISTORY: Dr Geralyn Knee note Pain of right hand 03/19/23 Assessment & Plan: Increased pain/discomfort. Affecting grip, etc. Refer to OT for further evaluation  PRECAUTIONS: None  WEIGHT BEARING RESTRICTIONS: No  PAIN:  Are you having pain? 2/10 dorsal hand ulnar side  FALLS: Has patient fallen in last 6 months? 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  60 hours a week.  She likes to clean and organize the  house.  Help other people.[name] work.  Has a dog.  Do crafts.  Making bread.  Do workout do weights at home  PATIENT GOALS: I just want the pain better and my strength in my right hand  NEXT MD VISIT: ?  OBJECTIVE:  Note: Objective measures were completed at Evaluation unless otherwise noted.  HAND DOMINANCE: Right  ADLs: Pain and discomfort with planks, push-ups , workout with weights, working the yard ,cleaning ,cooking ,work on the computer  FUNCTIONAL OUTCOME MEASURES: Assess next session  UPPER EXTREMITY ROM:     Active ROM Right eval Left eval R 06/19/23  Shoulder flexion     Shoulder abduction     Shoulder adduction      Shoulder extension     Shoulder internal rotation     Shoulder external rotation     Elbow flexion     Elbow extension     Wrist flexion 90  90 5/5 strength  Wrist extension 60 4/5 strength  65 pull on dorsal hand - tight volar ulnar wrist and forearm 5/5 strength in plane  Wrist ulnar deviation 30  5/5  Wrist radial deviation 20  5/5  Wrist pronation 4/5  5/5  Wrist supination 4/5  5/5  (Blank rows = not tested)  Active ROM Right eval Left eval  Thumb MCP (0-60)    Thumb IP (0-80)    Thumb Radial abd/add (0-55)     Thumb Palmar abd/add (0-45)     Thumb Opposition to Small Finger     Index MCP (0-90)     Index PIP (0-100)     Index DIP (0-70)      Long MCP (0-90)      Long PIP (0-100)      Long DIP (0-70)      Ring MCP (0-90)      Ring PIP (0-100)      Ring DIP (0-70)      Little MCP (0-90)      Little PIP (0-100)      Little DIP (0-70)      (Active range of motion within functional limits in bilateral hands.  Right second MC 80 and PIP's 100 except fifth digit 90 degrees Patient with hyperextension at 4th and 5th right metacarpals.  More compared to the left. Overcoming using long extensors for decreased wrist extension.  HAND FUNCTION: Grip strength: Right: 62 lbs; Left: 67 lbs, Lateral pinch: Right: 15 lbs, Left: 17 lbs, and 3 point pinch: Right: 15 lbs, Left: 14 lbs 06/19/23 Grip strength: Right: 60 lbs; Left: 65 lbs, Lateral pinch: Right: 17 lbs, Left: 16 lbs, and 3 point pinch: Right: 16 lbs, Left: 14 lbs  COORDINATION: WNL  SENSATION: Denies any sensory issues  EDEMA: Report increased edema over dorsal hand at the end of the day working  COGNITION: Overall cognitive status: Within functional limits for tasks assessed  TREATMENT DATE: 05/25/23      Pt reports she is improving.  Some continued tightness i in ulnar side of right hand with composite fist.  Patient continues to use long extensors of 4th and 5th to assist with wrist extension endrange. Still  some tightness on the volar hand, wrist and forearm on ulnar side limiting her wrist extension. See flowsheets. Grip still decreased compared to the left but prehension strength back to normal Reinforced again with patient flexibility more important in soft tissue than strength. Strength within normal limits except grip.  Modalities: Fluidotherapy:  Time: 8 Location: Right hand and wrist Decrease stiffness and pain increase motion done prior to review of home exercises  Manual Therapy:  Patient seen for soft tissue massage  with carpal and metacarpal spreads, Use of graston tool Nr2 with sweeping over volar hand, wrist and forearm to decrease adhesions and promote tissue mobility and improved ROM. Provided patient with a right foam roller to facilitate soft tissue massage over volar forearm and wrist and hand at home prior to stretches.  Therapeutic Exercises: Reinforced importance of patient to do in the morning as well as a few times during the day Prayer stretch for composite wrist extension hold 5 seconds 10 reps no pressure through the palms. Followed by table slides or wall slides to facilitate passive motion from 70 to 90 degrees of wrist extension 10-20 reps pain-free  Attempted wall push-up but patient still feels discomfort at the dorsal wrist.   Patient to hold off.    Active range of motion tendon glides 10 reps with a focus for patient on MCP flexion and lumbrical f   HEP: Patient to hold off on planks and push-up on the floor through the palms.  Work on wrist extension passive motion and stretches and may do planks through elbow  Moist heat in the mornings prior to soft tissue lopes and then stretches    PATIENT EDUCATION: Education details: f progress and HEP  Person educated: Patient Education method: Explanation, Demonstration, Tactile cues, Verbal  cues, and Handouts Education comprehension: verbalized understanding, returned demonstration, verbal cues required, and needs further education   GOALS: Goals reviewed with patient? Yes   LONG TERM GOALS: Target date: 12 wks   R Wrist extension range of motion increased to within normal limits for patient to be able to do wall push-ups and transition to floor push-ups and planks symptom-free Baseline:R  Wrist extension 60 degrees with discomfort and pain on floor planks Goal status: INITIAL  2.  Right wrist and forearm strength increased to 5/5 for patient to use tools, open jars, push and pull heavy door without increase symptoms Baseline: Wrist extension 60 degrees.  With increased weakness and 4/5 strength in supination pronation as well as wrist extension; difficulty maintaining wrist extension with gripping and supination pronation Goal status: INITIAL  3.  Right grip grip strength increased by 5 pounds for patient to open jars and whole tools without increased symptoms Baseline: Grip right 62 and left 67 pounds; right lateral pinch 15 and left 17.  Patient is right-hand dominant. Goal status: INITIAL  4.  Patient be independent in the home program at discharge for maintaining strength as well as maintaining motion without increased symptoms and pain Baseline: No knowledge of home program. Goal status: INITIAL ASSESSMENT:  CLINICAL IMPRESSION: Patient seen for occupational therapy evaluation for right hand pain and weakness.  Patient reports she fractured her hand appear fourth metacarpal shaft about 3 to 4 years ago.  Since then patient with increased discomfort and pain, stiffness as well as increased weakness.  Patient very active working mostly in the computer.  But very active around the house and yard work, has a dog, make bread, do workout with weights, organized and clean home.  Patient today seen for second follow-up visit.  Patient continues to show  decreased wrist  extension with increased hyperextension of the 4th and 5th metacarpal.  Patient do show increased wrist and forearm strength but continues to show decreased grip but increase prehension strength to within normal  limits compared to left hand.  Reinforced with patient today that soft tissue and flexibility is more important than strength at this moment.  Patient to facilitate soft tissue lopes to volar hand, wrist and forearm arm prior to stretches.   Patient remains limited in functional use of right dominant hand in ADLs and IADLs by increased edema and pain and decreased motion and strength.  Patient can benefit from skilled OT services to decrease pain and increase motion and strength without increased symptoms to return to prior level of function  PERFORMANCE DEFICITS: in functional skills including ADLs, IADLs, ROM, strength, pain, flexibility, decreased knowledge of use of DME, and UE functional use,   and psychosocial skills including environmental adaptation and routines and behaviors.   IMPAIRMENTS: are limiting patient from ADLs, IADLs, rest and sleep, play, leisure, and social participation.   COMORBIDITIES: has no other co-morbidities that affects occupational performance. Patient will benefit from skilled OT to address above impairments and improve overall function.  MODIFICATION OR ASSISTANCE TO COMPLETE EVALUATION: No modification of tasks or assist necessary to complete an evaluation.  OT OCCUPATIONAL PROFILE AND HISTORY: Problem focused assessment: Including review of records relating to presenting problem.  CLINICAL DECISION MAKING: LOW - limited treatment options, no task modification necessary  REHAB POTENTIAL: Good for goals  EVALUATION COMPLEXITY: Low   PLAN:  OT FREQUENCY: 1x/week to biweekly  OT DURATION: 12 weeks  PLANNED INTERVENTIONS: 97168 OT Re-evaluation, 97535 self care/ADL training, 52841 therapeutic exercise, 97530 therapeutic activity, 97140 manual therapy,  97018 paraffin, 32440 fluidotherapy, 97034 contrast bath, passive range of motion, patient/family education, and DME and/or AE instructions   CONSULTED AND AGREED WITH PLAN OF CARE: Patient   Heloise Lobo, OTR/L,CLT 06/19/2023, 9:03 AM

## 2023-07-03 ENCOUNTER — Inpatient Hospital Stay: Payer: BC Managed Care – PPO | Attending: Oncology

## 2023-07-03 DIAGNOSIS — D72820 Lymphocytosis (symptomatic): Secondary | ICD-10-CM

## 2023-07-03 DIAGNOSIS — D72819 Decreased white blood cell count, unspecified: Secondary | ICD-10-CM

## 2023-07-03 DIAGNOSIS — F1090 Alcohol use, unspecified, uncomplicated: Secondary | ICD-10-CM | POA: Insufficient documentation

## 2023-07-03 DIAGNOSIS — C9110 Chronic lymphocytic leukemia of B-cell type not having achieved remission: Secondary | ICD-10-CM | POA: Diagnosis present

## 2023-07-03 LAB — CMP (CANCER CENTER ONLY)
ALT: 26 U/L (ref 0–44)
AST: 29 U/L (ref 15–41)
Albumin: 4.2 g/dL (ref 3.5–5.0)
Alkaline Phosphatase: 73 U/L (ref 38–126)
Anion gap: 9 (ref 5–15)
BUN: 12 mg/dL (ref 6–20)
CO2: 26 mmol/L (ref 22–32)
Calcium: 8.7 mg/dL — ABNORMAL LOW (ref 8.9–10.3)
Chloride: 101 mmol/L (ref 98–111)
Creatinine: 0.62 mg/dL (ref 0.44–1.00)
GFR, Estimated: >60 mL/min (ref >60)
Glucose, Bld: 121 mg/dL — ABNORMAL HIGH (ref 70–99)
Potassium: 4.2 mmol/L (ref 3.5–5.1)
Sodium: 136 mmol/L (ref 135–145)
Total Bilirubin: 0.6 mg/dL (ref 0.0–1.2)
Total Protein: 7.1 g/dL (ref 6.5–8.1)

## 2023-07-03 LAB — CBC WITH DIFFERENTIAL (CANCER CENTER ONLY)
Abs Immature Granulocytes: 0.01 10*3/uL (ref 0.00–0.07)
Basophils Absolute: 0.1 10*3/uL (ref 0.0–0.1)
Basophils Relative: 2 %
Eosinophils Absolute: 0.1 10*3/uL (ref 0.0–0.5)
Eosinophils Relative: 3 %
HCT: 38.1 % (ref 36.0–46.0)
Hemoglobin: 12.6 g/dL (ref 12.0–15.0)
Immature Granulocytes: 0 %
Lymphocytes Relative: 32 %
Lymphs Abs: 0.9 10*3/uL (ref 0.7–4.0)
MCH: 28.8 pg (ref 26.0–34.0)
MCHC: 33.1 g/dL (ref 30.0–36.0)
MCV: 87.2 fL (ref 80.0–100.0)
Monocytes Absolute: 0.3 10*3/uL (ref 0.1–1.0)
Monocytes Relative: 11 %
Neutro Abs: 1.5 10*3/uL — ABNORMAL LOW (ref 1.7–7.7)
Neutrophils Relative %: 52 %
Platelet Count: 232 10*3/uL (ref 150–400)
RBC: 4.37 MIL/uL (ref 3.87–5.11)
RDW: 12.9 % (ref 11.5–15.5)
WBC Count: 2.8 10*3/uL — ABNORMAL LOW (ref 4.0–10.5)
nRBC: 0 % (ref 0.0–0.2)

## 2023-07-03 LAB — VITAMIN B12: Vitamin B-12: 584 pg/mL (ref 180–914)

## 2023-07-10 ENCOUNTER — Ambulatory Visit: Admitting: Occupational Therapy

## 2023-07-10 ENCOUNTER — Inpatient Hospital Stay: Payer: BC Managed Care – PPO | Admitting: Oncology

## 2023-07-10 ENCOUNTER — Encounter: Payer: Self-pay | Admitting: Oncology

## 2023-07-10 VITALS — BP 106/67 | HR 70 | Temp 96.0°F | Resp 18 | Wt 152.4 lb

## 2023-07-10 DIAGNOSIS — D72820 Lymphocytosis (symptomatic): Secondary | ICD-10-CM

## 2023-07-10 DIAGNOSIS — D72819 Decreased white blood cell count, unspecified: Secondary | ICD-10-CM | POA: Diagnosis not present

## 2023-07-10 DIAGNOSIS — F1090 Alcohol use, unspecified, uncomplicated: Secondary | ICD-10-CM

## 2023-07-10 DIAGNOSIS — C9110 Chronic lymphocytic leukemia of B-cell type not having achieved remission: Secondary | ICD-10-CM | POA: Diagnosis not present

## 2023-07-10 NOTE — Assessment & Plan Note (Addendum)
 Encourage patient's cessation effort.  Recommend B12 supplementation daily to  2-3 times per week.

## 2023-07-10 NOTE — Assessment & Plan Note (Signed)
 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 No constitutional symptoms.  Recommend observation.

## 2023-07-10 NOTE — Progress Notes (Addendum)
 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 No constitutional symptoms.  Recommend observation.   Leukopenia Labs are reviewed and discussed with patient. Wbc is stable, improved.  normal CBC with differential; normal  LDH; normal folate and Vitamin B12, negative hepatitis, negative HIV, negative EBV, CMV, ANCA, ANA.   Leukopenia is worse despite decrease of alcohol consumption. Could be due to recent URI. Repeat lab in 1 month Observation.   Alcohol use Encourage patient's cessation effort.  Recommend B12 supplementation daily to  2-3 times per week.    Orders Placed This Encounter  Procedures   CBC with Differential (Cancer Center Only)    Standing Status:   Future    Expected Date:   08/09/2023    Expiration Date:   11/07/2023   Flow cytometry panel-leukemia/lymphoma work-up    Standing Status:   Future    Expected Date:   08/09/2023    Expiration Date:   11/07/2023   Folate    Standing Status:   Future    Expected Date:   08/09/2023    Expiration Date:   11/07/2023   Technologist smear review    Standing Status:   Future    Expected Date:   08/09/2023    Expiration Date:   11/07/2023    Clinical information::   Monoclonal B-cell lymphocytosis of undetermined significance   CBC with Differential (Cancer Center Only)    Standing Status:   Future     Expected Date:   01/09/2024    Expiration Date:   04/08/2024   Folate    Standing Status:   Future    Expected Date:   01/09/2024    Expiration Date:   04/08/2024   Vitamin B12    Standing Status:   Future    Expected Date:   01/09/2024    Expiration Date:   04/08/2024   Follow up in 6 months.  All questions were answered. The patient knows to call the clinic with any problems, questions or concerns.  Timmy Forbes, MD, PhD [facility] Hematology Oncology 07/10/2023    HISTORY OF PRESENTING ILLNESS:  Adrienne Phillips is a 53 y.o. female who was seen in consultation at the request of Dellar Fenton, MD for evaluation of leukopenia Reviewed patient's recent labs. Patient has low total WBC count was 2.4 on 05/14/2022,, predominantly neutropenia with ANC of 1.3. Previous lab records reviewed. Leukopenia duration is chronic onset, duration is since at least 2022.  Patient denies fatigue, weight loss, fever, chills, frequent infection.  Denies history hepatitis or HIV infection Denies history of chronic liver disease Patient drinks 2-3 bottles of wine per week.  Mostly on weekends. Vitamin B12 supplementation is on her medication list, she is not currently taking. 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  Recently she has had a CRP checked which were normal.  Denies any skin rash.  She has a family history of autoimmune disease.  INTERVAL HISTORY  Adrienne Phillips is a 53 y.o. female who has above history reviewed by me today presents for follow up visit for leukopenia.  Has cut back alcohol consumption. She drinks 1 bottle of wine every 2 weeks Recent URI symptoms, after her blood draw.  No new complaints.   MEDICAL HISTORY:  Past Medical History:  Diagnosis Date   Anemia    BRCA negative 02/2021   MyRisk neg except AXIN2 VUS   Family history of breast cancer    Hypoglycemia    Increased risk of breast cancer 02/2021    IBIS=35.4%/riskscore=26.4%   Iron deficiency    Migraine    Ovarian cyst     SURGICAL HISTORY: Past Surgical History:  Procedure Laterality Date   BREAST CYST ASPIRATION Left 1998   BREAST CYST ASPIRATION Left 09/06/2020   1:00 FNA    SOCIAL HISTORY: Social History   Socioeconomic History   Marital status: Married    Spouse name: Not on file   Number of children: 0   Years of education: Not on file   Highest education level: Bachelor's degree (e.g., BA, AB, BS)  Occupational History    Employer: [employer]  Tobacco Use   Smoking status: Never   Smokeless tobacco: Never  Vaping Use   Vaping status: Never Used  Substance and Sexual Activity   Alcohol use: Yes    Comment: 2-3 bottles wine per week.   Drug use: No   Sexual activity: Yes  Other Topics Concern   Not on file  Social History Narrative   Not on file   Social Drivers of Health   Financial Resource Strain: Low Risk  (03/18/2023)   Overall Financial Resource Strain (CARDIA)    Difficulty of Paying Living Expenses: Not hard at all  Food Insecurity: No Food Insecurity (03/18/2023)   Hunger Vital Sign    Worried About Running Out of Food in the Last Year: Never true    Ran Out of Food in the Last Year: Never true  Transportation Needs: No Transportation Needs (03/18/2023)   PRAPARE - Administrator, Civil Service (Medical): No    Lack of Transportation (Non-Medical): No  Physical Activity: Sufficiently Active (03/18/2023)   Exercise Vital Sign    Days of Exercise per Week: 6 days    Minutes of Exercise per Session: 60 min  Stress: Stress Concern Present (03/18/2023)   [employer] of Occupational Health - Occupational Stress Questionnaire    Feeling of Stress : To some extent  Social Connections: Socially Integrated (03/18/2023)   Social Connection and Isolation Panel    Frequency of Communication with Friends and Family: More than three times a week    Frequency of Social Gatherings with Friends  and Family: Three times a week    Attends Religious Services: More than 4 times per year    Active Member of Clubs or Organizations: Yes    Attends Banker Meetings: More than 4 times per year    Marital Status: Married  Catering manager Violence: Not At Risk (05/29/2022)   Humiliation, Afraid, Rape, and Kick questionnaire    Fear of Current or Ex-Partner: No    Emotionally Abused: No    Physically Abused: No    Sexually Abused: No    FAMILY HISTORY: Family History  Problem Relation Age of Onset   Hypertension Mother        migraines   Colon cancer Father        8s  Skin cancer Father    Breast cancer Sister 82   Rheum arthritis Brother    Osteoporosis Paternal Grandmother    Ulcers Paternal Grandfather    Breast cancer Maternal Aunt        74   Breast cancer Paternal Aunt 60    ALLERGIES:  is allergic to codeine and topamax [topiramate].  MEDICATIONS:  Current Outpatient Medications  Medication Sig Dispense Refill   ALPRAZolam  (XANAX ) 0.25 MG tablet Take 1 tablet (0.25 mg total) by mouth daily as needed for sleep. 15 tablet 0   ASHWAGANDHA PO      MAGNESIUM GLUCONATE PO Take by mouth.     metaxalone  (SKELAXIN ) 800 MG tablet TAKE 1/2 TO 1 TABLET BY MOUTH AT BEDTIME AS NEEDED FOR BACK SPASMS 30 tablet 0   Multiple Vitamin (MULTIVITAMIN) capsule Take 1 capsule by mouth daily.     No current facility-administered medications for this visit.     Review of Systems  Constitutional:  Negative for appetite change, chills, fatigue and fever.  HENT:   Negative for hearing loss and voice change.   Eyes:  Negative for eye problems.  Respiratory:  Negative for chest tightness and cough.   Cardiovascular:  Negative for chest pain.  Gastrointestinal:  Negative for abdominal distention, abdominal pain and blood in stool.  Endocrine: Negative for hot flashes.  Genitourinary:  Negative for difficulty urinating and frequency.   Musculoskeletal:  Positive for  arthralgias.  Skin:  Negative for itching and rash.  Neurological:  Negative for extremity weakness.  Hematological:  Negative for adenopathy.  Psychiatric/Behavioral:  Negative for confusion.     PHYSICAL EXAMINATION: ECOG PERFORMANCE STATUS: 0 - Asymptomatic Vitals:   07/10/23 1002  BP: 106/67  Pulse: 70  Resp: 18  Temp: (!) 96 F (35.6 C)  SpO2: 100%   Filed Weights   07/10/23 1002  Weight: 152 lb 6.4 oz (69.1 kg)     Physical Exam Constitutional:      General: She is not in acute distress. HENT:     Head: Normocephalic and atraumatic.   Eyes:     General: No scleral icterus.   Cardiovascular:     Rate and Rhythm: Normal rate.  Pulmonary:     Effort: Pulmonary effort is normal. No respiratory distress.  Abdominal:     General: There is no distension.   Musculoskeletal:        General: Normal range of motion.     Cervical back: Normal range of motion and neck supple.   Skin:    Findings: No erythema or rash.   Neurological:     Mental Status: She is alert and oriented to person, place, and time. Mental status is at baseline.     Cranial Nerves: No cranial nerve deficit.   Psychiatric:        Mood and Affect: Mood normal.     RADIOGRAPHIC STUDIES: I have personally reviewed the radiological images as listed and agreed with the findings in the report. No results found.   LABORATORY DATA:  I have reviewed the data as listed    Latest Ref Rng & Units 07/03/2023    8:18 AM 11/28/2022    3:24 PM 09/16/2022    7:37 AM  CBC  WBC 4.0 - 10.5 K/uL 2.8  3.2  2.7   Hemoglobin 12.0 - 15.0 g/dL 40.9  81.1  91.4   Hematocrit 36.0 - 46.0 % 38.1  38.6  37.0   Platelets 150 - 400 K/uL  232  260  244.0       Latest Ref Rng & Units 07/03/2023    8:18 AM 09/16/2022    7:37 AM 05/14/2022    7:46 AM  CMP  Glucose 70 - 99 mg/dL 960  96  85   BUN 6 - 20 mg/dL 12  14  12    Creatinine 0.44 - 1.00 mg/dL 4.54  0.98  1.19   Sodium 135 - 145 mmol/L 136  137  139    Potassium 3.5 - 5.1 mmol/L 4.2  4.5  4.4   Chloride 98 - 111 mmol/L 101  100  101   CO2 22 - 32 mmol/L 26  29  28    Calcium 8.9 - 10.3 mg/dL 8.7  9.1  9.4   Total Protein 6.5 - 8.1 g/dL 7.1  6.9  7.3   Total Bilirubin 0.0 - 1.2 mg/dL 0.6  0.4  0.4   Alkaline Phos 38 - 126 U/L 73  83  82   AST 15 - 41 U/L 29  25  32   ALT 0 - 44 U/L 26  29  34        RADIOGRAPHIC STUDIES: I have personally reviewed the radiological images as listed and agreed with the findings in the report. No results found.

## 2023-07-10 NOTE — Assessment & Plan Note (Signed)
 Labs are reviewed and discussed with patient. Wbc is stable, improved.  normal CBC with differential; normal  LDH; normal folate and Vitamin B12, negative hepatitis, negative HIV, negative EBV, CMV, ANCA, ANA.   Leukopenia is worse despite decrease of alcohol consumption. Could be due to recent URI. Repeat lab in 1 month Observation.

## 2023-07-28 ENCOUNTER — Ambulatory Visit: Attending: Internal Medicine | Admitting: Occupational Therapy

## 2023-07-28 DIAGNOSIS — M79641 Pain in right hand: Secondary | ICD-10-CM | POA: Diagnosis present

## 2023-07-28 DIAGNOSIS — M25631 Stiffness of right wrist, not elsewhere classified: Secondary | ICD-10-CM | POA: Diagnosis present

## 2023-07-28 DIAGNOSIS — M6281 Muscle weakness (generalized): Secondary | ICD-10-CM | POA: Diagnosis present

## 2023-08-01 ENCOUNTER — Encounter: Payer: Self-pay | Admitting: Occupational Therapy

## 2023-08-01 NOTE — Therapy (Signed)
 OUTPATIENT OCCUPATIONAL THERAPY ORTHO TREATMENT  Patient Name: Adrienne Phillips MRN: [id number] DOB:10-01-1970, 53 y.o., female Today's Date: 08/01/2023  PCP: Dr Glendia REFERRING PROVIDER: Dr Glendia  END OF SESSION:  OT End of Session - 08/01/23 1723     Visit Number 4    Number of Visits 6    Date for OT Re-Evaluation 08/03/23    OT Start Time 1400    OT Stop Time 1445    OT Time Calculation (min) 45 min    Activity Tolerance Patient tolerated treatment well    Behavior During Therapy [facility] for tasks assessed/performed          Past Medical History:  Diagnosis Date   Anemia    BRCA negative 02/2021   MyRisk neg except AXIN2 VUS   Family history of breast cancer    Hypoglycemia    Increased risk of breast cancer 02/2021   IBIS=35.4%/riskscore=26.4%   Iron deficiency    Migraine    Ovarian cyst    Past Surgical History:  Procedure Laterality Date   BREAST CYST ASPIRATION Left 1998   BREAST CYST ASPIRATION Left 09/06/2020   1:00 FNA   Patient Active Problem List   Diagnosis Date Noted   UTI symptoms 05/18/2023   Hand pain 03/23/2023   Hip pain 03/23/2023   Breast cancer screening, high risk patient 03/19/2023   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   Alcohol use 05/29/2022   Joint pain 05/14/2022   History of genetic counseling 07/03/2021   Dysphagia 05/11/2021   Family history of breast cancer 02/12/2021   Persistent cough 01/29/2021   Family history of aneurysm 10/28/2020   Vaginal discharge 02/20/2020   Left sided abdominal pain 02/15/2020   Weight gain 02/15/2020   Vaccine counseling 09/18/2019   Hearing loss 09/18/2019   Headache 07/07/2019   Breast tenderness 07/03/2019   Irregular periods 09/13/2018   Facial droop 04/10/2017   Numbness and tingling of right arm and leg 04/10/2017   Vaginal lesion 12/21/2016   Sinusitis 10/10/2016   Stress 07/18/2016   History of abnormal cervical Pap smear 01/06/2016   Chest  pain 06/30/2015   Left arm numbness 06/27/2015   Neck pain 06/27/2015   Bad odor of urine 11/13/2014   Lower back pain 11/08/2014   History of colonic polyps 03/19/2014   Health care maintenance 03/19/2014   Congestion of nasal sinus 03/19/2014   Barrett's esophagus 03/20/2013   Anemia 02/15/2012   Leukopenia 02/15/2012    ONSET DATE: 3 -4 yrs ago  REFERRING DIAG: R hand pain and weakness  THERAPY DIAG: R wrist stiffness , muscle weakness, R hand pain    Rationale for Evaluation and Treatment: Rehabilitation  SUBJECTIVE:   SUBJECTIVE STATEMENT: See treatment note below for subjective Pt accompanied by: self  PERTINENT HISTORY: Dr Glendia note Pain of right hand 03/19/23 Assessment & Plan: Increased pain/discomfort. Affecting grip, etc. Refer to OT for further evaluation  PRECAUTIONS: None  WEIGHT BEARING RESTRICTIONS: No  PAIN:  Are you having pain? 2/10 dorsal hand ulnar side  FALLS: Has patient fallen in last 6 months? 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  60 hours a week.  She likes to clean and organize the house.  Help other people.SABRA Em work.  Has a dog.  Do crafts.  Making bread.  Do workout do weights at home  PATIENT GOALS:  I just want the pain better and my strength in my right hand  NEXT MD VISIT: ?  OBJECTIVE:  Note: Objective measures were completed at Evaluation unless otherwise noted.  HAND DOMINANCE: Right  ADLs: Pain and discomfort with planks, push-ups , workout with weights, working the yard ,cleaning ,cooking ,work on the computer  FUNCTIONAL OUTCOME MEASURES: Assess next session  UPPER EXTREMITY ROM:     Active ROM Right eval Left eval R 06/19/23 R 07/28/2023  Shoulder flexion      Shoulder abduction      Shoulder adduction      Shoulder extension      Shoulder internal rotation      Shoulder external rotation      Elbow flexion       Elbow extension      Wrist flexion 90  90 5/5 strength 88  Wrist extension 60 4/5 strength  65 pull on dorsal hand - tight volar ulnar wrist and forearm 5/5 strength in plane 68  Wrist ulnar deviation 30  5/5 5/5  Wrist radial deviation 20  5/5 5/5  Wrist pronation 4/5  5/5 5/5  Wrist supination 4/5  5/5 5/5  (Blank rows = not tested)  Active ROM Right eval Left eval  Thumb MCP (0-60)    Thumb IP (0-80)    Thumb Radial abd/add (0-55)     Thumb Palmar abd/add (0-45)     Thumb Opposition to Small Finger     Index MCP (0-90)     Index PIP (0-100)     Index DIP (0-70)      Long MCP (0-90)      Long PIP (0-100)      Long DIP (0-70)      Ring MCP (0-90)      Ring PIP (0-100)      Ring DIP (0-70)      Little MCP (0-90)      Little PIP (0-100)      Little DIP (0-70)      (Active range of motion within functional limits in bilateral hands.  Right second MC 80 and PIP's 100 except fifth digit 90 degrees Patient with hyperextension at 4th and 5th right metacarpals.  More compared to the left. Overcoming using long extensors for decreased wrist extension.  HAND FUNCTION: Grip strength: Right: 62 lbs; Left: 67 lbs, Lateral pinch: Right: 15 lbs, Left: 17 lbs, and 3 point pinch: Right: 15 lbs, Left: 14 lbs 06/19/23 Grip strength: Right: 60 lbs; Left: 65 lbs, Lateral pinch: Right: 17 lbs, Left: 16 lbs, and 3 point pinch: Right: 16 lbs, Left: 14 lbs 07/28/2023 Grip strength 60#  COORDINATION: WNL  SENSATION: Denies any sensory issues  EDEMA: Report increased edema over dorsal hand at the end of the day working  COGNITION: Overall cognitive status: Within functional limits for tasks assessed  TREATMENT DATE: 07/28/23     Pt reports decreased pain overall, no pain at rest today.  She experiences tightness at times during the day especially in the am.   Strength remains in normal limits, grip on right is still unchanged from last session and still decreased compared to left.  Modalities: Fluidotherapy:  Time: 8 Location: Right hand and wrist Decrease stiffness and pain increase motion done prior to review of home exercises  Manual Therapy:  Patient seen for soft tissue massage  with carpal and metacarpal spreads, Use of graston tool Nr2 with sweeping over volar hand, wrist and forearm to decrease adhesions and promote tissue mobility and improved ROM. Foam roller to facilitate soft tissue massage over volar forearm and wrist and hand at home prior to stretches.  Therapeutic Exercises: Tendon glides, AROM of hand Prayer stretch for composite wrist extension hold 5 seconds 10 reps no pressure through the palms. Table slides, 10 reps  Wall push ups, 10 reps with good form and no pain.  Pt has been able to perform planks at home on elbows and progressed to hands without pain.   Added teal putty for grip strengthening for 12 reps for 1-2 sets Pt lacking full extension of small finger, added kinesiotape to small finger applied from crease of DIP over nail and down small finger to give proprioceptive cues for extension.  Issued strips to use intermittently at home to work on DIP extension.     PATIENT EDUCATION: Education details: f progress and HEP  Person educated: Patient Education method: Explanation, Demonstration, Tactile cues, Verbal cues, and Handouts Education comprehension: verbalized understanding, returned demonstration, verbal cues required, and needs further education   GOALS: Goals reviewed with patient? Yes   LONG TERM GOALS: Target date: 12 wks   R Wrist extension range of motion increased to within normal limits for patient to be able to do wall push-ups and transition to floor push-ups and planks symptom-free Baseline:R  Wrist extension 60 degrees with discomfort and pain on floor planks Goal status: INITIAL  2.  Right wrist and  forearm strength increased to 5/5 for patient to use tools, open jars, push and pull heavy door without increase symptoms Baseline: Wrist extension 60 degrees.  With increased weakness and 4/5 strength in supination pronation as well as wrist extension; difficulty maintaining wrist extension with gripping and supination pronation Goal status: INITIAL  3.  Right grip grip strength increased by 5 pounds for patient to open jars and whole tools without increased symptoms Baseline: Grip right 62 and left 67 pounds; right lateral pinch 15 and left 17.  Patient is right-hand dominant. Goal status: INITIAL  4.  Patient be independent in the home program at discharge for maintaining strength as well as maintaining motion without increased symptoms and pain Baseline: No knowledge of home program. Goal status: INITIAL ASSESSMENT:  CLINICAL IMPRESSION: Patient seen for occupational therapy evaluation for right hand pain and weakness.  Patient reports she fractured her hand appear fourth metacarpal shaft about 3 to 4 years ago.  Since then patient with increased discomfort and pain, stiffness as well as increased weakness.  Patient very active working mostly in the computer.  But very active around the house and yard work, has a dog, make bread, do workout with weights, organized and clean home.  Patient today seen for second follow-up visit.  Patient continues to show  decreased wrist extension with increased hyperextension of the 4th and 5th metacarpal.  Patient demonstrates increased wrist and forearm strength but continues to show decreased grip but increase prehension strength to within normal limits compared to left hand. Added kinesiotaping to small finger for facilitation of finger extension at the DIP.  Also added teal putty for strengthening of grip to perform over the next couple weeks and return for remeasure  grip next session. Pt now able to perform wall push ups and able to progress to planks on  hands. Continued progress in all areas.  Patient can benefit from skilled OT services to decrease pain and increase motion and strength without increased symptoms to return to prior level of function  PERFORMANCE DEFICITS: in functional skills including ADLs, IADLs, ROM, strength, pain, flexibility, decreased knowledge of use of DME, and UE functional use,   and psychosocial skills including environmental adaptation and routines and behaviors.   IMPAIRMENTS: are limiting patient from ADLs, IADLs, rest and sleep, play, leisure, and social participation.   COMORBIDITIES: has no other co-morbidities that affects occupational performance. Patient will benefit from skilled OT to address above impairments and improve overall function.  MODIFICATION OR ASSISTANCE TO COMPLETE EVALUATION: No modification of tasks or assist necessary to complete an evaluation.  OT OCCUPATIONAL PROFILE AND HISTORY: Problem focused assessment: Including review of records relating to presenting problem.  CLINICAL DECISION MAKING: LOW - limited treatment options, no task modification necessary  REHAB POTENTIAL: Good for goals  EVALUATION COMPLEXITY: Low   PLAN:  OT FREQUENCY: 1x/week to biweekly  OT DURATION: 12 weeks  PLANNED INTERVENTIONS: 97168 OT Re-evaluation, 97535 self care/ADL training, 02889 therapeutic exercise, 97530 therapeutic activity, 97140 manual therapy, 97018 paraffin, 02960 fluidotherapy, 97034 contrast bath, passive range of motion, patient/family education, and DME and/or AE instructions   CONSULTED AND AGREED WITH PLAN OF CARE: Patient   Lorieann Argueta, OTR/L,CLT 08/01/2023, 5:23 PM

## 2023-08-14 ENCOUNTER — Inpatient Hospital Stay: Attending: Oncology

## 2023-08-14 DIAGNOSIS — C9110 Chronic lymphocytic leukemia of B-cell type not having achieved remission: Secondary | ICD-10-CM | POA: Diagnosis present

## 2023-08-14 DIAGNOSIS — D72820 Lymphocytosis (symptomatic): Secondary | ICD-10-CM

## 2023-08-14 LAB — TECHNOLOGIST SMEAR REVIEW: Plt Morphology: NORMAL

## 2023-08-14 LAB — CBC WITH DIFFERENTIAL (CANCER CENTER ONLY)
Abs Immature Granulocytes: 0.01 K/uL (ref 0.00–0.07)
Basophils Absolute: 0.1 K/uL (ref 0.0–0.1)
Basophils Relative: 2 %
Eosinophils Absolute: 0.1 K/uL (ref 0.0–0.5)
Eosinophils Relative: 2 %
HCT: 38.8 % (ref 36.0–46.0)
Hemoglobin: 12.9 g/dL (ref 12.0–15.0)
Immature Granulocytes: 0 %
Lymphocytes Relative: 21 %
Lymphs Abs: 0.6 K/uL — ABNORMAL LOW (ref 0.7–4.0)
MCH: 29.2 pg (ref 26.0–34.0)
MCHC: 33.2 g/dL (ref 30.0–36.0)
MCV: 87.8 fL (ref 80.0–100.0)
Monocytes Absolute: 0.3 K/uL (ref 0.1–1.0)
Monocytes Relative: 9 %
Neutro Abs: 2 K/uL (ref 1.7–7.7)
Neutrophils Relative %: 65 %
Platelet Count: 258 K/uL (ref 150–400)
RBC: 4.42 MIL/uL (ref 3.87–5.11)
RDW: 13.2 % (ref 11.5–15.5)
Smear Review: NORMAL
WBC Count: 3 K/uL — ABNORMAL LOW (ref 4.0–10.5)
nRBC: 0 % (ref 0.0–0.2)

## 2023-08-14 LAB — FOLATE: Folate: 17.9 ng/mL (ref >5.9)

## 2023-08-18 LAB — COMP PANEL: LEUKEMIA/LYMPHOMA: Immunophenotypic Profile: 0

## 2023-08-20 ENCOUNTER — Ambulatory Visit: Admitting: Occupational Therapy

## 2023-08-28 ENCOUNTER — Ambulatory Visit: Admitting: Nurse Practitioner

## 2023-09-17 ENCOUNTER — Encounter: Payer: Self-pay | Admitting: Internal Medicine

## 2023-09-17 ENCOUNTER — Ambulatory Visit (INDEPENDENT_AMBULATORY_CARE_PROVIDER_SITE_OTHER): Payer: 59 | Admitting: Internal Medicine

## 2023-09-17 VITALS — BP 116/70 | HR 73 | Resp 16 | Ht 67.0 in | Wt 152.8 lb

## 2023-09-17 DIAGNOSIS — E0789 Other specified disorders of thyroid: Secondary | ICD-10-CM

## 2023-09-17 DIAGNOSIS — Z13220 Encounter for screening for lipoid disorders: Secondary | ICD-10-CM | POA: Diagnosis not present

## 2023-09-17 DIAGNOSIS — D649 Anemia, unspecified: Secondary | ICD-10-CM | POA: Diagnosis not present

## 2023-09-17 DIAGNOSIS — F439 Reaction to severe stress, unspecified: Secondary | ICD-10-CM

## 2023-09-17 DIAGNOSIS — Z0000 Encounter for general adult medical examination without abnormal findings: Secondary | ICD-10-CM

## 2023-09-17 DIAGNOSIS — D72819 Decreased white blood cell count, unspecified: Secondary | ICD-10-CM

## 2023-09-17 DIAGNOSIS — M25559 Pain in unspecified hip: Secondary | ICD-10-CM

## 2023-09-17 DIAGNOSIS — F1090 Alcohol use, unspecified, uncomplicated: Secondary | ICD-10-CM

## 2023-09-17 DIAGNOSIS — Z8742 Personal history of other diseases of the female genital tract: Secondary | ICD-10-CM

## 2023-09-17 DIAGNOSIS — Z1239 Encounter for other screening for malignant neoplasm of breast: Secondary | ICD-10-CM

## 2023-09-17 DIAGNOSIS — K2270 Barrett's esophagus without dysplasia: Secondary | ICD-10-CM

## 2023-09-17 DIAGNOSIS — Z1231 Encounter for screening mammogram for malignant neoplasm of breast: Secondary | ICD-10-CM

## 2023-09-17 LAB — CBC WITH DIFFERENTIAL/PLATELET
Basophils Absolute: 0.1 K/uL (ref 0.0–0.1)
Basophils Relative: 2.9 % (ref 0.0–3.0)
Eosinophils Absolute: 0.1 K/uL (ref 0.0–0.7)
Eosinophils Relative: 2.3 % (ref 0.0–5.0)
HCT: 40.6 % (ref 36.0–46.0)
Hemoglobin: 13.4 g/dL (ref 12.0–15.0)
Lymphocytes Relative: 29.6 % (ref 12.0–46.0)
Lymphs Abs: 0.7 K/uL (ref 0.7–4.0)
MCHC: 32.9 g/dL (ref 30.0–36.0)
MCV: 88.6 fl (ref 78.0–100.0)
Monocytes Absolute: 0.3 K/uL (ref 0.1–1.0)
Monocytes Relative: 11.1 % (ref 3.0–12.0)
Neutro Abs: 1.4 K/uL (ref 1.4–7.7)
Neutrophils Relative %: 54.1 % (ref 43.0–77.0)
Platelets: 269 K/uL (ref 150.0–400.0)
RBC: 4.58 Mil/uL (ref 3.87–5.11)
RDW: 13.9 % (ref 11.5–15.5)
WBC: 2.5 K/uL — ABNORMAL LOW (ref 4.0–10.5)

## 2023-09-17 LAB — BASIC METABOLIC PANEL WITH GFR
BUN: 10 mg/dL (ref 6–23)
CO2: 28 meq/L (ref 19–32)
Calcium: 9.1 mg/dL (ref 8.4–10.5)
Chloride: 100 meq/L (ref 96–112)
Creatinine, Ser: 0.69 mg/dL (ref 0.40–1.20)
GFR: 98.68 mL/min (ref >60.00)
Glucose, Bld: 73 mg/dL (ref 70–99)
Potassium: 3.9 meq/L (ref 3.5–5.1)
Sodium: 139 meq/L (ref 135–145)

## 2023-09-17 LAB — HEPATIC FUNCTION PANEL
ALT: 24 U/L (ref 0–35)
AST: 26 U/L (ref 0–37)
Albumin: 4.7 g/dL (ref 3.5–5.2)
Alkaline Phosphatase: 80 U/L (ref 39–117)
Bilirubin, Direct: 0.1 mg/dL (ref 0.0–0.3)
Total Bilirubin: 0.5 mg/dL (ref 0.2–1.2)
Total Protein: 7.8 g/dL (ref 6.0–8.3)

## 2023-09-17 LAB — LIPID PANEL
Cholesterol: 239 mg/dL — ABNORMAL HIGH (ref 0–200)
HDL: 115.9 mg/dL (ref >39.00)
LDL Cholesterol: 113 mg/dL — ABNORMAL HIGH (ref 0–99)
NonHDL: 122.96
Total CHOL/HDL Ratio: 2
Triglycerides: 48 mg/dL (ref 0.0–149.0)
VLDL: 9.6 mg/dL (ref 0.0–40.0)

## 2023-09-17 LAB — TSH: TSH: 1.61 u[IU]/mL (ref 0.35–5.50)

## 2023-09-17 NOTE — Progress Notes (Signed)
 Subjective:    Patient ID: Adrienne Phillips, female    DOB: July 24, 1970, 53 y.o.   MRN: [id number]  Patient here for  Chief Complaint  Patient presents with   Annual Exam    HPI Here for a physical exam. S/p EGD and colonoscopy 11/2021 - three 3-6 mm polyps in the descending colon (tubular adenoma and sessile serrated adenoma) and internal hemorrhoids. EGD - LA Grade A reflux esophagitis, esophageal ulcer, white nummular lesions in the esophageal mucosa. (Candida). Reports increased gas. Worse over the last few days. She is adjusting her diet. Going to start probiotic. With diet adjustment, better today. Will notify me if persistent. Sees Dr Babara for f/u leukopenia and monoclonal B-cell lymphocytosis of undetermined significance. Recommended continued observation. Last visit, referred to OT for right hand pain and PT for hip and knee pain. Had OT. Hand is better. Still with issues with right fifth finger. Overall improved. Had f/u with Dr Babara 07/10/23 - stable. Recommended observation. She request to ger her blood counts checked (and followed) here. Still having issues with left hip. Discussed PT. Was never scheduled. Request referral. Feels like the tissue needs to be stretched. Aggravated when she walks or does zoomba. Is staying active. Exercising.    Past Medical History:  Diagnosis Date   Anemia    BRCA negative 02/2021   MyRisk neg except AXIN2 VUS   Family history of breast cancer    Hypoglycemia    Increased risk of breast cancer 02/2021   IBIS=35.4%/riskscore=26.4%   Iron deficiency    Migraine    Ovarian cyst    Past Surgical History:  Procedure Laterality Date   BREAST CYST ASPIRATION Left 1998   BREAST CYST ASPIRATION Left 09/06/2020   1:00 FNA   Family History  Problem Relation Age of Onset   Hypertension Mother        migraines   Colon cancer Father        75s   Skin cancer Father    Breast cancer Sister 71   Rheum arthritis Brother    Osteoporosis Paternal  Grandmother    Ulcers Paternal Grandfather    Breast cancer Maternal Aunt        53   Breast cancer Paternal Aunt 26   Social History   Socioeconomic History   Marital status: Married    Spouse name: Not on file   Number of children: 0   Years of education: Not on file   Highest education level: Bachelor's degree (e.g., BA, AB, BS)  Occupational History    Employer: [employer]  Tobacco Use   Smoking status: Never   Smokeless tobacco: Never  Vaping Use   Vaping status: Never Used  Substance and Sexual Activity   Alcohol use: Yes    Comment: 2-3 bottles wine per week.   Drug use: No   Sexual activity: Yes  Other Topics Concern   Not on file  Social History Narrative   Not on file   Social Drivers of Health   Financial Resource Strain: Low Risk  (09/16/2023)   Overall Financial Resource Strain (CARDIA)    Difficulty of Paying Living Expenses: Not very hard  Food Insecurity: No Food Insecurity (09/16/2023)   Hunger Vital Sign    Worried About Running Out of Food in the Last Year: Never true    Ran Out of Food in the Last Year: Never true  Transportation Needs: No Transportation Needs (09/16/2023)   PRAPARE - Transportation  Lack of Transportation (Medical): No    Lack of Transportation (Non-Medical): No  Physical Activity: Sufficiently Active (09/16/2023)   Exercise Vital Sign    Days of Exercise per Week: 5 days    Minutes of Exercise per Session: 60 min  Stress: Stress Concern Present (09/16/2023)   [employer] of Occupational Health - Occupational Stress Questionnaire    Feeling of Stress: To some extent  Social Connections: Socially Integrated (09/16/2023)   Social Connection and Isolation Panel    Frequency of Communication with Friends and Family: More than three times a week    Frequency of Social Gatherings with Friends and Family: More than three times a week    Attends Religious Services: More than 4 times per year    Active Member of [employer] or Organizations:  Yes    Attends Engineer, structural: More than 4 times per year    Marital Status: Married     Review of Systems  Constitutional:  Negative for appetite change and unexpected weight change.  HENT:  Negative for congestion, sinus pressure and sore throat.   Eyes:  Negative for pain and visual disturbance.  Respiratory:  Negative for cough, chest tightness and shortness of breath.   Cardiovascular:  Negative for chest pain, palpitations and leg swelling.  Gastrointestinal:  Negative for abdominal pain, diarrhea, nausea and vomiting.  Genitourinary:  Negative for difficulty urinating and dysuria.  Musculoskeletal:  Negative for joint swelling and myalgias.       Persistent hip pain. Hand better.   Skin:  Negative for color change and rash.  Neurological:  Negative for dizziness and headaches.  Hematological:  Negative for adenopathy. Does not bruise/bleed easily.  Psychiatric/Behavioral:  Negative for agitation and dysphoric mood.        Objective:     BP 116/70   Pulse 73   Resp 16   Ht 5' 7 (1.702 m)   Wt 152 lb 12.8 oz (69.3 kg)   SpO2 99%   BMI 23.93 kg/m  Wt Readings from Last 3 Encounters:  09/17/23 152 lb 12.8 oz (69.3 kg)  07/10/23 152 lb 6.4 oz (69.1 kg)  05/18/23 154 lb 6.4 oz (70 kg)    Physical Exam Vitals reviewed.  Constitutional:      General: She is not in acute distress.    Appearance: Normal appearance. She is well-developed.  HENT:     Head: Normocephalic and atraumatic.     Right Ear: External ear normal.     Left Ear: External ear normal.     Mouth/Throat:     Pharynx: No oropharyngeal exudate or posterior oropharyngeal erythema.  Eyes:     General: No scleral icterus.       Right eye: No discharge.        Left eye: No discharge.     Conjunctiva/sclera: Conjunctivae normal.  Neck:     Thyroid : No thyromegaly.     Comments: Thyroid  fullness. No pain to palpation.  Cardiovascular:     Rate and Rhythm: Normal rate and regular  rhythm.  Pulmonary:     Effort: No tachypnea, accessory muscle usage or respiratory distress.     Breath sounds: Normal breath sounds. No decreased breath sounds or wheezing.  Chest:  Breasts:    Right: No inverted nipple, mass, nipple discharge or tenderness (no axillary adenopathy).     Left: No inverted nipple, mass, nipple discharge or tenderness (no axilarry adenopathy).  Abdominal:     General: Bowel sounds are  normal.     Palpations: Abdomen is soft.     Tenderness: There is no abdominal tenderness.  Musculoskeletal:        General: No swelling or tenderness.     Cervical back: Neck supple.  Lymphadenopathy:     Cervical: No cervical adenopathy.  Skin:    Findings: No erythema or rash.  Neurological:     Mental Status: She is alert and oriented to person, place, and time.  Psychiatric:        Mood and Affect: Mood normal.        Behavior: Behavior normal.         Outpatient Encounter Medications as of 09/17/2023  Medication Sig   ALPRAZolam  (XANAX ) 0.25 MG tablet Take 1 tablet (0.25 mg total) by mouth daily as needed for sleep.   ASHWAGANDHA PO    Elderberry 500 MG CAPS    MAGNESIUM GLUCONATE PO Take by mouth.   metaxalone  (SKELAXIN ) 800 MG tablet TAKE 1/2 TO 1 TABLET BY MOUTH AT BEDTIME AS NEEDED FOR BACK SPASMS   Multiple Vitamin (MULTIVITAMIN) capsule Take 1 capsule by mouth daily.   No facility-administered encounter medications on file as of 09/17/2023.     Lab Results  Component Value Date   WBC 2.5 (L) 09/17/2023   HGB 13.4 09/17/2023   HCT 40.6 09/17/2023   PLT 269.0 09/17/2023   GLUCOSE 73 09/17/2023   CHOL 239 (H) 09/17/2023   TRIG 48.0 09/17/2023   HDL 115.90 09/17/2023   LDLCALC 113 (H) 09/17/2023   ALT 24 09/17/2023   AST 26 09/17/2023   NA 139 09/17/2023   K 3.9 09/17/2023   CL 100 09/17/2023   CREATININE 0.69 09/17/2023   BUN 10 09/17/2023   CO2 28 09/17/2023   TSH 1.61 09/17/2023   INR 1.1 05/29/2022   HGBA1C 5.4 11/02/2015    MM  3D SCREENING MAMMOGRAM BILATERAL BREAST Result Date: 08/21/2022 CLINICAL DATA:  Screening. EXAM: DIGITAL SCREENING BILATERAL MAMMOGRAM WITH TOMOSYNTHESIS AND CAD TECHNIQUE: Bilateral screening digital craniocaudal and mediolateral oblique mammograms were obtained. Bilateral screening digital breast tomosynthesis was performed. The images were evaluated with computer-aided detection. COMPARISON:  Previous exam(s). ACR Breast Density Category d: The breasts are extremely dense, which lowers the sensitivity of mammography. FINDINGS: There are no findings suspicious for malignancy. IMPRESSION: No mammographic evidence of malignancy. A result letter of this screening mammogram will be mailed directly to the patient. RECOMMENDATION: Screening mammogram in one year. (Code:SM-B-01Y) BI-RADS CATEGORY  1: Negative. Electronically Signed   By: Delon Music M.D.   On: 08/21/2022 09:25       Assessment & Plan:  Health care maintenance Assessment & Plan: Physical - 09/17/23. PAP 02/12/21 - negative with negative HPV.  Mammogram 08/20/22 - Birads I. Colonoscopy 11/2021 - Hemorrhoids found on perianal exam. Three 3 to 6 mm polyps in the descending colon, in the proximal transverse colon and in the mid transverse colon. Internal hemorrhoids. The examination was otherwise normal. Pathology - Tubular adenoma, negative for high grade dysplasia. 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 Mammogram ordered.    Leukopenia, unspecified type Assessment & Plan: Has been followed by hematology. Request f/u cbc here.   Orders: -     Basic metabolic panel with GFR -     Hepatic function panel -     TSH -     CBC with Differential/Platelet  Screening cholesterol level -     Lipid panel  Anemia, unspecified type Assessment & Plan: EGD  1 - 11/2021 - The histologic findings are compatible with Candida esophagitis.   Colonoscopy 11/2021 - Tubular adenoma, negative for high grade dysplasia. Sessile  serrated adenoma, negative for conventional dysplasia. Follow cbc. Recheck today.   Orders: -     TSH  Hip pain, unspecified laterality Assessment & Plan: Still having issues with left hip. Discussed PT. Was never scheduled. Request referral. Feels like the tissue needs to be stretched. Aggravated when she walks or does zoomba. Is trying to stay active. Continue to exercise.   Orders: -     Ambulatory referral to Physical Therapy  Visit for screening mammogram -     3D Screening Mammogram, Left and Right; Future  Thyroid  fullness Assessment & Plan: Noted on exam. Check thyroid  ultrasound. Check tsh today with labs.   Orders: -     US  THYROID ; Future  Stress Assessment & Plan: Increased work stress. Discussed. Overall appears to be handling things relatively well. No change today. Follow.    History of abnormal cervical Pap smear Assessment & Plan: PAP 02/12/21 - negative with negative hpv.    Breast cancer screening, high risk patient Assessment & Plan: Wants to stop breast MRI and return to yearly mammograms.    Barrett's esophagus without dysplasia Assessment & Plan: 11/2022- EGD - no evidence of Barrett's esophagus, LA Grade A esophagitis, one superficial esophageal ulcer with no bleeding in lower third of esophagus, esophageal candidiasis, normal stomach and duodenum    Alcohol use Assessment & Plan: Continue to decrease alcohol intake.       Allena Hamilton, MD

## 2023-09-17 NOTE — Assessment & Plan Note (Addendum)
 Physical - 09/17/23. PAP 02/12/21 - negative with negative HPV.  Mammogram 08/20/22 - Birads I. Colonoscopy 11/2021 - Hemorrhoids found on perianal exam. Three 3 to 6 mm polyps in the descending colon, in the proximal transverse colon and in the mid transverse colon. Internal hemorrhoids. The examination was otherwise normal. Pathology - Tubular adenoma, negative for high grade dysplasia. 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 Mammogram ordered.

## 2023-09-18 ENCOUNTER — Ambulatory Visit: Payer: Self-pay | Admitting: Internal Medicine

## 2023-09-18 ENCOUNTER — Other Ambulatory Visit: Payer: Self-pay

## 2023-09-18 DIAGNOSIS — R899 Unspecified abnormal finding in specimens from other organs, systems and tissues: Secondary | ICD-10-CM

## 2023-09-25 ENCOUNTER — Ambulatory Visit
Admission: RE | Admit: 2023-09-25 | Discharge: 2023-09-25 | Disposition: A | Discharge status: Home / Self Care | Source: Ambulatory Visit | Attending: Internal Medicine | Admitting: Internal Medicine

## 2023-09-25 DIAGNOSIS — E0789 Other specified disorders of thyroid: Secondary | ICD-10-CM | POA: Diagnosis present

## 2023-09-27 ENCOUNTER — Encounter: Payer: Self-pay | Admitting: Internal Medicine

## 2023-09-27 NOTE — Assessment & Plan Note (Signed)
 Noted on exam. Check thyroid  ultrasound. Check tsh today with labs.

## 2023-09-27 NOTE — Assessment & Plan Note (Signed)
PAP 02/12/21 - negative with negative hpv.  

## 2023-09-27 NOTE — Assessment & Plan Note (Signed)
 Still having issues with left hip. Discussed PT. Was never scheduled. Request referral. Feels like the tissue needs to be stretched. Aggravated when she walks or does zoomba. Is trying to stay active. Continue to exercise.

## 2023-09-27 NOTE — Assessment & Plan Note (Signed)
 Increased work stress. Discussed. Overall appears to be handling things relatively well. No change today. Follow.

## 2023-09-27 NOTE — Assessment & Plan Note (Signed)
 Wants to stop breast MRI and return to yearly mammograms.

## 2023-09-27 NOTE — Assessment & Plan Note (Signed)
 Has been followed by hematology. Request f/u cbc here.

## 2023-09-27 NOTE — Assessment & Plan Note (Signed)
Continue to decrease alcohol intake

## 2023-09-27 NOTE — Assessment & Plan Note (Signed)
 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   Colonoscopy 11/2021 - Tubular adenoma, negative for high grade dysplasia. 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 Follow cbc. Recheck today.

## 2023-09-27 NOTE — Assessment & Plan Note (Signed)
11/2022- EGD - no evidence of Barrett's esophagus, LA Grade A esophagitis, one superficial esophageal ulcer with no bleeding in lower third of esophagus, esophageal candidiasis, normal stomach and duodenum

## 2023-10-07 NOTE — Therapy (Unsigned)
 OUTPATIENT PHYSICAL THERAPY EVALUATION   Patient Name: Adrienne Phillips MRN: [id number] DOB:1970/04/10, 53 y.o., female Today's Date: 10/08/2023  END OF SESSION:  PT End of Session - 10/08/23 1824     Visit Number 1    Number of Visits 17    Date for Recertification  12/31/23    Authorization Type [employer] reporting period from 10/08/2023    Progress Note Due on Visit 10    PT Start Time 1821    PT Stop Time 1930    PT Time Calculation (min) 69 min    Behavior During Therapy [facility] for tasks assessed/performed          Past Medical History:  Diagnosis Date   Anemia    BRCA negative 02/2021   MyRisk neg except AXIN2 VUS   Family history of breast cancer    Hypoglycemia    Increased risk of breast cancer 02/2021   IBIS=35.4%/riskscore=26.4%   Iron deficiency    Migraine    Ovarian cyst    Past Surgical History:  Procedure Laterality Date   BREAST CYST ASPIRATION Left 1998   BREAST CYST ASPIRATION Left 09/06/2020   1:00 FNA   Patient Active Problem List   Diagnosis Date Noted   Thyroid  fullness 09/17/2023   Hand pain 03/23/2023   Hip pain 03/23/2023   Breast cancer screening, high risk patient 03/19/2023   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   Alcohol use 05/29/2022   Joint pain 05/14/2022   History of genetic counseling 07/03/2021   Dysphagia 05/11/2021   Family history of breast cancer 02/12/2021   Family history of aneurysm 10/28/2020   Left sided abdominal pain 02/15/2020   Weight gain 02/15/2020   Vaccine counseling 09/18/2019   Hearing loss 09/18/2019   Headache 07/07/2019   Breast tenderness 07/03/2019   Irregular periods 09/13/2018   Facial droop 04/10/2017   Numbness and tingling of right arm and leg 04/10/2017   Stress 07/18/2016   History of abnormal cervical Pap smear 01/06/2016   Chest pain 06/30/2015   Left arm numbness 06/27/2015   Neck pain 06/27/2015   Bad odor of urine 11/13/2014   Lower back pain  11/08/2014   History of colonic polyps 03/19/2014   Health care maintenance 03/19/2014   Congestion of nasal sinus 03/19/2014   Barrett's esophagus 03/20/2013   Anemia 02/15/2012   Leukopenia 02/15/2012    PCP: Glendia Shad, MD  REFERRING PROVIDER: Glendia Shad, MD  REFERRING DIAG: hip pain, unspecified laterality  Rationale for Evaluation and Treatment: Rehabilitation  THERAPY DIAG:  Radiculopathy, lumbar region  Pain in left hip  ONSET DATE: at least 1.5 years since PT evaluation  SUBJECTIVE:  SUBJECTIVE STATEMENT: What are your expectations for today? Would like to understand what she can do to help the left hip  She state she has been getting massages and that was helpful. She states it feels like the left side needs to be stretched. She also saw a chiro that was not helpful. She states they stretched her once and it felt great and popped, but that was the only time she could get them to do it.    Manner of onset (traumatic, sudden, insidious): she states she was trying to run on a treadmill (first chiro said to stop). She is very active and doesn't stop. She runs, jogs, zumba, or walking, lifting weights, hiking. She thinks onset was gradual.  Related signs and symptoms: weakness depending on what she done in the day. Sometimes tingling in the the left foot. Hip is gnawing pain. When using her standing desk and leaning to the left with her hips, when she goes to move her entire L leg feels slow and weak. Also feels like it has a catch.  Previous episodes: this is episodic but not prior  Occupation: Works at a Libr[employer], academic for [employer] and hobbies: Work out and be active, runs, jogs, zumba, or walking, lifting weights, hiking, gardening. She just  pushes herself through.  24 hour lock: wakes her up (pain is there before moving),  worse towards end of the day. She usually works out in the mornings.  She thinks the worse thing she can do is to stop. They will have to do surgery before she stops. She pushes through all of her activities.   Chiro did say left leg is shorter and that she has stenosis.   PAIN: Are you having pain? Yes NPRS: Current: 6/10,  Best: 4/10, Worst: 9/10. Pain location: left posterior iliac crest, down back of left leg into the bottom of her left foot to the arch and lateral heel.  Pain description: Sometimes tingling in the the left foot. Hip is gnawing pain . Numbness/tingling: tingling in left foot.  Aggravating factors: zumba (foot will go numb, hip will aggravate her and leg won't work), prolonged standing activities, sitting.  Relieving factors: on weekends when doing things other than standing at desk, anything that is different from usual, massage, alcohol, stretching, deep blue, shifting away from the left.    PERTINENT HISTORY:  Patient is a 53 y.o. female who presents to outpatient physical therapy with a referral for medical diagnosis hip pain, unspecified laterality. This patient's chief complaints consist of pain in the left posterior glute radiating down leg to bottom of foot with intermittent tingling and weakness, leading to the following functional deficits: difficulty with usual activities such as sleep, prolonged standing, sitting, working, zumba, gardening, and physical fitness/activities such as running, jogging, yoga, strength training. Relevant past medical history and comorbidities include the following: she has Anemia; Leukopenia; Barrett's esophagus; History of colonic polyps; Health care maintenance; Congestion of nasal sinus; Lower back pain; Bad odor of urine; Left arm numbness; Neck pain; Chest pain; History of abnormal cervical Pap smear; Stress; Facial droop; Numbness and tingling of right  arm and leg; Irregular periods; Breast tenderness; Headache; Vaccine counseling; Hearing loss; Left sided abdominal pain; Weight gain; Family history of aneurysm; Family history of breast cancer; Dysphagia; History of genetic counseling; Joint pain; Alcohol use; Monoclonal B-cell lymphocytosis of undetermined significance; Breast cancer screening, high risk patient; Hand pain; Hip pain; and Thyroid  fullness on their problem list. she  has a past medical history of Anemia, BRCA negative (02/2021), Family history of breast cancer, Hypoglycemia, Increased risk of breast cancer (02/2021), Iron deficiency, Migraine, and Ovarian cyst. she  has a past surgical history that includes Breast cyst aspiration (Left, 1998) and Breast cyst aspiration (Left, 09/06/2020). Patient denies hx of cancer, stroke, lung problems, heart problems, diabetes, unexplained weight loss, unexplained changes in bowel or bladder problems, unexplained stumbling or dropping things, osteoporosis, and spinal surgery. 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    FUNCTIONAL LIMITATIONS: difficulty with usual activities such as sleep, prolonged standing, sitting, working, zumba, gardening, and physical fitness/activities such as running, jogging, yoga, strength training   PRECAUTIONS: None  WEIGHT BEARING RESTRICTIONS: No  FALLS:  Has patient fallen in last 6 months? Yes. Number of falls 1. She was running the back yard to get a ball and her left foot when into a hole and she fell. That was Monday this week.    PLOF: Independent, no limitations due to this left hip/leg pain  PATIENT GOALS: I would love for the pain to go away or at least help my pain   OBJECTIVE  DIAGNOSTIC FINDINGS:  No relevant  SELF-REPORTED FUNCTION Lower Extremity Functional Scale (LEFS): 68 (range 0-80)  STANDING Observation lumbar curve and alignment WNL. Noticed slight rib hump on R with forwards flexion.  AROM Lumbar AROM: Flexion:  fingers to distal tibias, feels fantastic (slight right rib hump) Extension: 100% feels wonderful (feels like she needs to pop hip afterwards).  Rotation:  R = 100%  L = 100% not comfortable Side Flexion: R = 100% feels good L = 75% pinching in left hip  Traction Alleviation Test Positive! In neutral  Standing Lower Myotome Screen Single leg heel raise with hand held support: L weaker and more imbalance than right with pain in left glute.   Walking Regionalization as appropriate Worse with lumbar extension and L sidedbending, relieved with R sidebending and flexion. Changed position with hip ER  L foot on stool better, R foot on stool worse  SITTING Neural Tension Testing Neural Tension Testing Slump Flexion based  R  = 45 degrees (adhesion? no), sensitive to head and foot L  = tension immediately, sensitive to foot, neck extension worse when stretch to about 60 degrees Extension based  R  = 45 degrees, sensitive to foot position only  L  = 30 degrees, sensitive to foot position only  Reflex Testing Patellar Reflex (L3-L4):  R: 2+ L: 3+    Achilles Reflex (S1) R: 1+ L: 0 +  SUPINE Neurological Testing Neural Tension Testing Straight Leg Raise:  From supine R  = positive L  = positive with concordant discomfort Myotome Testing Ankle Eversion (S1) Equal and strong Anterior Tibialis (L4-L5) Maybe a bit weaker on left  Extensor Hallucis Longus (L5) B equal and strong as expected Hamstring (L5) R: 3+/5 L: 3/5    TREATMENT  Manual therapy: to reduce pain and tissue tension, improve range of motion, neuromodulation, in order to promote improved ability to complete functional activities. HOOKLYING Intermittent manual lumbar traction with belt around back of knees 10x10 seconds on/off Better afterwards  Self-Care/Home Management Training: to  educate patient in self care including ADL training, meal preparation, compensatory training, safety procedures/instructions, use of assistive technology devices or adaptive equipment.  Education about mechanical stressors and patient's sensitivyt to load, nerve tension,  L sidebending, possibly extension, localization of symptoms to low back, POC, modifying activities. Provided handouts on load and neural tension sensitivities. Educated about ways to perform self traction at home (pt already doing all of them, so provided guidance on purpose, when, why, and how much).     PATIENT EDUCATION:  Education details: Education about mechanical stressors and patient's sensitivyt to load, nerve tension,  L sidebending, possibly extension, localization of symptoms to low back, POC, modifying activities. Provided handouts on load and neural tension sensitivities.  Person educated: Patient Education method: Explanation, Demonstration, Tactile cues, Verbal cues, and Handouts Education comprehension: verbalized understanding, returned demonstration, and needs further education  HOME EXERCISE PROGRAM: TBD  ASSESSMENT:  CLINICAL IMPRESSION: Patient is a 53 y.o. female referred to outpatient physical therapy with a medical diagnosis of hip pain, unspecified laterality who presents with signs and symptoms consistent with lumbar radiculopathy that is likely causing her L hip pain and leg symptoms. Patient has positive traction alleviation test, nerve tension tests, and has decreased strength on the L during single leg heel raise test than the right. Her pattern of pain and symptoms of weakness and tingling suggest lumbar radiculopathy causing her complaint. Plan to treat for lumbar radiculopathy to resolve hip pain and test further for hip joint pain as appropriate as low back symptoms improves or if there is no improvement. Hip testing is of limited utility in the presence of overlapping radicular symptoms.    Patient presents with significant pain, paresthesia, muscle performance (strength/power/endurance), motor control, nerve tension, balance, ROM, and activity tolerance impairments that are limiting ability to complete usual activities such as sleep, prolonged standing, sitting, working, zumba, gardening, and physical fitness/activities such as running, jogging, yoga, strength training without difficulty. Patient will benefit from skilled physical therapy intervention to address current body structure impairments and activity limitations to improve function and work towards goals set in current POC in order to return to prior level of function or maximal functional improvement.   Mechanical sensitivities: load, L sidebending, extension? Nerve tension   OBJECTIVE IMPAIRMENTS: decreased activity tolerance, decreased balance, decreased coordination, decreased endurance, decreased knowledge of condition, decreased mobility, decreased ROM, decreased strength, increased edema, increased muscle spasms, impaired flexibility, impaired sensation, and pain.   ACTIVITY LIMITATIONS: sitting, standing, and sleeping  PARTICIPATION LIMITATIONS: interpersonal relationship, community activity, and occupation and ability to complete usual activities such as sleep, prolonged standing, sitting, working, zumba, gardening, and physical fitness/activities such as running, jogging, yoga, strength training  PERSONAL FACTORS: Age, Behavior pattern, Past/current experiences, Profession, Time since onset of injury/illness/exacerbation, and 3+ comorbidities:  she has Anemia; Leukopenia; Barrett's esophagus; History of colonic polyps; Health care maintenance; Congestion of nasal sinus; Lower back pain; Bad odor of urine; Left arm numbness; Neck pain; Chest pain; History of abnormal cervical Pap smear; Stress; Facial droop; Numbness and tingling of right arm and leg; Irregular periods; Breast tenderness; Headache; Vaccine counseling;  Hearing loss; Left sided abdominal pain; Weight gain; Family history of aneurysm; Family history of breast cancer; Dysphagia; History of genetic  counseling; Joint pain; Alcohol use; Monoclonal B-cell lymphocytosis of undetermined significance; Breast cancer screening, high risk patient; Hand pain; Hip pain; and Thyroid  fullness on their problem list. she  has a past medical history of Anemia, BRCA negative (02/2021), Family history of breast cancer, Hypoglycemia, Increased risk of breast cancer (02/2021), Iron deficiency, Migraine, and Ovarian cyst. she  has a past surgical history that includes Breast cyst aspiration (Left, 1998) and Breast cyst aspiration (Left, 09/06/2020) are also affecting patient's functional outcome.   REHAB POTENTIAL: Good  CLINICAL DECISION MAKING: Evolving/moderate complexity  EVALUATION COMPLEXITY: Moderate   GOALS: Goals reviewed with patient? No  SHORT TERM GOALS: Target date: 10/22/2023  Patient will be independent with initial home exercise program for self-management of symptoms. Baseline: Initial HEP to be provided at visit 2 as appropriate (10/08/23); Goal status: INITIAL   LONG TERM GOALS: Target date: 12/31/2023  Patient will be independent with a long-term home exercise program for self-management of symptoms.  Baseline: Initial HEP to be provided at visit 2 as appropriate (10/08/23); Goal status: INITIAL  2.  Patient will demonstrate improved Lower Extremity Functional Scale (LEFS) to equal or greater than 77/80 to demonstrate improvement in overall condition and self-reported functional ability.  Baseline: 68/80 (10/08/23); Goal status: INITIAL  3.  Patient will demonstrate the ability to perform her hardest fitness activity (defined by patient) without increased symptoms to improve her ability to complete fitness activities.  Baseline: to be measured at visit 2 as appropriate (10/08/23); Goal status: INITIAL  4.  Patient will demonstrate full  lumbar AROM without increased pain or symptoms to improve her ability to perform yoga and gardening.  Baseline: limited and painful left lumbar flexion and painful extension (10/08/23); Goal status: INITIAL  5.  Patient will demonstrate improvement in Patient Specific Functional Scale (PSFS) of equal or greater than 8/10 points to reflect clinically significant improvement in patient's most valued functional activities. Baseline: to be measured at visit 2 as appropriate (10/08/23); Goal status: INITIAL  6.  Patient will report NPRS equal or less than 3/10 during functional activities during the last 2 weeks to improve their abilitly to complete community, work and/or recreational activities with less limitation. Baseline: 9/10 (10/08/23); Goal status: INITIAL   PLAN:  PT FREQUENCY: 2x/week  PT DURATION: 8-12 weeks  PLANNED INTERVENTIONS: 97164- PT Re-evaluation, 97750- Physical Performance Testing, 97110-Therapeutic exercises, 97530- Therapeutic activity, [id number]- Neuromuscular re-education, 97535- Self Care, [id number]- Manual therapy, [id number]- Electrical stimulation (unattended), 20560 (1-2 muscles), 20561 (3+ muscles)- Dry Needling, Patient/Family education, Cryotherapy, and Moist heat.  PLAN FOR NEXT SESSION: update HEP as appropriate, education on mechanical stressors and modifications of activities to avoid them, recover motor control and awareness of modifiers to mechanical sensitivities, retrain motor patterns, regain ROM, improve strength and resilience needed for  performing desired functional performance with appropriate ROM, strength, power, and endurance. Manual therapy as needed.    Camie SAUNDERS. Juli, PT, DPT, Cert. MDT 10/08/23, 7:44 PM  [facility] [address] [place], [place] [address] P: [phone] I F: [phone]

## 2023-10-08 ENCOUNTER — Ambulatory Visit: Attending: Internal Medicine | Admitting: Physical Therapy

## 2023-10-08 ENCOUNTER — Encounter: Payer: Self-pay | Admitting: Physical Therapy

## 2023-10-08 DIAGNOSIS — M5416 Radiculopathy, lumbar region: Secondary | ICD-10-CM | POA: Insufficient documentation

## 2023-10-08 DIAGNOSIS — M25559 Pain in unspecified hip: Secondary | ICD-10-CM | POA: Insufficient documentation

## 2023-10-08 DIAGNOSIS — M25552 Pain in left hip: Secondary | ICD-10-CM | POA: Insufficient documentation

## 2023-10-12 ENCOUNTER — Ambulatory Visit: Admitting: Physical Therapy

## 2023-10-12 ENCOUNTER — Encounter: Payer: Self-pay | Admitting: Physical Therapy

## 2023-10-12 DIAGNOSIS — M25552 Pain in left hip: Secondary | ICD-10-CM

## 2023-10-12 DIAGNOSIS — M5416 Radiculopathy, lumbar region: Secondary | ICD-10-CM | POA: Diagnosis not present

## 2023-10-12 NOTE — Therapy (Addendum)
 OUTPATIENT PHYSICAL THERAPY TREATMENT   Patient Name: Adrienne Phillips MRN: [id number] DOB:07-08-1970, 53 y.o., female Today's Date: 10/12/2023  END OF SESSION:  PT End of Session - 10/12/23 1737     Visit Number 2    Number of Visits 17    Date for Recertification  12/31/23    Authorization Type [employer] reporting period from 10/08/2023    Progress Note Due on Visit 10    PT Start Time 1643    PT Stop Time 1731    PT Time Calculation (min) 48 min    Activity Tolerance Patient tolerated treatment well    Behavior During Therapy [facility] for tasks assessed/performed           Past Medical History:  Diagnosis Date   Anemia    BRCA negative 02/2021   MyRisk neg except AXIN2 VUS   Family history of breast cancer    Hypoglycemia    Increased risk of breast cancer 02/2021   IBIS=35.4%/riskscore=26.4%   Iron deficiency    Migraine    Ovarian cyst    Past Surgical History:  Procedure Laterality Date   BREAST CYST ASPIRATION Left 1998   BREAST CYST ASPIRATION Left 09/06/2020   1:00 FNA   Patient Active Problem List   Diagnosis Date Noted   Thyroid  fullness 09/17/2023   Hand pain 03/23/2023   Hip pain 03/23/2023   Breast cancer screening, high risk patient 03/19/2023   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   Alcohol use 05/29/2022   Joint pain 05/14/2022   History of genetic counseling 07/03/2021   Dysphagia 05/11/2021   Family history of breast cancer 02/12/2021   Family history of aneurysm 10/28/2020   Left sided abdominal pain 02/15/2020   Weight gain 02/15/2020   Vaccine counseling 09/18/2019   Hearing loss 09/18/2019   Headache 07/07/2019   Breast tenderness 07/03/2019   Irregular periods 09/13/2018   Facial droop 04/10/2017   Numbness and tingling of right arm and leg 04/10/2017   Stress 07/18/2016   History of abnormal cervical Pap smear 01/06/2016   Chest pain 06/30/2015   Left arm numbness 06/27/2015   Neck pain  06/27/2015   Bad odor of urine 11/13/2014   Lower back pain 11/08/2014   History of colonic polyps 03/19/2014   Health care maintenance 03/19/2014   Congestion of nasal sinus 03/19/2014   Barrett's esophagus 03/20/2013   Anemia 02/15/2012   Leukopenia 02/15/2012    PCP: Glendia Shad, MD  REFERRING PROVIDER: Glendia Shad, MD  REFERRING DIAG: hip pain, unspecified laterality  Rationale for Evaluation and Treatment: Rehabilitation  THERAPY DIAG:  Radiculopathy, lumbar region  Pain in left hip  ONSET DATE: at least 1.5 years since PT evaluation  SUBJECTIVE:  PERTINENT HISTORY:  Patient is a 53 y.o. female who presents to outpatient physical therapy with a referral for medical diagnosis hip pain, unspecified laterality. This patient's chief complaints consist of pain in the left posterior glute radiating down leg to bottom of foot with intermittent tingling and weakness, leading to the following functional deficits: difficulty with usual activities such as sleep, prolonged standing, sitting, working, zumba, gardening, and physical fitness/activities such as running, jogging, yoga, strength training. Relevant past medical history and comorbidities include the following: she has Anemia; Leukopenia; Barrett's esophagus; History of colonic polyps; Health care maintenance; Congestion of nasal sinus; Lower back pain; Bad odor of urine; Left arm numbness; Neck pain; Chest pain; History of abnormal cervical Pap smear; Stress; Facial droop; Numbness and tingling of right arm and leg; Irregular periods; Breast tenderness; Headache; Vaccine counseling; Hearing loss; Left sided abdominal pain; Weight gain; Family history of aneurysm; Family history of breast cancer; Dysphagia; History of genetic counseling; Joint  pain; Alcohol use; Monoclonal B-cell lymphocytosis of undetermined significance; Breast cancer screening, high risk patient; Hand pain; Hip pain; and Thyroid  fullness on their problem list. she  has a past medical history of Anemia, BRCA negative (02/2021), Family history of breast cancer, Hypoglycemia, Increased risk of breast cancer (02/2021), Iron deficiency, Migraine, and Ovarian cyst. she  has a past surgical history that includes Breast cyst aspiration (Left, 1998) and Breast cyst aspiration (Left, 09/06/2020). Patient denies hx of cancer, stroke, lung problems, heart problems, diabetes, unexplained weight loss, unexplained changes in bowel or bladder problems, unexplained stumbling or dropping things, osteoporosis, and spinal surgery. 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   Exercise history:  Work out and be active, runs, jogs, zumba, or walking, lifting weights, hiking, gardening. She just pushes herself through.   SUBJECTIVE STATEMENT: Patient states she felt a lot better after last PT session until last night when she went for a 5 mile walk with some friends. She was really better today and yesterday. She really felt the discomfort when she layed down last night. She couldn't get comfortable and took 3 ibuprofen.   Top 3: Zumba (T and Sat), walking, sleeping  PAIN: NPRS: Current: 3/10 left tip of coccyx region, feels over distal anterior thigh and, arch of left foot.   From initial PT evaluation on 10/08/23:  Best: 4/10, Worst: 9/10. Pain location: left posterior iliac crest, down back of left leg into the bottom of her left foot to the arch and lateral heel.  Pain description: Sometimes tingling in the the left foot. Hip is gnawing pain . Numbness/tingling: tingling in left foot.  Aggravating factors: zumba (foot will go numb, hip will aggravate her and leg won't work), prolonged standing activities, sitting.  Relieving factors: on weekends when doing things other than  standing at desk, anything that is different from usual, massage, alcohol, stretching, deep blue, shifting away from the left.    PRECAUTIONS: None  PATIENT GOALS: I would love for the pain to go away or at least help my pain   OBJECTIVE  Repeated Motions Testing Test Movement Symptom During Symptom After Mechanical Response Key Functional Test  REIL 1x15 progressing to hands elevated on yoga blocks decreases better  Better strength and comfort with L step up onto plinth.   REIL 5x10 progressing to hands elevated on yoga blocks decreases Centralized from foot to left glute.   Better with step up.  PROM HIP Hooklying: end range L hip flexion pinches in front of hip (non-concordant). Mild limitation in hip IR bilaterally at 90 degrees flexion, B ER appears WFL.   TREATMENT                                                                                                                         Manual therapy: to reduce pain and tissue tension, improve range of motion, neuromodulation, in order to promote improved ability to complete functional activities. HOOKLYING Intermittent manual lumbar traction with belt around back of knees 11x10 seconds on/off Better afterwards  Hip PROM measurement   Neuromuscular Re-education: a technique or exercise performed with the goal of improving the level of communication between the body and the brain, such as for balance, motor control, muscle activation patterns, coordination, desensitization, quality of muscle contraction, proprioception, and/or kinesthetic sense needed for successful and safe completion of functional activities.  Hooklying and standing pelvic tilt (feels good, good control in static positions, difficulty with walking and zumba)  Self-Care/Home Management Training: to educate patient in self care including ADL training, meal preparation, compensatory training, safety procedures/instructions, use  of assistive technology devices or adaptive equipment.  Education about modifications for walking, zumba, sleeping  Seated postures  Therapeutic exercise: therapeutic exercises that incorporate ONE parameter at one or more areas of the body to centralize symptoms, develop strength and endurance, range of motion, and flexibility required for successful completion of functional activities. Prone press up (REIL) repeated motions testing/treatment (see chart in objective for sets/reps and result).   Therapeutic activities: dynamic therapeutic activities incorporating MULTIPLE parameters or areas of the body designed to achieve improved functional performance. Step ups onto low plinth between sets of REIL to assess result of testing    Pt required multimodal cuing for proper technique and to facilitate improved neuromuscular control, strength, range of motion, and functional ability resulting in improved performance and form.   PATIENT EDUCATION:  Education details: Education about directional preference and avoidance of flexion and sciatic nerve tension. Education about activity modifications.  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 Her pain/symptoms centralized from L foot, anterior thigh, and anterior hip to left top of glute/low back with improved sensation of weakness. Patient provided with HEP  appropriate for her response. Plan to follow up on response and progress/modify as needed. Patient would benefit from continued management of limiting condition by skilled physical therapist to address remaining impairments and functional limitations to work towards stated goals and return to PLOF or maximal  functional independence.   MDT classification: lumbar derangement syndrome with extension preference.  Mechanical sensitivities: load, L sidebending,  Nerve tension  From initial PT evaluation on 10/08/2023:  Patient is a 53 y.o. female referred to outpatient physical therapy with a medical diagnosis of hip pain, unspecified laterality who presents with signs and symptoms consistent with lumbar radiculopathy that is likely causing her L hip pain and leg symptoms. Patient has positive traction alleviation test, nerve tension tests, and has decreased strength on the L during single leg heel raise test than the right. Her pattern of pain and symptoms of weakness and tingling suggest lumbar radiculopathy causing her complaint. Plan to treat for lumbar radiculopathy to resolve hip pain and test further for hip joint pain as appropriate as low back symptoms improves or if there is no improvement. Hip testing is of limited utility in the presence of overlapping radicular symptoms.   Patient presents with significant pain, paresthesia, muscle performance (strength/power/endurance), motor control, nerve tension, balance, ROM, and activity tolerance impairments that are limiting ability to complete usual activities such as sleep, prolonged standing, sitting, working, zumba, gardening, and physical fitness/activities such as running, jogging, yoga, strength training without difficulty. Patient will benefit from skilled physical therapy intervention to address current body structure impairments and activity limitations to improve function and work towards goals set in current POC in order to return to prior level of  function or maximal functional improvement.    OBJECTIVE IMPAIRMENTS: decreased activity tolerance, decreased balance, decreased coordination, decreased endurance, decreased knowledge of condition, decreased mobility, decreased ROM, decreased strength, increased edema, increased muscle spasms, impaired flexibility, impaired sensation, and pain.   ACTIVITY LIMITATIONS: sitting, standing, and sleeping  PARTICIPATION LIMITATIONS: interpersonal relationship, community activity, and occupation and ability to complete usual activities such as sleep, prolonged standing, sitting, working, zumba, gardening, and physical fitness/activities such as running, jogging, yoga, strength training  PERSONAL FACTORS: Age, Behavior pattern, Past/current experiences, Profession, Time since onset of injury/illness/exacerbation, and 3+ comorbidities:  she has Anemia; Leukopenia; Barrett's esophagus; History of colonic polyps; Health care maintenance; Congestion of nasal sinus; Lower back pain; Bad odor of urine; Left arm numbness; Neck pain; Chest pain; History of abnormal cervical Pap smear; Stress; Facial droop; Numbness and tingling of right arm and leg; Irregular periods; Breast tenderness; Headache; Vaccine counseling; Hearing loss; Left sided abdominal pain; Weight gain; Family history of aneurysm; Family history of breast cancer; Dysphagia; History of genetic counseling; Joint pain; Alcohol use; Monoclonal B-cell lymphocytosis of undetermined significance; Breast cancer screening, high risk patient; Hand pain; Hip pain; and Thyroid  fullness on their problem list. she  has a past medical history of Anemia, BRCA negative (02/2021), Family history of breast cancer, Hypoglycemia, Increased risk of breast cancer (02/2021), Iron deficiency, Migraine, and Ovarian cyst. she  has a past surgical history that includes Breast cyst aspiration (Left, 1998) and Breast cyst aspiration (Left, 09/06/2020) are also affecting patient's  functional outcome.   REHAB POTENTIAL: Good  CLINICAL DECISION MAKING: Evolving/moderate complexity  EVALUATION COMPLEXITY: Moderate   GOALS: Goals reviewed with patient? No  SHORT TERM GOALS: Target date: 10/22/2023  Patient will be independent with initial home exercise program for self-management of symptoms. Baseline: Initial HEP to be provided at visit 2 as appropriate (10/08/23); Goal status: In progress   LONG TERM GOALS: Target date: 12/31/2023  Patient will be independent with a long-term home exercise program for self-management of symptoms.  Baseline: Initial HEP to be provided at visit 2 as appropriate (10/08/23); Goal status:  In progress  2.  Patient will demonstrate improved Lower Extremity Functional Scale (LEFS) to equal or greater than 77/80 to demonstrate improvement in overall condition and self-reported functional ability.  Baseline: 68/80 (10/08/23); Goal status: In progress  3.  Patient will demonstrate the ability to perform her hardest fitness activity (defined by patient) without increased symptoms to improve her ability to complete fitness activities.  Baseline: to be measured at visit 2 as appropriate (10/08/23); Goal status: In progress  4.  Patient will demonstrate full lumbar AROM without increased pain or symptoms to improve her ability to perform yoga and gardening.  Baseline: limited and painful left lumbar flexion and painful extension (10/08/23); Goal status: In progress  5.  Patient will demonstrate improvement in Patient Specific Functional Scale (PSFS) of equal or greater than 8/10 points to reflect clinically significant improvement in patient's most valued functional activities. Baseline: to be measured at visit 2 as appropriate (10/08/23); Goal status: In progress  6.  Patient will report NPRS equal or less than 3/10 during functional activities during the last 2 weeks to improve their abilitly to complete community, work and/or  recreational activities with less limitation. Baseline: 9/10 (10/08/23); Goal status: In progress   PLAN:  PT FREQUENCY: 2x/week  PT DURATION: 8-12 weeks  PLANNED INTERVENTIONS: 97164- PT Re-evaluation, 97750- Physical Performance Testing, 97110-Therapeutic exercises, 97530- Therapeutic activity, [id number]- Neuromuscular re-education, 97535- Self Care, [id number]- Manual therapy, [id number]- Electrical stimulation (unattended), 20560 (1-2 muscles), 20561 (3+ muscles)- Dry Needling, Patient/Family education, Cryotherapy, and Moist heat.  PLAN FOR NEXT SESSION: update HEP as appropriate, education on mechanical stressors and modifications of activities to avoid them, recover motor control and awareness of modifiers to mechanical sensitivities, retrain motor patterns, regain ROM, improve strength and resilience needed for  performing desired functional performance with appropriate ROM, strength, power, and endurance. Manual therapy as needed.    Camie SAUNDERS. Juli, PT, DPT, Cert. MDT 10/12/23, 6:40 PM  [facility] [address] [place], [place] [address] P: [phone] I F: [phone]

## 2023-10-14 ENCOUNTER — Ambulatory Visit: Admitting: Physical Therapy

## 2023-10-15 ENCOUNTER — Encounter: Payer: Self-pay | Admitting: Physical Therapy

## 2023-10-15 ENCOUNTER — Ambulatory Visit: Admitting: Physical Therapy

## 2023-10-15 DIAGNOSIS — M5416 Radiculopathy, lumbar region: Secondary | ICD-10-CM

## 2023-10-15 DIAGNOSIS — M25552 Pain in left hip: Secondary | ICD-10-CM

## 2023-10-15 NOTE — Therapy (Signed)
 OUTPATIENT PHYSICAL THERAPY TREATMENT   Patient Name: Adrienne Phillips MRN: [id number] DOB:September 24, 1970, 53 y.o., female Today's Date: 10/15/2023  END OF SESSION:  PT End of Session - 10/15/23 1348     Visit Number 3    Number of Visits 17    Date for Recertification  12/31/23    Authorization Type [employer] reporting period from 10/08/2023    Progress Note Due on Visit 10    PT Start Time 1303    PT Stop Time 1345    PT Time Calculation (min) 42 min    Activity Tolerance Patient tolerated treatment well    Behavior During Therapy [facility] for tasks assessed/performed            Past Medical History:  Diagnosis Date   Anemia    BRCA negative 02/2021   MyRisk neg except AXIN2 VUS   Family history of breast cancer    Hypoglycemia    Increased risk of breast cancer 02/2021   IBIS=35.4%/riskscore=26.4%   Iron deficiency    Migraine    Ovarian cyst    Past Surgical History:  Procedure Laterality Date   BREAST CYST ASPIRATION Left 1998   BREAST CYST ASPIRATION Left 09/06/2020   1:00 FNA   Patient Active Problem List   Diagnosis Date Noted   Thyroid  fullness 09/17/2023   Hand pain 03/23/2023   Hip pain 03/23/2023   Breast cancer screening, high risk patient 03/19/2023   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   Alcohol use 05/29/2022   Joint pain 05/14/2022   History of genetic counseling 07/03/2021   Dysphagia 05/11/2021   Family history of breast cancer 02/12/2021   Family history of aneurysm 10/28/2020   Left sided abdominal pain 02/15/2020   Weight gain 02/15/2020   Vaccine counseling 09/18/2019   Hearing loss 09/18/2019   Headache 07/07/2019   Breast tenderness 07/03/2019   Irregular periods 09/13/2018   Facial droop 04/10/2017   Numbness and tingling of right arm and leg 04/10/2017   Stress 07/18/2016   History of abnormal cervical Pap smear 01/06/2016   Chest pain 06/30/2015   Left arm numbness 06/27/2015   Neck pain  06/27/2015   Bad odor of urine 11/13/2014   Lower back pain 11/08/2014   History of colonic polyps 03/19/2014   Health care maintenance 03/19/2014   Congestion of nasal sinus 03/19/2014   Barrett's esophagus 03/20/2013   Anemia 02/15/2012   Leukopenia 02/15/2012    PCP: Glendia Shad, MD  REFERRING PROVIDER: Glendia Shad, MD  REFERRING DIAG: hip pain, unspecified laterality  Rationale for Evaluation and Treatment: Rehabilitation  THERAPY DIAG:  Radiculopathy, lumbar region  Pain in left hip  ONSET DATE: at least 1.5 years since PT evaluation  SUBJECTIVE:  PERTINENT HISTORY:  Patient is a 53 y.o. female who presents to outpatient physical therapy with a referral for medical diagnosis hip pain, unspecified laterality. This patient's chief complaints consist of pain in the left posterior glute radiating down leg to bottom of foot with intermittent tingling and weakness, leading to the following functional deficits: difficulty with usual activities such as sleep, prolonged standing, sitting, working, zumba, gardening, and physical fitness/activities such as running, jogging, yoga, strength training. Relevant past medical history and comorbidities include the following: she has Anemia; Leukopenia; Barrett's esophagus; History of colonic polyps; Health care maintenance; Congestion of nasal sinus; Lower back pain; Bad odor of urine; Left arm numbness; Neck pain; Chest pain; History of abnormal cervical Pap smear; Stress; Facial droop; Numbness and tingling of right arm and leg; Irregular periods; Breast tenderness; Headache; Vaccine counseling; Hearing loss; Left sided abdominal pain; Weight gain; Family history of aneurysm; Family history of breast cancer; Dysphagia; History of genetic counseling; Joint  pain; Alcohol use; Monoclonal B-cell lymphocytosis of undetermined significance; Breast cancer screening, high risk patient; Hand pain; Hip pain; and Thyroid  fullness on their problem list. she  has a past medical history of Anemia, BRCA negative (02/2021), Family history of breast cancer, Hypoglycemia, Increased risk of breast cancer (02/2021), Iron deficiency, Migraine, and Ovarian cyst. she  has a past surgical history that includes Breast cyst aspiration (Left, 1998) and Breast cyst aspiration (Left, 09/06/2020). Patient denies hx of cancer, stroke, lung problems, heart problems, diabetes, unexplained weight loss, unexplained changes in bowel or bladder problems, unexplained stumbling or dropping things, osteoporosis, and spinal surgery. 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   Exercise history:  Work out and be active, runs, jogs, zumba, or walking, lifting weights, hiking, gardening. She just pushes herself through.   SUBJECTIVE STATEMENT: Patient states she did not do her exercises today because it was a weird day. She did her HEP yesterday. She thinks all in all it is better. She did not do it every 2 hours. She did it at least 6 times each day. It changed how her pain felt when she did it. She has been sitting a lot at the hospital today because her husband had a procedure. She did arms and yoga yesterday. Tuesday she walked with her friends and used shorter steps which felt weird but seemed to help a little).   Top 3 problems: Zumba (T and Sat), walking, sleeping  PAIN: NPRS: Current: 3/10 left SIJ region, left tip of coccyx region, a little in distal left thigh.   From initial PT evaluation on 10/08/23:  Best: 4/10, Worst: 9/10. Pain location: left posterior iliac crest, down back of left leg into the bottom of her left foot to the arch and lateral heel.  Pain description: Sometimes tingling in the the left foot. Hip is gnawing pain . Numbness/tingling: tingling in left foot.   Aggravating factors: zumba (foot will go numb, hip will aggravate her and leg won't work), prolonged standing activities, sitting.  Relieving factors: on weekends when doing things other than standing at desk, anything that is different from usual, massage, alcohol, stretching, deep blue, shifting away from the left.    PRECAUTIONS: None  PATIENT GOALS: I would love for the pain to go away or at least help my pain   OBJECTIVE  ACCESSORY MOTION CPA at L3 and L5 causes numbness down L LE. CPA at L2 and L4 tender but no radiation. Sacrum feels good to pt.   Movement Loss Movement  Loss Symptoms  Flexion mod No change  Extension min No change  Side Gliding R min Feels good  Side Gliding L min Increased pain at left SIJ region    Repeated Motions  Test Movement Symptom During Symptom After Mechanical Response Key Functional Test  REIL 2x10 peripheralizing numbness into the left foot Worse: Pebble in left foot arch, other pain the same.    better: L step up feels stronger  REIL with hands elevated on yoga blocks  and self OP (lock and sag) 1x14 no effect no effect  Better: L step up feels stronger  REIL with hands elevated and clinician OP 1x5  increases pain in foot no worse  no effect  Repeated left lateral glide against the wall with yoga block spacer 2x15 centralizing centralized: foot pain and quad pain gone at first. Still butt pain and pain over L SIJ. After 2nd set it is still the same (but she feels like leg is coming back online     REIL with hips off center to the rigtht decreases centralized to lower butt, less pain over SIJ    REIL with hips off center to the R, with self overpressure (lock and sag) 1x15 Feels like a really good stretch; feels wonderful Warmness on the bottom of foot, thigh absent, buttocks better, SIJ region better     REIL with hips off center to the R, with self overpressure (lock and sag) 2x15 Still feels better Foot: skosh of warmth, thigh: no change,  butt: better, SIJ region: better no effect except less pain Step up better    TREATMENT                                                                                                                          Therapeutic exercise: therapeutic exercises that incorporate ONE parameter at one or more areas of the body to centralize symptoms, develop strength and endurance, range of motion, and flexibility required for successful completion of functional activities. ROM, functional testing and repeated motions testing/interventions as outlined above.   Repeated movements testing/intervention as outlined above (except those with clinician overpressure).   Education on HEP including handout    Manual therapy: to reduce pain and tissue tension, improve range of motion, neuromodulation, in order to promote improved ability to complete functional activities. PRONE  CPA along lumbar spine and sacrum to assess symptom response prior to intervention (see above)  REIL with hands elevated and clinician OP 1x5     Pt required multimodal cuing for proper technique and to facilitate improved neuromuscular control, strength, range of motion, and functional ability resulting in improved performance and form.   PATIENT EDUCATION:  Education details: Education about directional preference and avoidance of flexion and sciatic nerve tension. Education about activity modifications.  Person educated: Patient Education method: Explanation, Demonstration, Tactile cues, Verbal cues, and Handouts Education comprehension: verbalized understanding, returned demonstration, and needs further education  HOME EXERCISE PROGRAM: Access Code: [email or web address] URL: [email or web address]  Date: 10/15/2023 Prepared by: Camie Cleverly  Exercises - Prone Off Center Lumbar Extension Press Up  - 1 sets - 20 reps - 1 second hold - every 2 hours frequency - Seated Correct Posture   ASSESSMENT:  CLINICAL  IMPRESSION: Patient with peripheralization today with extension progression, and with limited improvement with sideglide. She responded best to prone repeated extension in lying with hips off center to the right. She reported feeling better and stronger with less pain and feeling her leg was coming back to life by end of session. Updated HEP to reflect new reductive motion for specific exercise. Plan to re-assess and adjust as needed next visit. Patient would benefit from continued management of limiting condition by skilled physical therapist to address remaining impairments and functional limitations to work towards stated goals and return to PLOF or maximal functional independence.   MDT classification: lumbar derangement syndrome with extension + left sidebending preference.  Mechanical sensitivities: load, Nerve tension  From initial PT evaluation on 10/08/2023:  Patient is a 53 y.o. female referred to outpatient physical therapy with a medical diagnosis of hip pain, unspecified laterality who presents with signs and symptoms consistent with lumbar radiculopathy that is likely causing her L hip pain and leg symptoms. Patient has positive traction alleviation test, nerve tension tests, and has decreased strength on the L during single leg heel raise test than the right. Her pattern of pain and symptoms of weakness and tingling suggest lumbar radiculopathy causing her complaint. Plan to treat for lumbar radiculopathy to resolve hip pain and test further for hip joint pain as appropriate as low back symptoms improves or if there is no improvement. Hip testing is of limited utility in the presence of overlapping radicular symptoms.   Patient presents with significant pain, paresthesia, muscle performance (strength/power/endurance), motor control, nerve tension, balance, ROM, and activity tolerance impairments that are limiting ability to complete usual activities such as sleep, prolonged standing, sitting,  working, zumba, gardening, and physical fitness/activities such as running, jogging, yoga, strength training without difficulty. Patient will benefit from skilled physical therapy intervention to address current body structure impairments and activity limitations to improve function and work towards goals set in current POC in order to return to prior level of function or maximal functional improvement.    OBJECTIVE IMPAIRMENTS: decreased activity tolerance, decreased balance, decreased coordination, decreased endurance, decreased knowledge of condition, decreased mobility, decreased ROM, decreased strength, increased edema, increased muscle spasms, impaired flexibility, impaired sensation, and pain.   ACTIVITY LIMITATIONS: sitting, standing, and sleeping  PARTICIPATION LIMITATIONS: interpersonal relationship, community activity, and occupation and ability to complete usual activities such as sleep, prolonged standing, sitting, working, zumba, gardening, and physical fitness/activities such as running, jogging, yoga, strength training  PERSONAL FACTORS: Age, Behavior pattern, Past/current experiences, Profession, Time since onset of injury/illness/exacerbation, and 3+ comorbidities:  she has Anemia; Leukopenia; Barrett's esophagus; History of colonic polyps; Health care maintenance; Congestion of nasal sinus; Lower back pain; Bad odor of urine; Left arm numbness; Neck pain; Chest pain; History of abnormal cervical Pap smear; Stress; Facial droop; Numbness and tingling of right arm and leg; Irregular periods; Breast tenderness; Headache; Vaccine counseling; Hearing loss; Left sided abdominal pain; Weight gain; Family history of aneurysm; Family history of breast cancer; Dysphagia; History of genetic counseling; Joint pain; Alcohol use; Monoclonal B-cell lymphocytosis of undetermined significance; Breast cancer screening, high risk patient; Hand pain; Hip pain; and Thyroid  fullness on their problem list. she   has a past medical history of Anemia, BRCA  negative (02/2021), Family history of breast cancer, Hypoglycemia, Increased risk of breast cancer (02/2021), Iron deficiency, Migraine, and Ovarian cyst. she  has a past surgical history that includes Breast cyst aspiration (Left, 1998) and Breast cyst aspiration (Left, 09/06/2020) are also affecting patient's functional outcome.   REHAB POTENTIAL: Good  CLINICAL DECISION MAKING: Evolving/moderate complexity  EVALUATION COMPLEXITY: Moderate   GOALS: Goals reviewed with patient? No  SHORT TERM GOALS: Target date: 10/22/2023  Patient will be independent with initial home exercise program for self-management of symptoms. Baseline: Initial HEP to be provided at visit 2 as appropriate (10/08/23); Goal status: In progress   LONG TERM GOALS: Target date: 12/31/2023  Patient will be independent with a long-term home exercise program for self-management of symptoms.  Baseline: Initial HEP to be provided at visit 2 as appropriate (10/08/23); Goal status: In progress  2.  Patient will demonstrate improved Lower Extremity Functional Scale (LEFS) to equal or greater than 77/80 to demonstrate improvement in overall condition and self-reported functional ability.  Baseline: 68/80 (10/08/23); Goal status: In progress  3.  Patient will demonstrate the ability to perform her hardest fitness activity (defined by patient) without increased symptoms to improve her ability to complete fitness activities.  Baseline: to be measured at visit 2 as appropriate (10/08/23); Goal status: In progress  4.  Patient will demonstrate full lumbar AROM without increased pain or symptoms to improve her ability to perform yoga and gardening.  Baseline: limited and painful left lumbar flexion and painful extension (10/08/23); Goal status: In progress  5.  Patient will demonstrate improvement in Patient Specific Functional Scale (PSFS) of equal or greater than 8/10 points to  reflect clinically significant improvement in patient's most valued functional activities. Baseline: to be measured at visit 2 as appropriate (10/08/23); Goal status: In progress  6.  Patient will report NPRS equal or less than 3/10 during functional activities during the last 2 weeks to improve their abilitly to complete community, work and/or recreational activities with less limitation. Baseline: 9/10 (10/08/23); Goal status: In progress   PLAN:  PT FREQUENCY: 2x/week  PT DURATION: 8-12 weeks  PLANNED INTERVENTIONS: 97164- PT Re-evaluation, 97750- Physical Performance Testing, 97110-Therapeutic exercises, 97530- Therapeutic activity, [id number]- Neuromuscular re-education, 97535- Self Care, [id number]- Manual therapy, [id number]- Electrical stimulation (unattended), 20560 (1-2 muscles), 20561 (3+ muscles)- Dry Needling, Patient/Family education, Cryotherapy, and Moist heat.  PLAN FOR NEXT SESSION: update HEP as appropriate, education on mechanical stressors and modifications of activities to avoid them, recover motor control and awareness of modifiers to mechanical sensitivities, retrain motor patterns, regain ROM, improve strength and resilience needed for  performing desired functional performance with appropriate ROM, strength, power, and endurance. Manual therapy as needed.    Camie SAUNDERS. Juli, PT, DPT, Cert. MDT 10/15/23, 8:15 PM  [facility] [address] [place], [place] [address] P: [phone] I F: [phone]

## 2023-10-19 ENCOUNTER — Ambulatory Visit: Admitting: Physical Therapy

## 2023-10-19 ENCOUNTER — Encounter: Payer: Self-pay | Admitting: Physical Therapy

## 2023-10-19 DIAGNOSIS — M5416 Radiculopathy, lumbar region: Secondary | ICD-10-CM

## 2023-10-19 DIAGNOSIS — M25552 Pain in left hip: Secondary | ICD-10-CM

## 2023-10-19 NOTE — Therapy (Signed)
 OUTPATIENT PHYSICAL THERAPY TREATMENT   Patient Name: Adrienne Phillips MRN: [id number] DOB:25-Mar-1970, 53 y.o., female Today's Date: 10/19/2023  END OF SESSION:  PT End of Session - 10/19/23 1647     Visit Number 4    Number of Visits 17    Date for Recertification  12/31/23    Authorization Type [employer] reporting period from 10/08/2023    Progress Note Due on Visit 10    PT Start Time 1647    PT Stop Time 1725    PT Time Calculation (min) 38 min    Activity Tolerance Patient tolerated treatment well    Behavior During Therapy [facility] for tasks assessed/performed           Past Medical History:  Diagnosis Date   Anemia    BRCA negative 02/2021   MyRisk neg except AXIN2 VUS   Family history of breast cancer    Hypoglycemia    Increased risk of breast cancer 02/2021   IBIS=35.4%/riskscore=26.4%   Iron deficiency    Migraine    Ovarian cyst    Past Surgical History:  Procedure Laterality Date   BREAST CYST ASPIRATION Left 1998   BREAST CYST ASPIRATION Left 09/06/2020   1:00 FNA   Patient Active Problem List   Diagnosis Date Noted   Thyroid  fullness 09/17/2023   Hand pain 03/23/2023   Hip pain 03/23/2023   Breast cancer screening, high risk patient 03/19/2023   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   Alcohol use 05/29/2022   Joint pain 05/14/2022   History of genetic counseling 07/03/2021   Dysphagia 05/11/2021   Family history of breast cancer 02/12/2021   Family history of aneurysm 10/28/2020   Left sided abdominal pain 02/15/2020   Weight gain 02/15/2020   Vaccine counseling 09/18/2019   Hearing loss 09/18/2019   Headache 07/07/2019   Breast tenderness 07/03/2019   Irregular periods 09/13/2018   Facial droop 04/10/2017   Numbness and tingling of right arm and leg 04/10/2017   Stress 07/18/2016   History of abnormal cervical Pap smear 01/06/2016   Chest pain 06/30/2015   Left arm numbness 06/27/2015   Neck pain  06/27/2015   Bad odor of urine 11/13/2014   Lower back pain 11/08/2014   History of colonic polyps 03/19/2014   Health care maintenance 03/19/2014   Congestion of nasal sinus 03/19/2014   Barrett's esophagus 03/20/2013   Anemia 02/15/2012   Leukopenia 02/15/2012    PCP: Glendia Shad, MD  REFERRING PROVIDER: Glendia Shad, MD  REFERRING DIAG: hip pain, unspecified laterality  Rationale for Evaluation and Treatment: Rehabilitation  THERAPY DIAG:  Radiculopathy, lumbar region  Pain in left hip  ONSET DATE: at least 1.5 years since PT evaluation  SUBJECTIVE:  PERTINENT HISTORY:  Patient is a 53 y.o. female who presents to outpatient physical therapy with a referral for medical diagnosis hip pain, unspecified laterality. This patient's chief complaints consist of pain in the left posterior glute radiating down leg to bottom of foot with intermittent tingling and weakness, leading to the following functional deficits: difficulty with usual activities such as sleep, prolonged standing, sitting, working, zumba, gardening, and physical fitness/activities such as running, jogging, yoga, strength training. Relevant past medical history and comorbidities include the following: she has Anemia; Leukopenia; Barrett's esophagus; History of colonic polyps; Health care maintenance; Congestion of nasal sinus; Lower back pain; Bad odor of urine; Left arm numbness; Neck pain; Chest pain; History of abnormal cervical Pap smear; Stress; Facial droop; Numbness and tingling of right arm and leg; Irregular periods; Breast tenderness; Headache; Vaccine counseling; Hearing loss; Left sided abdominal pain; Weight gain; Family history of aneurysm; Family history of breast cancer; Dysphagia; History of genetic counseling; Joint  pain; Alcohol use; Monoclonal B-cell lymphocytosis of undetermined significance; Breast cancer screening, high risk patient; Hand pain; Hip pain; and Thyroid  fullness on their problem list. she  has a past medical history of Anemia, BRCA negative (02/2021), Family history of breast cancer, Hypoglycemia, Increased risk of breast cancer (02/2021), Iron deficiency, Migraine, and Ovarian cyst. she  has a past surgical history that includes Breast cyst aspiration (Left, 1998) and Breast cyst aspiration (Left, 09/06/2020). Patient denies hx of cancer, stroke, lung problems, heart problems, diabetes, unexplained weight loss, unexplained changes in bowel or bladder problems, unexplained stumbling or dropping things, osteoporosis, and spinal surgery. 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   Exercise history:  Work out and be active, runs, jogs, zumba, or walking, lifting weights, hiking, gardening. She just pushes herself through.   SUBJECTIVE STATEMENT: Patient states she ran this morning and it seemed to better. Her left leg did not feel weak while running. She had no change in pain while running. She did the prone press up before and the distraction before and after. Then she got on the sauna and stretched including her hamstrings.  She thinks the heel pain is from running. She felt pretty good last time. She did her press ups with hips off center and lateral glide on the wall. It continued to feel really good. She did not do zumba on Saturday (has not done in a week), sleeping still problems, has not walked since last Tuesday.   Top 3 problems: Zumba (T and Sat), walking, sleeping.   PAIN: NPRS: Current: 2/10 left SIJ region, left distal left thigh (she feels a knot there), and pain in the the arch of the left foot and the heel.   From initial PT evaluation on 10/08/23:  Best: 4/10, Worst: 9/10. Pain location: left posterior iliac crest, down back of left leg into the bottom of her left foot to  the arch and lateral heel.  Pain description: Sometimes tingling in the the left foot. Hip is gnawing pain . Numbness/tingling: tingling in left foot.  Aggravating factors: zumba (foot will go numb, hip will aggravate her and leg won't work), prolonged standing activities, sitting.  Relieving factors: on weekends when doing things other than standing at desk, anything that is different from usual, massage, alcohol, stretching, deep blue, shifting away from the left.    PRECAUTIONS: None  PATIENT GOALS: I would love for the pain to go away or at least help my pain   OBJECTIVE  ACCESSORY MOTION CPA at L3  and L5 causes numbness down L LE. CPA at L2 and L4 tender but no radiation. Sacrum feels good to pt.    NEUROLOGIC Neural Tension Testing Slump Flexion based  R  = 20 degrees  L  = 70 degrees pain behind knee Extension based  R = 20 degrees L = 70 degrees pain behind knee Reflex Testing (seated) Patellar Reflex (L3-L4):  R: 1+ L: 2+    Achilles Reflex (S1) R: 0+ L: 0 +  Movement Loss Movement Loss Symptoms  Flexion min No change  Extension min No change  Side Gliding R min Feels good  Side Gliding L min Tightness in L low back    Repeated Motions  Test Movement Symptom During Symptom After Mechanical Response Key Functional Test  REIL with hips off center to the R, with self overpressure (lock and sag) 2x15 peripheralizing numbness into the left foot No worse   Right side glide felt better                       TREATMENT                                                                                                                          Therapeutic exercise: therapeutic exercises that incorporate ONE parameter at one or more areas of the body to centralize symptoms, develop strength and endurance, range of motion, and flexibility required for successful completion of functional activities. Lumbar ROM and nerve tension/DTR testing before/after interventions to  assess response  Repeated movements as outlined above   Half kneeling psoas stretch with knee on yoga mat for comfort 3x45 seconds on the left  Heavy cuing for pelvic position at first  Added to HEP  Neuromuscular Re-education: a technique or exercise performed with the goal of improving the level of communication between the body and the brain, such as for balance, motor control, muscle activation patterns, coordination, desensitization, quality of muscle contraction, proprioception, and/or kinesthetic sense needed for successful and safe completion of functional activities.   Quadruped multifidi hip hikes with one knee on folded towel 1x5 each side with 5 second hold Cuing for TrA and pelvic floor contraction during motion  Added to HEP  Manual therapy: to reduce pain and tissue tension, improve range of motion, neuromodulation, in order to promote improved ability to complete functional activities. HOOKLYING Intermittent manual lumbar traction with belt around back of knees 10x10 seconds on/off  SIDELYING STM with sustained hold to B QL (very uncomfortable, L > R).  Accessory assessment of thoracic spine (stiff)  PRONE Psoas muscle assessment: R normal, L restricted  Self-Care/Home Management Training: to educate patient in self care including ADL training, meal preparation, compensatory training, safety procedures/instructions, use of assistive technology devices or adaptive equipment.  Education on load sensitivity enural tension sensitivity and need to stop stretching hamstrings and running for now, but okay to stretch adductors and hip flexors and do incline walking.  Pt required multimodal cuing for proper technique and to facilitate improved neuromuscular control, strength, range of motion, and functional ability resulting in improved performance and form.   PATIENT EDUCATION:  Education details: Education about directional preference and avoidance of flexion and sciatic  nerve tension. Education about activity modifications.  Person educated: Patient Education method: Explanation, Demonstration, Tactile cues, Verbal cues, and Handouts Education comprehension: verbalized understanding, returned demonstration, and needs further education  HOME EXERCISE PROGRAM: Access Code: [email or web address] URL: [email or web address] Date: 10/19/2023 Prepared by: Camie Cleverly  Exercises - Prone Off Center Lumbar Extension Press Up  - 1 sets - 20 reps - 1 second hold - every 2 hours frequency - Seated Correct Posture  - Half Kneeling Hip Flexor Stretch  - 1 x daily - 1 sets - 3 reps - 45 seconds hold - Quadruped Hip Hike on Foam  - 1 x daily - 3 sets - 5 reps - 5 seconds hold - 1.5 minutes time practicing per set  ASSESSMENT:  CLINICAL IMPRESSION: Patient with mild improvement with prone press up with hips off center, but again did not perform them at required frequency to be effective. Moved away from treatment with repeated motions due to pt not being able to perform HEP as prescribed and likely will not make progress that way. Patient with very tender QL on L > R but DTRs better by nearly one grade at patellar and achilles following manual traction and STM to QL. L psoas found to be tighter than R, so self-psoas stretch added to HEP with good tolerance. Multifidi appeared to be firing well but started with quadruped  hip hikes since it was challenging to her and starts training motor control.  It was especially challenging with L hip supporting, which matches that this is the side that is most affected by her condition. Patient reported improvement by the end of the session. Pain in left quad and left hip region gone by end of session. Still feels it a little in the L arch but not as bad as arrival. Patient would benefit from continued management of limiting condition by skilled physical therapist to address remaining impairments and functional limitations to work towards  stated goals and return to PLOF or maximal functional independence.   MDT classification: lumbar derangement syndrome with extension + left sidebending preference.  Mechanical sensitivities: load, Nerve tension  From initial PT evaluation on 10/08/2023:  Patient is a 53 y.o. female referred to outpatient physical therapy with a medical diagnosis of hip pain, unspecified laterality who presents with signs and symptoms consistent with lumbar radiculopathy that is likely causing her L hip pain and leg symptoms. Patient has positive traction alleviation test, nerve tension tests, and has decreased strength on the L during single leg heel raise test than the right. Her pattern of pain and symptoms of weakness and tingling suggest lumbar radiculopathy causing her complaint. Plan to treat for lumbar radiculopathy to resolve hip pain and test further for hip joint pain as appropriate as low back symptoms improves or if there is no improvement. Hip testing is of limited utility in the presence of overlapping radicular symptoms.   Patient presents with significant pain, paresthesia, muscle performance (strength/power/endurance), motor control, nerve tension, balance, ROM, and activity tolerance impairments that are limiting ability to complete usual activities such as sleep, prolonged standing, sitting, working, zumba, gardening, and physical fitness/activities such as running, jogging, yoga, strength training without difficulty. Patient will benefit from skilled physical therapy intervention to address current body  structure impairments and activity limitations to improve function and work towards goals set in current POC in order to return to prior level of function or maximal functional improvement.    OBJECTIVE IMPAIRMENTS: decreased activity tolerance, decreased balance, decreased coordination, decreased endurance, decreased knowledge of condition, decreased mobility, decreased ROM, decreased strength, increased  edema, increased muscle spasms, impaired flexibility, impaired sensation, and pain.   ACTIVITY LIMITATIONS: sitting, standing, and sleeping  PARTICIPATION LIMITATIONS: interpersonal relationship, community activity, and occupation and ability to complete usual activities such as sleep, prolonged standing, sitting, working, zumba, gardening, and physical fitness/activities such as running, jogging, yoga, strength training  PERSONAL FACTORS: Age, Behavior pattern, Past/current experiences, Profession, Time since onset of injury/illness/exacerbation, and 3+ comorbidities:  she has Anemia; Leukopenia; Barrett's esophagus; History of colonic polyps; Health care maintenance; Congestion of nasal sinus; Lower back pain; Bad odor of urine; Left arm numbness; Neck pain; Chest pain; History of abnormal cervical Pap smear; Stress; Facial droop; Numbness and tingling of right arm and leg; Irregular periods; Breast tenderness; Headache; Vaccine counseling; Hearing loss; Left sided abdominal pain; Weight gain; Family history of aneurysm; Family history of breast cancer; Dysphagia; History of genetic counseling; Joint pain; Alcohol use; Monoclonal B-cell lymphocytosis of undetermined significance; Breast cancer screening, high risk patient; Hand pain; Hip pain; and Thyroid  fullness on their problem list. she  has a past medical history of Anemia, BRCA negative (02/2021), Family history of breast cancer, Hypoglycemia, Increased risk of breast cancer (02/2021), Iron deficiency, Migraine, and Ovarian cyst. she  has a past surgical history that includes Breast cyst aspiration (Left, 1998) and Breast cyst aspiration (Left, 09/06/2020) are also affecting patient's functional outcome.   REHAB POTENTIAL: Good  CLINICAL DECISION MAKING: Evolving/moderate complexity  EVALUATION COMPLEXITY: Moderate   GOALS: Goals reviewed with patient? No  SHORT TERM GOALS: Target date: 10/22/2023  Patient will be independent with initial  home exercise program for self-management of symptoms. Baseline: Initial HEP to be provided at visit 2 as appropriate (10/08/23); Goal status: In progress   LONG TERM GOALS: Target date: 12/31/2023  Patient will be independent with a long-term home exercise program for self-management of symptoms.  Baseline: Initial HEP to be provided at visit 2 as appropriate (10/08/23); Goal status: In progress  2.  Patient will demonstrate improved Lower Extremity Functional Scale (LEFS) to equal or greater than 77/80 to demonstrate improvement in overall condition and self-reported functional ability.  Baseline: 68/80 (10/08/23); Goal status: In progress  3.  Patient will demonstrate the ability to perform her hardest fitness activity (defined by patient) without increased symptoms to improve her ability to complete fitness activities.  Baseline: to be measured at visit 2 as appropriate (10/08/23); Goal status: In progress  4.  Patient will demonstrate full lumbar AROM without increased pain or symptoms to improve her ability to perform yoga and gardening.  Baseline: limited and painful left lumbar flexion and painful extension (10/08/23); Goal status: In progress  5.  Patient will demonstrate improvement in Patient Specific Functional Scale (PSFS) of equal or greater than 8/10 points to reflect clinically significant improvement in patient's most valued functional activities. Baseline: to be measured at visit 2 as appropriate (10/08/23); Goal status: In progress  6.  Patient will report NPRS equal or less than 3/10 during functional activities during the last 2 weeks to improve their abilitly to complete community, work and/or recreational activities with less limitation. Baseline: 9/10 (10/08/23); Goal status: In progress   PLAN:  PT FREQUENCY: 2x/week  PT DURATION:  8-12 weeks  PLANNED INTERVENTIONS: 97164- PT Re-evaluation, 97750- Physical Performance Testing, 97110-Therapeutic exercises,  97530- Therapeutic activity, [id number]- Neuromuscular re-education, 97535- Self Care, 02859- Manual therapy, [id number]- Electrical stimulation (unattended), 20560 (1-2 muscles), 20561 (3+ muscles)- Dry Needling, Patient/Family education, Cryotherapy, and Moist heat.  PLAN FOR NEXT SESSION: update HEP as appropriate, education on mechanical stressors and modifications of activities to avoid them, recover motor control and awareness of modifiers to mechanical sensitivities, retrain motor patterns, regain ROM, improve strength and resilience needed for  performing desired functional performance with appropriate ROM, strength, power, and endurance. Manual therapy as needed.    Camie SAUNDERS. Juli, PT, DPT, Cert. MDT 10/19/23, 6:03 PM  [facility] [address] [place], [place] [address] P: [phone] I F: [phone]

## 2023-10-20 ENCOUNTER — Ambulatory Visit: Admitting: Physical Therapy

## 2023-10-22 ENCOUNTER — Ambulatory Visit: Attending: Internal Medicine | Admitting: Physical Therapy

## 2023-10-22 ENCOUNTER — Encounter: Payer: Self-pay | Admitting: Physical Therapy

## 2023-10-22 DIAGNOSIS — M6281 Muscle weakness (generalized): Secondary | ICD-10-CM | POA: Insufficient documentation

## 2023-10-22 DIAGNOSIS — M25552 Pain in left hip: Secondary | ICD-10-CM | POA: Insufficient documentation

## 2023-10-22 DIAGNOSIS — M5416 Radiculopathy, lumbar region: Secondary | ICD-10-CM | POA: Diagnosis present

## 2023-10-22 NOTE — Therapy (Signed)
 OUTPATIENT PHYSICAL THERAPY TREATMENT   Patient Name: Adrienne Phillips MRN: [id number] DOB:[birth year], 53 y.o., female Today's Date: 10/22/2023  END OF SESSION:  PT End of Session - 10/22/23 1429     Visit Number 5    Number of Visits 17    Date for Recertification  12/31/23    Authorization Type [employer] reporting period from 10/08/2023    Progress Note Due on Visit 10    PT Start Time 1430    PT Stop Time 1513    PT Time Calculation (min) 43 min    Activity Tolerance Patient tolerated treatment well    Behavior During Therapy [facility] for tasks assessed/performed           Past Medical History:  Diagnosis Date   Anemia    BRCA negative 02/2021   MyRisk neg except AXIN2 VUS   Family history of breast cancer    Hypoglycemia    Increased risk of breast cancer 02/2021   IBIS=35.4%/riskscore=26.4%   Iron deficiency    Migraine    Ovarian cyst    Past Surgical History:  Procedure Laterality Date   BREAST CYST ASPIRATION Left 1998   BREAST CYST ASPIRATION Left 09/06/2020   1:00 FNA   Patient Active Problem List   Diagnosis Date Noted   Thyroid  fullness 09/17/2023   Hand pain 03/23/2023   Hip pain 03/23/2023   Breast cancer screening, high risk patient 03/19/2023   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   Alcohol use 05/29/2022   Joint pain 05/14/2022   History of genetic counseling 07/03/2021   Dysphagia 05/11/2021   Family history of breast cancer 02/12/2021   Family history of aneurysm 10/28/2020   Left sided abdominal pain 02/15/2020   Weight gain 02/15/2020   Vaccine counseling 09/18/2019   Hearing loss 09/18/2019   Headache 07/07/2019   Breast tenderness 07/03/2019   Irregular periods 09/13/2018   Facial droop 04/10/2017   Numbness and tingling of right arm and leg 04/10/2017   Stress 07/18/2016   History of abnormal cervical Pap smear 01/06/2016   Chest pain 06/30/2015   Left arm numbness 06/27/2015   Neck pain  06/27/2015   Bad odor of urine 11/13/2014   Lower back pain 11/08/2014   History of colonic polyps 03/19/2014   Health care maintenance 03/19/2014   Congestion of nasal sinus 03/19/2014   Barrett's esophagus 03/20/2013   Anemia 02/15/2012   Leukopenia 02/15/2012    PCP: Glendia Shad, MD  REFERRING PROVIDER: Glendia Shad, MD  REFERRING DIAG: hip pain, unspecified laterality  Rationale for Evaluation and Treatment: Rehabilitation  THERAPY DIAG:  Radiculopathy, lumbar region  Pain in left hip  Muscle weakness (generalized)  ONSET DATE: at least 1.5 years since PT evaluation  SUBJECTIVE:  PERTINENT HISTORY:  Patient is a 53 y.o. female who presents to outpatient physical therapy with a referral for medical diagnosis hip pain, unspecified laterality. This patient's chief complaints consist of pain in the left posterior glute radiating down leg to bottom of foot with intermittent tingling and weakness, leading to the following functional deficits: difficulty with usual activities such as sleep, prolonged standing, sitting, working, zumba, gardening, and physical fitness/activities such as running, jogging, yoga, strength training. Relevant past medical history and comorbidities include the following: she has Anemia; Leukopenia; Barrett's esophagus; History of colonic polyps; Health care maintenance; Congestion of nasal sinus; Lower back pain; Bad odor of urine; Left arm numbness; Neck pain; Chest pain; History of abnormal cervical Pap smear; Stress; Facial droop; Numbness and tingling of right arm and leg; Irregular periods; Breast tenderness; Headache; Vaccine counseling; Hearing loss; Left sided abdominal pain; Weight gain; Family history of aneurysm; Family history of breast cancer; Dysphagia; History  of genetic counseling; Joint pain; Alcohol use; Monoclonal B-cell lymphocytosis of undetermined significance; Breast cancer screening, high risk patient; Hand pain; Hip pain; and Thyroid  fullness on their problem list. she  has a past medical history of Anemia, BRCA negative (02/2021), Family history of breast cancer, Hypoglycemia, Increased risk of breast cancer (02/2021), Iron deficiency, Migraine, and Ovarian cyst. she  has a past surgical history that includes Breast cyst aspiration (Left, 1998) and Breast cyst aspiration (Left, 09/06/2020). Patient denies hx of cancer, stroke, lung problems, heart problems, diabetes, unexplained weight loss, unexplained changes in bowel or bladder problems, unexplained stumbling or dropping things, osteoporosis, and spinal surgery. 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   Exercise history:  Work out and be active, runs, jogs, zumba, or walking, lifting weights, hiking, gardening. She just pushes herself through.   SUBJECTIVE STATEMENT: Patients her left leg feels pretty good today. She does not feel anything in the arch of her left foot or her quad. She feels left lower rib pain from leaning a lot today while standing at work. She has no pain currently. She states her HEP is going well. She is doing her TM on an incline.   Top 3 problems: Weights (Does not feel like she can push herself because she is worried she will mess something up), unsure about Zumba (she hasn't done it)  and going up hills in the yard (leg feels a little weak initially).   PAIN: NPRS: Current: 2/10 left SIJ region, left distal left thigh (she feels a knot there), and pain in the the arch of the left foot and the heel.   From initial PT evaluation on 10/08/23:  Best: 4/10, Worst: 9/10. Pain location: left posterior iliac crest, down back of left leg into the bottom of her left foot to the arch and lateral heel.  Pain description: Sometimes tingling in the the left foot. Hip is  gnawing pain . Numbness/tingling: tingling in left foot.  Aggravating factors: zumba (foot will go numb, hip will aggravate her and leg won't work), prolonged standing activities, sitting.  Relieving factors: on weekends when doing things other than standing at desk, anything that is different from usual, massage, alcohol, stretching, deep blue, shifting away from the left.    PRECAUTIONS: None  PATIENT GOALS: I would love for the pain to go away or at least help my pain   OBJECTIVE  TREATMENT  Manual therapy: to reduce pain and tissue tension, improve range of motion, neuromodulation, in order to promote improved ability to complete functional activities. HOOKLYING Intermittent manual lumbar traction with belt around back of knees 10x10 seconds on/off  Therapeutic exercise: therapeutic exercises that incorporate ONE parameter at one or more areas of the body to centralize symptoms, develop strength and endurance, range of motion, and flexibility required for successful completion of functional activities.  Half kneeling psoas stretch with knee on yoga mat for comfort 2x45 seconds on each side cuing for pelvic position at first   Feet elevated self-mobilization at thoracic spine with Yoga tune up peanut 1x5 breaths each level from approx T8 to top of thoracic spine.  Provided LAX peanut for home use  Sidelying knees to chest thoracic rotation with cervical spine rotation with hand on chest, actively retracting scapula for active stretch of thoracic spine.  1x90 seconds each side rotating with exhale sensation in left ball of foot afterwards  Neuromuscular Re-education: a technique or exercise performed with the goal of improving the level of communication between the body and the brain, such as for balance, motor control, muscle activation patterns, coordination,  desensitization, quality of muscle contraction, proprioception, and/or kinesthetic sense needed for successful and safe completion of functional activities.   Quadruped multifidi hip hikes with one knee on folded towel 1x5 each side with 5 second hold Cuing for TrA and pelvic floor contraction during motion  Removed from HEP  Quadruped Alternating hip extension with neutral to slight PPT, stepwise with knee lift toe slide to knee extension before/after lift off. No shifting.  1x5 each side with 5 second hold Cuing for TrA and pelvic floor contraction during motion   Added to HEP  Pt required multimodal cuing for proper technique and to facilitate improved neuromuscular control, strength, range of motion, and functional ability resulting in improved performance and form.   PATIENT EDUCATION:  Education details: Education about directional preference and avoidance of flexion and sciatic nerve tension. Education about activity modifications.  Person educated: Patient Education method: Explanation, Demonstration, Tactile cues, Verbal cues, and Handouts Education comprehension: verbalized understanding, returned demonstration, and needs further education  HOME EXERCISE PROGRAM: Access Code: [email or web address] URL: [email or web address] Date: 10/19/2023 Prepared by: Camie Cleverly  Exercises - Prone Off Center Lumbar Extension Press Up  - 1 sets - 20 reps - 1 second hold - every 2 hours frequency - Seated Correct Posture  - Half Kneeling Hip Flexor Stretch  - 1 x daily - 1 sets - 3 reps - 45 seconds hold - Quadruped Hip Hike on Foam  - 1 x daily - 3 sets - 5 reps - 5 seconds hold - 1.5 minutes time practicing per set  ASSESSMENT:  CLINICAL IMPRESSION: Patient with improvement in L LE symptoms since last PT session. Continued with interventions for decreased compressive load, added thoracic mobility, and progressed motor control. Patient tolerated well with increase in symptoms by end  of session. She had brief sensation in L ball of foot after rotation exercise but it resolved by end of session. Patient would benefit from continued management of limiting condition by skilled physical therapist to address remaining impairments and functional limitations to work towards stated goals and return to PLOF or maximal functional independence.   MDT classification: lumbar derangement syndrome with extension + left sidebending preference.  Mechanical sensitivities: load, Nerve tension  From initial PT evaluation on 10/08/2023:  Patient is a 53 y.o. female referred to outpatient physical therapy with a  medical diagnosis of hip pain, unspecified laterality who presents with signs and symptoms consistent with lumbar radiculopathy that is likely causing her L hip pain and leg symptoms. Patient has positive traction alleviation test, nerve tension tests, and has decreased strength on the L during single leg heel raise test than the right. Her pattern of pain and symptoms of weakness and tingling suggest lumbar radiculopathy causing her complaint. Plan to treat for lumbar radiculopathy to resolve hip pain and test further for hip joint pain as appropriate as low back symptoms improves or if there is no improvement. Hip testing is of limited utility in the presence of overlapping radicular symptoms.   Patient presents with significant pain, paresthesia, muscle performance (strength/power/endurance), motor control, nerve tension, balance, ROM, and activity tolerance impairments that are limiting ability to complete usual activities such as sleep, prolonged standing, sitting, working, zumba, gardening, and physical fitness/activities such as running, jogging, yoga, strength training without difficulty. Patient will benefit from skilled physical therapy intervention to address current body structure impairments and activity limitations to improve function and work towards goals set in current POC in order to  return to prior level of function or maximal functional improvement.    OBJECTIVE IMPAIRMENTS: decreased activity tolerance, decreased balance, decreased coordination, decreased endurance, decreased knowledge of condition, decreased mobility, decreased ROM, decreased strength, increased edema, increased muscle spasms, impaired flexibility, impaired sensation, and pain.   ACTIVITY LIMITATIONS: sitting, standing, and sleeping  PARTICIPATION LIMITATIONS: interpersonal relationship, community activity, and occupation and ability to complete usual activities such as sleep, prolonged standing, sitting, working, zumba, gardening, and physical fitness/activities such as running, jogging, yoga, strength training  PERSONAL FACTORS: Age, Behavior pattern, Past/current experiences, Profession, Time since onset of injury/illness/exacerbation, and 3+ comorbidities:  she has Anemia; Leukopenia; Barrett's esophagus; History of colonic polyps; Health care maintenance; Congestion of nasal sinus; Lower back pain; Bad odor of urine; Left arm numbness; Neck pain; Chest pain; History of abnormal cervical Pap smear; Stress; Facial droop; Numbness and tingling of right arm and leg; Irregular periods; Breast tenderness; Headache; Vaccine counseling; Hearing loss; Left sided abdominal pain; Weight gain; Family history of aneurysm; Family history of breast cancer; Dysphagia; History of genetic counseling; Joint pain; Alcohol use; Monoclonal B-cell lymphocytosis of undetermined significance; Breast cancer screening, high risk patient; Hand pain; Hip pain; and Thyroid  fullness on their problem list. she  has a past medical history of Anemia, BRCA negative (02/2021), Family history of breast cancer, Hypoglycemia, Increased risk of breast cancer (02/2021), Iron deficiency, Migraine, and Ovarian cyst. she  has a past surgical history that includes Breast cyst aspiration (Left, 1998) and Breast cyst aspiration (Left, 09/06/2020) are also  affecting patient's functional outcome.   REHAB POTENTIAL: Good  CLINICAL DECISION MAKING: Evolving/moderate complexity  EVALUATION COMPLEXITY: Moderate   GOALS: Goals reviewed with patient? No  SHORT TERM GOALS: Target date: 10/22/2023  Patient will be independent with initial home exercise program for self-management of symptoms. Baseline: Initial HEP to be provided at visit 2 as appropriate (10/08/23); Goal status: In progress   LONG TERM GOALS: Target date: 12/31/2023  Patient will be independent with a long-term home exercise program for self-management of symptoms.  Baseline: Initial HEP to be provided at visit 2 as appropriate (10/08/23); Goal status: In progress  2.  Patient will demonstrate improved Lower Extremity Functional Scale (LEFS) to equal or greater than 77/80 to demonstrate improvement in overall condition and self-reported functional ability.  Baseline: 68/80 (10/08/23); Goal status: In progress  3.  Patient  will demonstrate the ability to perform her hardest fitness activity (defined by patient) without increased symptoms to improve her ability to complete fitness activities.  Baseline: to be measured at visit 2 as appropriate (10/08/23); Goal status: In progress  4.  Patient will demonstrate full lumbar AROM without increased pain or symptoms to improve her ability to perform yoga and gardening.  Baseline: limited and painful left lumbar flexion and painful extension (10/08/23); Goal status: In progress  5.  Patient will demonstrate improvement in Patient Specific Functional Scale (PSFS) of equal or greater than 8/10 points to reflect clinically significant improvement in patient's most valued functional activities. Baseline: to be measured at visit 2 as appropriate (10/08/23); Goal status: In progress  6.  Patient will report NPRS equal or less than 3/10 during functional activities during the last 2 weeks to improve their abilitly to complete community,  work and/or recreational activities with less limitation. Baseline: 9/10 (10/08/23); Goal status: In progress   PLAN:  PT FREQUENCY: 2x/week  PT DURATION: 8-12 weeks  PLANNED INTERVENTIONS: 97164- PT Re-evaluation, 97750- Physical Performance Testing, 97110-Therapeutic exercises, 97530- Therapeutic activity, [id number]- Neuromuscular re-education, 97535- Self Care, [id number]- Manual therapy, [id number]- Electrical stimulation (unattended), 20560 (1-2 muscles), 20561 (3+ muscles)- Dry Needling, Patient/Family education, Cryotherapy, and Moist heat.  PLAN FOR NEXT SESSION: update HEP as appropriate, education on mechanical stressors and modifications of activities to avoid them, recover motor control and awareness of modifiers to mechanical sensitivities, retrain motor patterns, regain ROM, improve strength and resilience needed for  performing desired functional performance with appropriate ROM, strength, power, and endurance. Manual therapy as needed.    Camie SAUNDERS. Juli, PT, DPT, Cert. MDT 10/22/23, 3:17 PM  [facility] [address] [place], [place] [address] P: [phone] I F: [phone]

## 2023-10-26 ENCOUNTER — Ambulatory Visit: Admitting: Physical Therapy

## 2023-10-27 ENCOUNTER — Ambulatory Visit: Admitting: Physical Therapy

## 2023-10-29 ENCOUNTER — Ambulatory Visit: Admitting: Physical Therapy

## 2023-11-03 ENCOUNTER — Ambulatory Visit

## 2023-11-03 ENCOUNTER — Ambulatory Visit: Admitting: Physical Therapy

## 2023-11-03 DIAGNOSIS — M25552 Pain in left hip: Secondary | ICD-10-CM

## 2023-11-03 DIAGNOSIS — M5416 Radiculopathy, lumbar region: Secondary | ICD-10-CM | POA: Diagnosis not present

## 2023-11-03 DIAGNOSIS — M6281 Muscle weakness (generalized): Secondary | ICD-10-CM

## 2023-11-03 NOTE — Therapy (Signed)
 OUTPATIENT PHYSICAL THERAPY TREATMENT   Patient Name: Adrienne Phillips MRN: [id number] DOB:November 24, 1970, 53 y.o., female Today's Date: 11/03/2023  END OF SESSION:  PT End of Session - 11/03/23 0732     Visit Number 6    Number of Visits 17    Date for Recertification  12/31/23    Authorization Type [employer] reporting period from 10/08/2023    Progress Note Due on Visit 10    PT Start Time 0732    PT Stop Time 0819    PT Time Calculation (min) 47 min    Activity Tolerance Patient tolerated treatment well    Behavior During Therapy [facility] for tasks assessed/performed            Past Medical History:  Diagnosis Date   Anemia    BRCA negative 02/2021   MyRisk neg except AXIN2 VUS   Family history of breast cancer    Hypoglycemia    Increased risk of breast cancer 02/2021   IBIS=35.4%/riskscore=26.4%   Iron deficiency    Migraine    Ovarian cyst    Past Surgical History:  Procedure Laterality Date   BREAST CYST ASPIRATION Left 1998   BREAST CYST ASPIRATION Left 09/06/2020   1:00 FNA   Patient Active Problem List   Diagnosis Date Noted   Thyroid  fullness 09/17/2023   Hand pain 03/23/2023   Hip pain 03/23/2023   Breast cancer screening, high risk patient 03/19/2023   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   Alcohol use 05/29/2022   Joint pain 05/14/2022   History of genetic counseling 07/03/2021   Dysphagia 05/11/2021   Family history of breast cancer 02/12/2021   Family history of aneurysm 10/28/2020   Left sided abdominal pain 02/15/2020   Weight gain 02/15/2020   Vaccine counseling 09/18/2019   Hearing loss 09/18/2019   Headache 07/07/2019   Breast tenderness 07/03/2019   Irregular periods 09/13/2018   Facial droop 04/10/2017   Numbness and tingling of right arm and leg 04/10/2017   Stress 07/18/2016   History of abnormal cervical Pap smear 01/06/2016   Chest pain 06/30/2015   Left arm numbness 06/27/2015   Neck pain  06/27/2015   Bad odor of urine 11/13/2014   Lower back pain 11/08/2014   History of colonic polyps 03/19/2014   Health care maintenance 03/19/2014   Congestion of nasal sinus 03/19/2014   Barrett's esophagus 03/20/2013   Anemia 02/15/2012   Leukopenia 02/15/2012    PCP: Glendia Shad, MD  REFERRING PROVIDER: Glendia Shad, MD  REFERRING DIAG: hip pain, unspecified laterality  Rationale for Evaluation and Treatment: Rehabilitation  THERAPY DIAG:  Radiculopathy, lumbar region  Pain in left hip  Muscle weakness (generalized)  ONSET DATE: at least 1.5 years since PT evaluation  SUBJECTIVE:  PERTINENT HISTORY:  Patient is a 53 y.o. female who presents to outpatient physical therapy with a referral for medical diagnosis hip pain, unspecified laterality. This patient's chief complaints consist of pain in the left posterior glute radiating down leg to bottom of foot with intermittent tingling and weakness, leading to the following functional deficits: difficulty with usual activities such as sleep, prolonged standing, sitting, working, zumba, gardening, and physical fitness/activities such as running, jogging, yoga, strength training. Relevant past medical history and comorbidities include the following: she has Anemia; Leukopenia; Barrett's esophagus; History of colonic polyps; Health care maintenance; Congestion of nasal sinus; Lower back pain; Bad odor of urine; Left arm numbness; Neck pain; Chest pain; History of abnormal cervical Pap smear; Stress; Facial droop; Numbness and tingling of right arm and leg; Irregular periods; Breast tenderness; Headache; Vaccine counseling; Hearing loss; Left sided abdominal pain; Weight gain; Family history of aneurysm; Family history of breast cancer; Dysphagia; History  of genetic counseling; Joint pain; Alcohol use; Monoclonal B-cell lymphocytosis of undetermined significance; Breast cancer screening, high risk patient; Hand pain; Hip pain; and Thyroid  fullness on their problem list. she  has a past medical history of Anemia, BRCA negative (02/2021), Family history of breast cancer, Hypoglycemia, Increased risk of breast cancer (02/2021), Iron deficiency, Migraine, and Ovarian cyst. she  has a past surgical history that includes Breast cyst aspiration (Left, 1998) and Breast cyst aspiration (Left, 09/06/2020). Patient denies hx of cancer, stroke, lung problems, heart problems, diabetes, unexplained weight loss, unexplained changes in bowel or bladder problems, unexplained stumbling or dropping things, osteoporosis, and spinal surgery. 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   Exercise history:  Work out and be active, runs, jogs, zumba, or walking, lifting weights, hiking, gardening. She just pushes herself through.   SUBJECTIVE STATEMENT: Low back and L LE is ok. Did Zumba this past Saturday but did not work out well. The class went fine, was a little sore afterwards but she was expecting it. Went for a walk at her normal place and her L heel has been bothering her (feels like a stone bruise. This was before Zumba (a week ago); 6/10 currently (Achilles tendon attachment area). 1/10 L hip/low back area currently.  The manual traction helps.      Top 3 problems: Weights (Does not feel like she can push herself because she is worried she will mess something up), unsure about Zumba (she hasn't done it)  and going up hills in the yard (leg feels a little weak initially).   PAIN: NPRS: Current: 2/10 left SIJ region, left distal left thigh (she feels a knot there), and pain in the the arch of the left foot and the heel.   From initial PT evaluation on 10/08/23:  Best: 4/10, Worst: 9/10. Pain location: left posterior iliac crest, down back of left leg into the  bottom of her left foot to the arch and lateral heel.  Pain description: Sometimes tingling in the the left foot. Hip is gnawing pain . Numbness/tingling: tingling in left foot.  Aggravating factors: zumba (foot will go numb, hip will aggravate her and leg won't work), prolonged standing activities, sitting.  Relieving factors: on weekends when doing things other than standing at desk, anything that is different from usual, massage, alcohol, stretching, deep blue, shifting away from the left.    PRECAUTIONS: None  PATIENT GOALS: I would love for the pain to go away or at least help my pain   OBJECTIVE  TREATMENT  11/03/2023  Therapeutic exercise Supine hip IR at 90/90 position R: 25 degrees, stiff end feel L 21 degrees, stiff end feel.   Long sit test suggests anterior nutation of L innominate.   Supine L hip IR stretch with PT 1 min x 3  Supine with hip at 90/90   Hip IR PROM L 10x3   Seated hip IR L 10x5 seconds for 2 sets   L hip feels better afterwards during standing and gait.   With B UE assist  Standing B eccentric heel lowering 10x on flat surface    Then from first stair step 10x2  Seated L LE neural flossing 10x2  Originally felt better during first set, increased symptoms during 2nd set.    Seated L piriformis stretch 1 minute   Improved L plantar foot symptoms.      Improved exercise technique, movement at target joints, use of target muscles after mod verbal, visual, tactile cues.     PATIENT EDUCATION:  Education details: Education about directional preference and avoidance of flexion and sciatic nerve tension. Education about activity modifications.  Person educated: Patient Education method: Explanation, Demonstration, Tactile cues, Verbal cues, and Handouts Education comprehension: verbalized understanding, returned  demonstration, and needs further education  HOME EXERCISE PROGRAM: Access Code: [email or web address] URL: [email or web address] Date: 10/19/2023 Prepared by: Camie Cleverly  Exercises - Prone Off Center Lumbar Extension Press Up  - 1 sets - 20 reps - 1 second hold - every 2 hours frequency - Seated Correct Posture  - Half Kneeling Hip Flexor Stretch  - 1 x daily - 1 sets - 3 reps - 45 seconds hold - Quadruped Hip Hike on Foam  - 1 x daily - 3 sets - 5 reps - 5 seconds hold - 1.5 minutes time practicing per set  - Seated Piriformis Stretch  - 3 x daily - 7 x weekly - 1 sets - 3 reps - 1 minute hold   ASSESSMENT:  CLINICAL IMPRESSION: Pt demonstrated B hip IR stiffness L > R today. Worked on improving movement which helped decrease pain. Pt also demonstrates L Achilles pain today since about 2 weeks ago. Worked on concentric and eccentric loading which pt states helped it feel a little better but still has symptoms.  Patient would benefit from continued management of limiting condition by skilled physical therapist to address remaining impairments and functional limitations to work towards stated goals and return to PLOF or maximal functional independence.      MDT classification: lumbar derangement syndrome with extension + left sidebending preference.  Mechanical sensitivities: load, Nerve tension  From initial PT evaluation on 10/08/2023:  Patient is a 53 y.o. female referred to outpatient physical therapy with a medical diagnosis of hip pain, unspecified laterality who presents with signs and symptoms consistent with lumbar radiculopathy that is likely causing her L hip pain and leg symptoms. Patient has positive traction alleviation test, nerve tension tests, and has decreased strength on the L during single leg heel raise test than the right. Her pattern of pain and symptoms of weakness and tingling suggest lumbar radiculopathy causing her complaint. Plan to treat for lumbar  radiculopathy to resolve hip pain and test further for hip joint pain as appropriate as low back symptoms improves or if there is no improvement. Hip testing is of limited utility in the presence of overlapping radicular symptoms.   Patient presents with significant pain, paresthesia, muscle performance (strength/power/endurance), motor control, nerve tension, balance, ROM, and activity tolerance impairments that are  limiting ability to complete usual activities such as sleep, prolonged standing, sitting, working, zumba, gardening, and physical fitness/activities such as running, jogging, yoga, strength training without difficulty. Patient will benefit from skilled physical therapy intervention to address current body structure impairments and activity limitations to improve function and work towards goals set in current POC in order to return to prior level of function or maximal functional improvement.    OBJECTIVE IMPAIRMENTS: decreased activity tolerance, decreased balance, decreased coordination, decreased endurance, decreased knowledge of condition, decreased mobility, decreased ROM, decreased strength, increased edema, increased muscle spasms, impaired flexibility, impaired sensation, and pain.   ACTIVITY LIMITATIONS: sitting, standing, and sleeping  PARTICIPATION LIMITATIONS: interpersonal relationship, community activity, and occupation and ability to complete usual activities such as sleep, prolonged standing, sitting, working, zumba, gardening, and physical fitness/activities such as running, jogging, yoga, strength training  PERSONAL FACTORS: Age, Behavior pattern, Past/current experiences, Profession, Time since onset of injury/illness/exacerbation, and 3+ comorbidities:  she has Anemia; Leukopenia; Barrett's esophagus; History of colonic polyps; Health care maintenance; Congestion of nasal sinus; Lower back pain; Bad odor of urine; Left arm numbness; Neck pain; Chest pain; History of abnormal  cervical Pap smear; Stress; Facial droop; Numbness and tingling of right arm and leg; Irregular periods; Breast tenderness; Headache; Vaccine counseling; Hearing loss; Left sided abdominal pain; Weight gain; Family history of aneurysm; Family history of breast cancer; Dysphagia; History of genetic counseling; Joint pain; Alcohol use; Monoclonal B-cell lymphocytosis of undetermined significance; Breast cancer screening, high risk patient; Hand pain; Hip pain; and Thyroid  fullness on their problem list. she  has a past medical history of Anemia, BRCA negative (02/2021), Family history of breast cancer, Hypoglycemia, Increased risk of breast cancer (02/2021), Iron deficiency, Migraine, and Ovarian cyst. she  has a past surgical history that includes Breast cyst aspiration (Left, 1998) and Breast cyst aspiration (Left, 09/06/2020) are also affecting patient's functional outcome.   REHAB POTENTIAL: Good  CLINICAL DECISION MAKING: Evolving/moderate complexity  EVALUATION COMPLEXITY: Moderate   GOALS: Goals reviewed with patient? No  SHORT TERM GOALS: Target date: 10/22/2023  Patient will be independent with initial home exercise program for self-management of symptoms. Baseline: Initial HEP to be provided at visit 2 as appropriate (10/08/23); Goal status: In progress   LONG TERM GOALS: Target date: 12/31/2023  Patient will be independent with a long-term home exercise program for self-management of symptoms.  Baseline: Initial HEP to be provided at visit 2 as appropriate (10/08/23); Goal status: In progress  2.  Patient will demonstrate improved Lower Extremity Functional Scale (LEFS) to equal or greater than 77/80 to demonstrate improvement in overall condition and self-reported functional ability.  Baseline: 68/80 (10/08/23); Goal status: In progress  3.  Patient will demonstrate the ability to perform her hardest fitness activity (defined by patient) without increased symptoms to improve her  ability to complete fitness activities.  Baseline: to be measured at visit 2 as appropriate (10/08/23); Goal status: In progress  4.  Patient will demonstrate full lumbar AROM without increased pain or symptoms to improve her ability to perform yoga and gardening.  Baseline: limited and painful left lumbar flexion and painful extension (10/08/23); Goal status: In progress  5.  Patient will demonstrate improvement in Patient Specific Functional Scale (PSFS) of equal or greater than 8/10 points to reflect clinically significant improvement in patient's most valued functional activities. Baseline: to be measured at visit 2 as appropriate (10/08/23); Goal status: In progress  6.  Patient will report NPRS equal or less than 3/10  during functional activities during the last 2 weeks to improve their abilitly to complete community, work and/or recreational activities with less limitation. Baseline: 9/10 (10/08/23); Goal status: In progress   PLAN:  PT FREQUENCY: 2x/week  PT DURATION: 8-12 weeks  PLANNED INTERVENTIONS: 97164- PT Re-evaluation, 97750- Physical Performance Testing, 97110-Therapeutic exercises, 97530- Therapeutic activity, [id number]- Neuromuscular re-education, 97535- Self Care, 02859- Manual therapy, G0283- Electrical stimulation (unattended), 20560 (1-2 muscles), 20561 (3+ muscles)- Dry Needling, Patient/Family education, Cryotherapy, and Moist heat.  PLAN FOR NEXT SESSION: update HEP as appropriate, education on mechanical stressors and modifications of activities to avoid them, recover motor control and awareness of modifiers to mechanical sensitivities, retrain motor patterns, regain ROM, improve strength and resilience needed for  performing desired functional performance with appropriate ROM, strength, power, and endurance. Manual therapy as needed.    Emil Glassman PT, DPT  11/03/23, 8:33 AM  [facility] [address] [place], [place]  [address] P: [phone] I F: [phone]

## 2023-11-05 ENCOUNTER — Ambulatory Visit: Admitting: Physical Therapy

## 2023-11-05 ENCOUNTER — Ambulatory Visit

## 2023-11-05 DIAGNOSIS — M6281 Muscle weakness (generalized): Secondary | ICD-10-CM

## 2023-11-05 DIAGNOSIS — M5416 Radiculopathy, lumbar region: Secondary | ICD-10-CM | POA: Diagnosis not present

## 2023-11-05 DIAGNOSIS — M25552 Pain in left hip: Secondary | ICD-10-CM

## 2023-11-05 NOTE — Therapy (Signed)
 OUTPATIENT PHYSICAL THERAPY TREATMENT   Patient Name: Adrienne Phillips MRN: [id number] DOB:[birth year], 53 y.o., female Today's Date: 11/05/2023  END OF SESSION:  PT End of Session - 11/05/23 0734     Visit Number 7    Number of Visits 17    Date for Recertification  12/31/23    Authorization Type [employer] reporting period from 10/08/2023    Progress Note Due on Visit 10    PT Start Time 0734    PT Stop Time 0815    PT Time Calculation (min) 41 min    Activity Tolerance Patient tolerated treatment well    Behavior During Therapy [facility] for tasks assessed/performed             Past Medical History:  Diagnosis Date   Anemia    BRCA negative 02/2021   MyRisk neg except AXIN2 VUS   Family history of breast cancer    Hypoglycemia    Increased risk of breast cancer 02/2021   IBIS=35.4%/riskscore=26.4%   Iron deficiency    Migraine    Ovarian cyst    Past Surgical History:  Procedure Laterality Date   BREAST CYST ASPIRATION Left 1998   BREAST CYST ASPIRATION Left 09/06/2020   1:00 FNA   Patient Active Problem List   Diagnosis Date Noted   Thyroid  fullness 09/17/2023   Hand pain 03/23/2023   Hip pain 03/23/2023   Breast cancer screening, high risk patient 03/19/2023   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   Alcohol use 05/29/2022   Joint pain 05/14/2022   History of genetic counseling 07/03/2021   Dysphagia 05/11/2021   Family history of breast cancer 02/12/2021   Family history of aneurysm 10/28/2020   Left sided abdominal pain 02/15/2020   Weight gain 02/15/2020   Vaccine counseling 09/18/2019   Hearing loss 09/18/2019   Headache 07/07/2019   Breast tenderness 07/03/2019   Irregular periods 09/13/2018   Facial droop 04/10/2017   Numbness and tingling of right arm and leg 04/10/2017   Stress 07/18/2016   History of abnormal cervical Pap smear 01/06/2016   Chest pain 06/30/2015   Left arm numbness 06/27/2015   Neck pain  06/27/2015   Bad odor of urine 11/13/2014   Lower back pain 11/08/2014   History of colonic polyps 03/19/2014   Health care maintenance 03/19/2014   Congestion of nasal sinus 03/19/2014   Barrett's esophagus 03/20/2013   Anemia 02/15/2012   Leukopenia 02/15/2012    PCP: Glendia Shad, MD  REFERRING PROVIDER: Glendia Shad, MD  REFERRING DIAG: hip pain, unspecified laterality  Rationale for Evaluation and Treatment: Rehabilitation  THERAPY DIAG:  Radiculopathy, lumbar region  Pain in left hip  Muscle weakness (generalized)  ONSET DATE: at least 1.5 years since PT evaluation  SUBJECTIVE:  PERTINENT HISTORY:  Patient is a 53 y.o. female who presents to outpatient physical therapy with a referral for medical diagnosis hip pain, unspecified laterality. This patient's chief complaints consist of pain in the left posterior glute radiating down leg to bottom of foot with intermittent tingling and weakness, leading to the following functional deficits: difficulty with usual activities such as sleep, prolonged standing, sitting, working, zumba, gardening, and physical fitness/activities such as running, jogging, yoga, strength training. Relevant past medical history and comorbidities include the following: she has Anemia; Leukopenia; Barrett's esophagus; History of colonic polyps; Health care maintenance; Congestion of nasal sinus; Lower back pain; Bad odor of urine; Left arm numbness; Neck pain; Chest pain; History of abnormal cervical Pap smear; Stress; Facial droop; Numbness and tingling of right arm and leg; Irregular periods; Breast tenderness; Headache; Vaccine counseling; Hearing loss; Left sided abdominal pain; Weight gain; Family history of aneurysm; Family history of breast cancer; Dysphagia; History  of genetic counseling; Joint pain; Alcohol use; Monoclonal B-cell lymphocytosis of undetermined significance; Breast cancer screening, high risk patient; Hand pain; Hip pain; and Thyroid  fullness on their problem list. she  has a past medical history of Anemia, BRCA negative (02/2021), Family history of breast cancer, Hypoglycemia, Increased risk of breast cancer (02/2021), Iron deficiency, Migraine, and Ovarian cyst. she  has a past surgical history that includes Breast cyst aspiration (Left, 1998) and Breast cyst aspiration (Left, 09/06/2020). Patient denies hx of cancer, stroke, lung problems, heart problems, diabetes, unexplained weight loss, unexplained changes in bowel or bladder problems, unexplained stumbling or dropping things, osteoporosis, and spinal surgery. 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   Exercise history:  Work out and be active, runs, jogs, zumba, or walking, lifting weights, hiking, gardening. She just pushes herself through.   SUBJECTIVE STATEMENT: Low back and L hip is doing fine today. The stretches helped the other day, pt almost forgot about her ankle and heel because the pain almost went away. 5/10 L heel pain currently.   Did some walking outisde on Tuesday, hip still feels like it needs to be stretched and elongated. All and all the L hip is definitely better.     PAIN: NPRS: Current: 2/10 left SIJ region, left distal left thigh (she feels a knot there), and pain in the the arch of the left foot and the heel.   From initial PT evaluation on 10/08/23:  Best: 4/10, Worst: 9/10. Pain location: left posterior iliac crest, down back of left leg into the bottom of her left foot to the arch and lateral heel.  Pain description: Sometimes tingling in the the left foot. Hip is gnawing pain . Numbness/tingling: tingling in left foot.  Aggravating factors: zumba (foot will go numb, hip will aggravate her and leg won't work), prolonged standing activities, sitting.   Relieving factors: on weekends when doing things other than standing at desk, anything that is different from usual, massage, alcohol, stretching, deep blue, shifting away from the left.    PRECAUTIONS: None  PATIENT GOALS: I would love for the pain to go away or at least help my pain   OBJECTIVE  TREATMENT  11/05/2023   Manual therapy Seated STM L heel area to decrease fascial restrictions  Decreased L heel pain afterwards during gait reported.     Therapeutic exercise  With B UE assist  Standing B eccentric heel lowering from first stair step 10x2 Felt good initially. Discomfort at end range during 2nd set, 2/10 level  Supine L hip IR stretch with PT 1 min x 2  Supine L hip piriformis stretch 1 min  Supine with hip at 90/90   Hip IR PROM L 10x3   Seated hip IR L 10x5 seconds for 2 sets   Single leg dead lift with contralateral UE assist PRN  L 10x3    Improved exercise technique, movement at target joints, use of target muscles after mod verbal, visual, tactile cues.     PATIENT EDUCATION:  Education details: Education about directional preference and avoidance of flexion and sciatic nerve tension. Education about activity modifications.  Person educated: Patient Education method: Explanation, Demonstration, Tactile cues, Verbal cues, and Handouts Education comprehension: verbalized understanding, returned demonstration, and needs further education  HOME EXERCISE PROGRAM: Access Code: [email or web address] URL: [email or web address] Date: 10/19/2023 Prepared by: Camie Cleverly  Exercises - Prone Off Center Lumbar Extension Press Up  - 1 sets - 20 reps - 1 second hold - every 2 hours frequency - Seated Correct Posture  - Half Kneeling Hip Flexor Stretch  - 1 x daily - 1 sets - 3 reps - 45 seconds hold - Quadruped Hip Hike on Foam  - 1  x daily - 3 sets - 5 reps - 5 seconds hold - 1.5 minutes time practicing per set  - Seated Piriformis Stretch  - 3 x daily - 7 x weekly - 1 sets - 3 reps - 1 minute hold - Seated Hip Internal Rotation AROM  - 3 x daily - 7 x weekly - 3 sets - 10 reps - 5 seconds hold     ASSESSMENT:  CLINICAL IMPRESSION:  Worked on STM to decrease L heel fascial restrictions which helped improve comfort level during gait. Continued working on eccentric loading to promote healing of affected tissues. Continued with L hip IR followed by glute max strengthening to promote movement and decrease stress to affected areas. Decreased L heel pain to 2/10 and no L hip pain after session, just feels like she has worked it.   Patient would benefit from continued management of limiting condition by skilled physical therapist to address remaining impairments and functional limitations to work towards stated goals and return to PLOF or maximal functional independence.      MDT classification: lumbar derangement syndrome with extension + left sidebending preference.  Mechanical sensitivities: load, Nerve tension  From initial PT evaluation on 10/08/2023:  Patient is a 53 y.o. female referred to outpatient physical therapy with a medical diagnosis of hip pain, unspecified laterality who presents with signs and symptoms consistent with lumbar radiculopathy that is likely causing her L hip pain and leg symptoms. Patient has positive traction alleviation test, nerve tension tests, and has decreased strength on the L during single leg heel raise test than the right. Her pattern of pain and symptoms of weakness and tingling suggest lumbar radiculopathy causing her complaint. Plan to treat for lumbar radiculopathy to resolve hip pain and test further for hip joint pain as appropriate as low back symptoms improves or if there is no improvement. Hip testing is of limited utility in the presence of overlapping radicular symptoms.    Patient presents with  significant pain, paresthesia, muscle performance (strength/power/endurance), motor control, nerve tension, balance, ROM, and activity tolerance impairments that are limiting ability to complete usual activities such as sleep, prolonged standing, sitting, working, zumba, gardening, and physical fitness/activities such as running, jogging, yoga, strength training without difficulty. Patient will benefit from skilled physical therapy intervention to address current body structure impairments and activity limitations to improve function and work towards goals set in current POC in order to return to prior level of function or maximal functional improvement.    OBJECTIVE IMPAIRMENTS: decreased activity tolerance, decreased balance, decreased coordination, decreased endurance, decreased knowledge of condition, decreased mobility, decreased ROM, decreased strength, increased edema, increased muscle spasms, impaired flexibility, impaired sensation, and pain.   ACTIVITY LIMITATIONS: sitting, standing, and sleeping  PARTICIPATION LIMITATIONS: interpersonal relationship, community activity, and occupation and ability to complete usual activities such as sleep, prolonged standing, sitting, working, zumba, gardening, and physical fitness/activities such as running, jogging, yoga, strength training  PERSONAL FACTORS: Age, Behavior pattern, Past/current experiences, Profession, Time since onset of injury/illness/exacerbation, and 3+ comorbidities:  she has Anemia; Leukopenia; Barrett's esophagus; History of colonic polyps; Health care maintenance; Congestion of nasal sinus; Lower back pain; Bad odor of urine; Left arm numbness; Neck pain; Chest pain; History of abnormal cervical Pap smear; Stress; Facial droop; Numbness and tingling of right arm and leg; Irregular periods; Breast tenderness; Headache; Vaccine counseling; Hearing loss; Left sided abdominal pain; Weight gain; Family history of  aneurysm; Family history of breast cancer; Dysphagia; History of genetic counseling; Joint pain; Alcohol use; Monoclonal B-cell lymphocytosis of undetermined significance; Breast cancer screening, high risk patient; Hand pain; Hip pain; and Thyroid  fullness on their problem list. she  has a past medical history of Anemia, BRCA negative (02/2021), Family history of breast cancer, Hypoglycemia, Increased risk of breast cancer (02/2021), Iron deficiency, Migraine, and Ovarian cyst. she  has a past surgical history that includes Breast cyst aspiration (Left, 1998) and Breast cyst aspiration (Left, 09/06/2020) are also affecting patient's functional outcome.   REHAB POTENTIAL: Good  CLINICAL DECISION MAKING: Evolving/moderate complexity  EVALUATION COMPLEXITY: Moderate   GOALS: Goals reviewed with patient? No  SHORT TERM GOALS: Target date: 10/22/2023  Patient will be independent with initial home exercise program for self-management of symptoms. Baseline: Initial HEP to be provided at visit 2 as appropriate (10/08/23); Goal status: In progress   LONG TERM GOALS: Target date: 12/31/2023  Patient will be independent with a long-term home exercise program for self-management of symptoms.  Baseline: Initial HEP to be provided at visit 2 as appropriate (10/08/23); Goal status: In progress  2.  Patient will demonstrate improved Lower Extremity Functional Scale (LEFS) to equal or greater than 77/80 to demonstrate improvement in overall condition and self-reported functional ability.  Baseline: 68/80 (10/08/23); Goal status: In progress  3.  Patient will demonstrate the ability to perform her hardest fitness activity (defined by patient) without increased symptoms to improve her ability to complete fitness activities.  Baseline: to be measured at visit 2 as appropriate (10/08/23); Goal status: In progress  4.  Patient will demonstrate full lumbar AROM without increased pain or symptoms to improve  her ability to perform yoga and gardening.  Baseline: limited and painful left lumbar flexion and painful extension (10/08/23); Goal status: In progress  5.  Patient will demonstrate improvement in Patient Specific Functional Scale (PSFS) of equal or greater than 8/10 points to reflect clinically significant improvement in patient's most valued functional activities. Baseline: to be measured at visit 2 as  appropriate (10/08/23); Goal status: In progress  6.  Patient will report NPRS equal or less than 3/10 during functional activities during the last 2 weeks to improve their abilitly to complete community, work and/or recreational activities with less limitation. Baseline: 9/10 (10/08/23); Goal status: In progress   PLAN:  PT FREQUENCY: 2x/week  PT DURATION: 8-12 weeks  PLANNED INTERVENTIONS: 97164- PT Re-evaluation, 97750- Physical Performance Testing, 97110-Therapeutic exercises, 97530- Therapeutic activity, [id number]- Neuromuscular re-education, 97535- Self Care, [id number]- Manual therapy, G0283- Electrical stimulation (unattended), 20560 (1-2 muscles), 20561 (3+ muscles)- Dry Needling, Patient/Family education, Cryotherapy, and Moist heat.  PLAN FOR NEXT SESSION: update HEP as appropriate, education on mechanical stressors and modifications of activities to avoid them, recover motor control and awareness of modifiers to mechanical sensitivities, retrain motor patterns, regain ROM, improve strength and resilience needed for  performing desired functional performance with appropriate ROM, strength, power, and endurance. Manual therapy as needed.    Emil Glassman PT, DPT  11/05/23, 5:11 PM  [facility] [address] [place], [place] [address] P: [phone] I F: [phone]

## 2023-11-10 ENCOUNTER — Ambulatory Visit

## 2023-11-10 ENCOUNTER — Encounter: Admitting: Physical Therapy

## 2023-11-10 DIAGNOSIS — M25552 Pain in left hip: Secondary | ICD-10-CM

## 2023-11-10 DIAGNOSIS — M6281 Muscle weakness (generalized): Secondary | ICD-10-CM

## 2023-11-10 DIAGNOSIS — M5416 Radiculopathy, lumbar region: Secondary | ICD-10-CM

## 2023-11-10 NOTE — Therapy (Signed)
 OUTPATIENT PHYSICAL THERAPY TREATMENT   Patient Name: Adrienne Phillips MRN: [id number] DOB:05-08-1970, 53 y.o., female Today's Date: 11/10/2023  END OF SESSION:  PT End of Session - 11/10/23 0818     Visit Number 8    Number of Visits 17    Date for Recertification  12/31/23    Authorization Type [employer] reporting period from 10/08/2023    Progress Note Due on Visit 10    PT Start Time 0818    PT Stop Time 0859    PT Time Calculation (min) 41 min    Activity Tolerance Patient tolerated treatment well    Behavior During Therapy [facility] for tasks assessed/performed              Past Medical History:  Diagnosis Date   Anemia    BRCA negative 02/2021   MyRisk neg except AXIN2 VUS   Family history of breast cancer    Hypoglycemia    Increased risk of breast cancer 02/2021   IBIS=35.4%/riskscore=26.4%   Iron deficiency    Migraine    Ovarian cyst    Past Surgical History:  Procedure Laterality Date   BREAST CYST ASPIRATION Left 1998   BREAST CYST ASPIRATION Left 09/06/2020   1:00 FNA   Patient Active Problem List   Diagnosis Date Noted   Thyroid  fullness 09/17/2023   Hand pain 03/23/2023   Hip pain 03/23/2023   Breast cancer screening, high risk patient 03/19/2023   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   Alcohol use 05/29/2022   Joint pain 05/14/2022   History of genetic counseling 07/03/2021   Dysphagia 05/11/2021   Family history of breast cancer 02/12/2021   Family history of aneurysm 10/28/2020   Left sided abdominal pain 02/15/2020   Weight gain 02/15/2020   Vaccine counseling 09/18/2019   Hearing loss 09/18/2019   Headache 07/07/2019   Breast tenderness 07/03/2019   Irregular periods 09/13/2018   Facial droop 04/10/2017   Numbness and tingling of right arm and leg 04/10/2017   Stress 07/18/2016   History of abnormal cervical Pap smear 01/06/2016   Chest pain 06/30/2015   Left arm numbness 06/27/2015   Neck pain  06/27/2015   Bad odor of urine 11/13/2014   Lower back pain 11/08/2014   History of colonic polyps 03/19/2014   Health care maintenance 03/19/2014   Congestion of nasal sinus 03/19/2014   Barrett's esophagus 03/20/2013   Anemia 02/15/2012   Leukopenia 02/15/2012    PCP: Glendia Shad, MD  REFERRING PROVIDER: Glendia Shad, MD  REFERRING DIAG: hip pain, unspecified laterality  Rationale for Evaluation and Treatment: Rehabilitation  THERAPY DIAG:  Radiculopathy, lumbar region  Pain in left hip  Muscle weakness (generalized)  ONSET DATE: at least 1.5 years since PT evaluation  SUBJECTIVE:  PERTINENT HISTORY:  Patient is a 53 y.o. female who presents to outpatient physical therapy with a referral for medical diagnosis hip pain, unspecified laterality. This patient's chief complaints consist of pain in the left posterior glute radiating down leg to bottom of foot with intermittent tingling and weakness, leading to the following functional deficits: difficulty with usual activities such as sleep, prolonged standing, sitting, working, zumba, gardening, and physical fitness/activities such as running, jogging, yoga, strength training. Relevant past medical history and comorbidities include the following: she has Anemia; Leukopenia; Barrett's esophagus; History of colonic polyps; Health care maintenance; Congestion of nasal sinus; Lower back pain; Bad odor of urine; Left arm numbness; Neck pain; Chest pain; History of abnormal cervical Pap smear; Stress; Facial droop; Numbness and tingling of right arm and leg; Irregular periods; Breast tenderness; Headache; Vaccine counseling; Hearing loss; Left sided abdominal pain; Weight gain; Family history of aneurysm; Family history of breast cancer; Dysphagia; History  of genetic counseling; Joint pain; Alcohol use; Monoclonal B-cell lymphocytosis of undetermined significance; Breast cancer screening, high risk patient; Hand pain; Hip pain; and Thyroid  fullness on their problem list. she  has a past medical history of Anemia, BRCA negative (02/2021), Family history of breast cancer, Hypoglycemia, Increased risk of breast cancer (02/2021), Iron deficiency, Migraine, and Ovarian cyst. she  has a past surgical history that includes Breast cyst aspiration (Left, 1998) and Breast cyst aspiration (Left, 09/06/2020). Patient denies hx of cancer, stroke, lung problems, heart problems, diabetes, unexplained weight loss, unexplained changes in bowel or bladder problems, unexplained stumbling or dropping things, osteoporosis, and spinal surgery. 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   Exercise history:  Work out and be active, runs, jogs, zumba, or walking, lifting weights, hiking, gardening. She just pushes herself through.   SUBJECTIVE STATEMENT: Low back and L hip are good this morning. Was a lot better after last session last week. L ankle was a little tight Saturday, did her stretches, then its better.   No L low back and hip pain currently.  No L heel pain currently.   Zumba is her hardest fitness activity. The side to side jumps at Zumba, pt feels unsteady with L heel pain     PAIN: NPRS: Current: 2/10 left SIJ region, left distal left thigh (she feels a knot there), and pain in the the arch of the left foot and the heel.   From initial PT evaluation on 10/08/23:  Best: 4/10, Worst: 9/10. Pain location: left posterior iliac crest, down back of left leg into the bottom of her left foot to the arch and lateral heel.  Pain description: Sometimes tingling in the the left foot. Hip is gnawing pain . Numbness/tingling: tingling in left foot.  Aggravating factors: zumba (foot will go numb, hip will aggravate her and leg won't work), prolonged standing  activities, sitting.  Relieving factors: on weekends when doing things other than standing at desk, anything that is different from usual, massage, alcohol, stretching, deep blue, shifting away from the left.    PRECAUTIONS: None  PATIENT GOALS: I would love for the pain to go away or at least help my pain   OBJECTIVE  TREATMENT  11/10/2023  Therapeutic activities Performed with the intent to promote ability to perform standing tasks more comfortably.    Split squats with contralatearl UE assist   L 10x3   R 10x3  With B UE assist  Standing B eccentric heel lowering from first stair step 10x  Forward walking lunges 30 ft x 2  With B UE assist  Standing B eccentric heel lowering from first stair step 10x  S/L reverse clamshell   L 10x, then 10x5 seconds holds for 3 sets  Single leg dead lift with contralateral UE assist PRN  L 10x3   Pt demonstrating side to side jumps for Zumba, no L hip or low back pain but demonstrates L heel pain and unsteadiness.   Standing mini jumps both feet (like jump rope) 15 seconds x 3  Level 1 plyometrics to improve Achilles tendon strength    Side to side lateral jumps simulating Zumba   10x3     Improved technique, movement at target joints, use of target muscles after mod verbal, visual, tactile cues.   Manual therapy  Seated STM L heel area to decrease fascial restrictions  Improved comfort level with zumba side jump moves      PATIENT EDUCATION:  Education details: Education about directional preference and avoidance of flexion and sciatic nerve tension. Education about activity modifications.  Person educated: Patient Education method: Explanation, Demonstration, Tactile cues, Verbal cues, and Handouts Education comprehension: verbalized understanding, returned demonstration, and needs further  education  HOME EXERCISE PROGRAM: Access Code: [email or web address] URL: [email or web address] Date: 10/19/2023 Prepared by: Camie Cleverly  Exercises - Prone Off Center Lumbar Extension Press Up  - 1 sets - 20 reps - 1 second hold - every 2 hours frequency - Seated Correct Posture  - Half Kneeling Hip Flexor Stretch  - 1 x daily - 1 sets - 3 reps - 45 seconds hold - Quadruped Hip Hike on Foam  - 1 x daily - 3 sets - 5 reps - 5 seconds hold - 1.5 minutes time practicing per set  - Seated Piriformis Stretch  - 3 x daily - 7 x weekly - 1 sets - 3 reps - 1 minute hold - Seated Hip Internal Rotation AROM  - 3 x daily - 7 x weekly - 3 sets - 10 reps - 5 seconds hold     ASSESSMENT:  CLINICAL IMPRESSION:  Continued working on improving L glute strength as well as hip IR to decrease stress to L hip and low back. Continued working on improving facial mobility L heel as well as proper loading to L achilles to promote healing, decrease pain and return to her Zumba fitness routine when appropriate. No L hip, back and heel pain reported after session. Patient would benefit from continued management of limiting condition by skilled physical therapist to address remaining impairments and functional limitations to work towards stated goals and return to PLOF or maximal functional independence.      MDT classification: lumbar derangement syndrome with extension + left sidebending preference.  Mechanical sensitivities: load, Nerve tension  From initial PT evaluation on 10/08/2023:  Patient is a 53 y.o. female referred to outpatient physical therapy with a medical diagnosis of hip pain, unspecified laterality who presents with signs and symptoms consistent with lumbar radiculopathy that is likely causing her L hip pain and leg symptoms. Patient has positive traction alleviation test, nerve tension tests, and has decreased strength on the L during single leg heel raise test than the right. Her  pattern of  pain and symptoms of weakness and tingling suggest lumbar radiculopathy causing her complaint. Plan to treat for lumbar radiculopathy to resolve hip pain and test further for hip joint pain as appropriate as low back symptoms improves or if there is no improvement. Hip testing is of limited utility in the presence of overlapping radicular symptoms.   Patient presents with significant pain, paresthesia, muscle performance (strength/power/endurance), motor control, nerve tension, balance, ROM, and activity tolerance impairments that are limiting ability to complete usual activities such as sleep, prolonged standing, sitting, working, zumba, gardening, and physical fitness/activities such as running, jogging, yoga, strength training without difficulty. Patient will benefit from skilled physical therapy intervention to address current body structure impairments and activity limitations to improve function and work towards goals set in current POC in order to return to prior level of function or maximal functional improvement.    OBJECTIVE IMPAIRMENTS: decreased activity tolerance, decreased balance, decreased coordination, decreased endurance, decreased knowledge of condition, decreased mobility, decreased ROM, decreased strength, increased edema, increased muscle spasms, impaired flexibility, impaired sensation, and pain.   ACTIVITY LIMITATIONS: sitting, standing, and sleeping  PARTICIPATION LIMITATIONS: interpersonal relationship, community activity, and occupation and ability to complete usual activities such as sleep, prolonged standing, sitting, working, zumba, gardening, and physical fitness/activities such as running, jogging, yoga, strength training  PERSONAL FACTORS: Age, Behavior pattern, Past/current experiences, Profession, Time since onset of injury/illness/exacerbation, and 3+ comorbidities:  she has Anemia; Leukopenia; Barrett's esophagus; History of colonic polyps; Health care maintenance;  Congestion of nasal sinus; Lower back pain; Bad odor of urine; Left arm numbness; Neck pain; Chest pain; History of abnormal cervical Pap smear; Stress; Facial droop; Numbness and tingling of right arm and leg; Irregular periods; Breast tenderness; Headache; Vaccine counseling; Hearing loss; Left sided abdominal pain; Weight gain; Family history of aneurysm; Family history of breast cancer; Dysphagia; History of genetic counseling; Joint pain; Alcohol use; Monoclonal B-cell lymphocytosis of undetermined significance; Breast cancer screening, high risk patient; Hand pain; Hip pain; and Thyroid  fullness on their problem list. she  has a past medical history of Anemia, BRCA negative (02/2021), Family history of breast cancer, Hypoglycemia, Increased risk of breast cancer (02/2021), Iron deficiency, Migraine, and Ovarian cyst. she  has a past surgical history that includes Breast cyst aspiration (Left, 1998) and Breast cyst aspiration (Left, 09/06/2020) are also affecting patient's functional outcome.   REHAB POTENTIAL: Good  CLINICAL DECISION MAKING: Evolving/moderate complexity  EVALUATION COMPLEXITY: Moderate   GOALS: Goals reviewed with patient? No  SHORT TERM GOALS: Target date: 10/22/2023  Patient will be independent with initial home exercise program for self-management of symptoms. Baseline: Initial HEP to be provided at visit 2 as appropriate (10/08/23); No questions, demonstrates independence with HEP. (11/10/2023) Goal status: MET   LONG TERM GOALS: Target date: 12/31/2023  Patient will be independent with a long-term home exercise program for self-management of symptoms.  Baseline: Initial HEP to be provided at visit 2 as appropriate (10/08/23); Goal status: In progress  2.  Patient will demonstrate improved Lower Extremity Functional Scale (LEFS) to equal or greater than 77/80 to demonstrate improvement in overall condition and self-reported functional ability.  Baseline: 68/80  (10/08/23); 65/80 (11/10/2023) Goal status: In progress  3.  Patient will demonstrate the ability to perform her hardest fitness activity (defined by patient) without increased symptoms to improve her ability to complete fitness activities.  Baseline: to be measured at visit 2 as appropriate (10/08/23); Goal status: In progress  4.  Patient will demonstrate  full lumbar AROM without increased pain or symptoms to improve her ability to perform yoga and gardening.  Baseline: limited and painful left lumbar flexion and painful extension (10/08/23); Goal status: In progress  5.  Patient will demonstrate improvement in Patient Specific Functional Scale (PSFS) of equal or greater than 8/10 points to reflect clinically significant improvement in patient's most valued functional activities. Baseline: to be measured at visit 2 as appropriate (10/08/23); Goal status: In progress  6.  Patient will report NPRS equal or less than 3/10 during functional activities during the last 2 weeks to improve their abilitly to complete community, work and/or recreational activities with less limitation. Baseline: 9/10 (10/08/23); 4/10 at most for the past 2 weeks doing her functional activities (11/10/2023) Goal status: In progress   PLAN:  PT FREQUENCY: 2x/week  PT DURATION: 8-12 weeks  PLANNED INTERVENTIONS: 97164- PT Re-evaluation, 97750- Physical Performance Testing, 97110-Therapeutic exercises, 97530- Therapeutic activity, [id number]- Neuromuscular re-education, 97535- Self Care, [id number]- Manual therapy, G0283- Electrical stimulation (unattended), 20560 (1-2 muscles), 20561 (3+ muscles)- Dry Needling, Patient/Family education, Cryotherapy, and Moist heat.  PLAN FOR NEXT SESSION: update HEP as appropriate, education on mechanical stressors and modifications of activities to avoid them, recover motor control and awareness of modifiers to mechanical sensitivities, retrain motor patterns, regain ROM, improve strength  and resilience needed for  performing desired functional performance with appropriate ROM, strength, power, and endurance. Manual therapy as needed.    Emil Glassman PT, DPT  11/10/23, 12:39 PM  [facility] [address] [place], [place] [address] P: [phone] I F: [phone]

## 2023-11-12 ENCOUNTER — Ambulatory Visit

## 2023-11-12 ENCOUNTER — Encounter: Admitting: Physical Therapy

## 2023-11-12 DIAGNOSIS — M5416 Radiculopathy, lumbar region: Secondary | ICD-10-CM | POA: Diagnosis not present

## 2023-11-12 DIAGNOSIS — M25552 Pain in left hip: Secondary | ICD-10-CM

## 2023-11-12 DIAGNOSIS — M6281 Muscle weakness (generalized): Secondary | ICD-10-CM

## 2023-11-12 NOTE — Therapy (Signed)
 OUTPATIENT PHYSICAL THERAPY TREATMENT   Patient Name: Adrienne Phillips MRN: [id number] DOB:01/15/1971, 53 y.o., female Today's Date: 11/12/2023  END OF SESSION:  PT End of Session - 11/12/23 0819     Visit Number 9    Number of Visits 17    Date for Recertification  12/31/23    Authorization Type [employer] reporting period from 10/08/2023    Progress Note Due on Visit 10    PT Start Time 0819    PT Stop Time 0858    PT Time Calculation (min) 39 min    Activity Tolerance Patient tolerated treatment well    Behavior During Therapy [facility] for tasks assessed/performed               Past Medical History:  Diagnosis Date   Anemia    BRCA negative 02/2021   MyRisk neg except AXIN2 VUS   Family history of breast cancer    Hypoglycemia    Increased risk of breast cancer 02/2021   IBIS=35.4%/riskscore=26.4%   Iron deficiency    Migraine    Ovarian cyst    Past Surgical History:  Procedure Laterality Date   BREAST CYST ASPIRATION Left 1998   BREAST CYST ASPIRATION Left 09/06/2020   1:00 FNA   Patient Active Problem List   Diagnosis Date Noted   Thyroid  fullness 09/17/2023   Hand pain 03/23/2023   Hip pain 03/23/2023   Breast cancer screening, high risk patient 03/19/2023   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   Alcohol use 05/29/2022   Joint pain 05/14/2022   History of genetic counseling 07/03/2021   Dysphagia 05/11/2021   Family history of breast cancer 02/12/2021   Family history of aneurysm 10/28/2020   Left sided abdominal pain 02/15/2020   Weight gain 02/15/2020   Vaccine counseling 09/18/2019   Hearing loss 09/18/2019   Headache 07/07/2019   Breast tenderness 07/03/2019   Irregular periods 09/13/2018   Facial droop 04/10/2017   Numbness and tingling of right arm and leg 04/10/2017   Stress 07/18/2016   History of abnormal cervical Pap smear 01/06/2016   Chest pain 06/30/2015   Left arm numbness 06/27/2015   Neck pain  06/27/2015   Bad odor of urine 11/13/2014   Lower back pain 11/08/2014   History of colonic polyps 03/19/2014   Health care maintenance 03/19/2014   Congestion of nasal sinus 03/19/2014   Barrett's esophagus 03/20/2013   Anemia 02/15/2012   Leukopenia 02/15/2012    PCP: Glendia Shad, MD  REFERRING PROVIDER: Glendia Shad, MD  REFERRING DIAG: hip pain, unspecified laterality  Rationale for Evaluation and Treatment: Rehabilitation  THERAPY DIAG:  Radiculopathy, lumbar region  Pain in left hip  Muscle weakness (generalized)  ONSET DATE: at least 1.5 years since PT evaluation  SUBJECTIVE:  PERTINENT HISTORY:  Patient is a 53 y.o. female who presents to outpatient physical therapy with a referral for medical diagnosis hip pain, unspecified laterality. This patient's chief complaints consist of pain in the left posterior glute radiating down leg to bottom of foot with intermittent tingling and weakness, leading to the following functional deficits: difficulty with usual activities such as sleep, prolonged standing, sitting, working, zumba, gardening, and physical fitness/activities such as running, jogging, yoga, strength training. Relevant past medical history and comorbidities include the following: she has Anemia; Leukopenia; Barrett's esophagus; History of colonic polyps; Health care maintenance; Congestion of nasal sinus; Lower back pain; Bad odor of urine; Left arm numbness; Neck pain; Chest pain; History of abnormal cervical Pap smear; Stress; Facial droop; Numbness and tingling of right arm and leg; Irregular periods; Breast tenderness; Headache; Vaccine counseling; Hearing loss; Left sided abdominal pain; Weight gain; Family history of aneurysm; Family history of breast cancer; Dysphagia; History  of genetic counseling; Joint pain; Alcohol use; Monoclonal B-cell lymphocytosis of undetermined significance; Breast cancer screening, high risk patient; Hand pain; Hip pain; and Thyroid  fullness on their problem list. she  has a past medical history of Anemia, BRCA negative (02/2021), Family history of breast cancer, Hypoglycemia, Increased risk of breast cancer (02/2021), Iron deficiency, Migraine, and Ovarian cyst. she  has a past surgical history that includes Breast cyst aspiration (Left, 1998) and Breast cyst aspiration (Left, 09/06/2020). Patient denies hx of cancer, stroke, lung problems, heart problems, diabetes, unexplained weight loss, unexplained changes in bowel or bladder problems, unexplained stumbling or dropping things, osteoporosis, and spinal surgery. 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   Exercise history:  Work out and be active, runs, jogs, zumba, or walking, lifting weights, hiking, gardening. She just pushes herself through.   SUBJECTIVE STATEMENT: Low back and L hip are good this morning. Had a good workout. Has tightness L lateral hip but no pain. L heel is really good actually, almost gone.       Was a lot better after last session last week. L ankle was a little tight Saturday, did her stretches, then its better.   No L low back and hip pain currently.  No L heel pain currently.   Zumba is her hardest fitness activity. The side to side jumps at Zumba, pt feels unsteady with L heel pain     PAIN: NPRS: Current: 2/10 left SIJ region, left distal left thigh (she feels a knot there), and pain in the the arch of the left foot and the heel.   From initial PT evaluation on 10/08/23:  Best: 4/10, Worst: 9/10. Pain location: left posterior iliac crest, down back of left leg into the bottom of her left foot to the arch and lateral heel.  Pain description: Sometimes tingling in the the left foot. Hip is gnawing pain . Numbness/tingling: tingling in left foot.   Aggravating factors: zumba (foot will go numb, hip will aggravate her and leg won't work), prolonged standing activities, sitting.  Relieving factors: on weekends when doing things other than standing at desk, anything that is different from usual, massage, alcohol, stretching, deep blue, shifting away from the left.    PRECAUTIONS: None  PATIENT GOALS: I would love for the pain to go away or at least help my pain   OBJECTIVE  TREATMENT  11/12/2023   Therapeutic activities Performed with the intent to promote ability to perform standing tasks more comfortably.   S/L reverse clamshell   L 10x5 seconds holds for 3 sets  Single leg dead lift with contralateral UE assist PRN  L 10x3  With B UE assist  Standing B eccentric heel lowering from first stair step 10x  Forward walking lunges 30 ft x 2  With B UE assist  Standing B eccentric heel lowering from first stair step 10x   Half kneeling psoas stretch   L 45 seconds x 2  Jump rope 30 seconds x 3.  No L heel or L hip and low back pain reported.    Level 1 plyometrics to improve Achilles tendon strength    Side to side lateral jumps simulating Zumba   10x3   Improved technique, movement at target joints, use of target muscles after mod verbal, visual, tactile cues.   Manual therapy  Seated STM L heel area to decrease fascial restrictions     PATIENT EDUCATION:  Education details: Education about directional preference and avoidance of flexion and sciatic nerve tension. Education about activity modifications.  Person educated: Patient Education method: Explanation, Demonstration, Tactile cues, Verbal cues, and Handouts Education comprehension: verbalized understanding, returned demonstration, and needs further education  HOME EXERCISE PROGRAM: Access Code: [email or web address] URL:  [email or web address] Date: 10/19/2023 Prepared by: Camie Cleverly  Exercises - Prone Off Center Lumbar Extension Press Up  - 1 sets - 20 reps - 1 second hold - every 2 hours frequency - Seated Correct Posture  - Half Kneeling Hip Flexor Stretch  - 1 x daily - 1 sets - 3 reps - 45 seconds hold - Quadruped Hip Hike on Foam  - 1 x daily - 3 sets - 5 reps - 5 seconds hold - 1.5 minutes time practicing per set  - Seated Piriformis Stretch  - 3 x daily - 7 x weekly - 1 sets - 3 reps - 1 minute hold - Seated Hip Internal Rotation AROM  - 3 x daily - 7 x weekly - 3 sets - 10 reps - 5 seconds hold     ASSESSMENT:  CLINICAL IMPRESSION:  Improving L hip and low back as well as L heel comfort based on subjective reports. Continued working on improving L glute strength to decrease stress to L hip and low back. Continued working on improving facial mobility L heel as well as proper loading to L achilles to promote healing, decrease pain and return to her Zumba fitness routine when appropriate. No pain reported after session.  Patient would benefit from continued management of limiting condition by skilled physical therapist to address remaining impairments and functional limitations to work towards stated goals and return to PLOF or maximal functional independence.      MDT classification: lumbar derangement syndrome with extension + left sidebending preference.  Mechanical sensitivities: load, Nerve tension  From initial PT evaluation on 10/08/2023:  Patient is a 53 y.o. female referred to outpatient physical therapy with a medical diagnosis of hip pain, unspecified laterality who presents with signs and symptoms consistent with lumbar radiculopathy that is likely causing her L hip pain and leg symptoms. Patient has positive traction alleviation test, nerve tension tests, and has decreased strength on the L during single leg heel raise test than the right. Her pattern of pain and symptoms  of weakness and tingling suggest lumbar radiculopathy causing her complaint. Plan to treat for lumbar radiculopathy to resolve hip pain  and test further for hip joint pain as appropriate as low back symptoms improves or if there is no improvement. Hip testing is of limited utility in the presence of overlapping radicular symptoms.   Patient presents with significant pain, paresthesia, muscle performance (strength/power/endurance), motor control, nerve tension, balance, ROM, and activity tolerance impairments that are limiting ability to complete usual activities such as sleep, prolonged standing, sitting, working, zumba, gardening, and physical fitness/activities such as running, jogging, yoga, strength training without difficulty. Patient will benefit from skilled physical therapy intervention to address current body structure impairments and activity limitations to improve function and work towards goals set in current POC in order to return to prior level of function or maximal functional improvement.    OBJECTIVE IMPAIRMENTS: decreased activity tolerance, decreased balance, decreased coordination, decreased endurance, decreased knowledge of condition, decreased mobility, decreased ROM, decreased strength, increased edema, increased muscle spasms, impaired flexibility, impaired sensation, and pain.   ACTIVITY LIMITATIONS: sitting, standing, and sleeping  PARTICIPATION LIMITATIONS: interpersonal relationship, community activity, and occupation and ability to complete usual activities such as sleep, prolonged standing, sitting, working, zumba, gardening, and physical fitness/activities such as running, jogging, yoga, strength training  PERSONAL FACTORS: Age, Behavior pattern, Past/current experiences, Profession, Time since onset of injury/illness/exacerbation, and 3+ comorbidities:  she has Anemia; Leukopenia; Barrett's esophagus; History of colonic polyps; Health care maintenance; Congestion of nasal  sinus; Lower back pain; Bad odor of urine; Left arm numbness; Neck pain; Chest pain; History of abnormal cervical Pap smear; Stress; Facial droop; Numbness and tingling of right arm and leg; Irregular periods; Breast tenderness; Headache; Vaccine counseling; Hearing loss; Left sided abdominal pain; Weight gain; Family history of aneurysm; Family history of breast cancer; Dysphagia; History of genetic counseling; Joint pain; Alcohol use; Monoclonal B-cell lymphocytosis of undetermined significance; Breast cancer screening, high risk patient; Hand pain; Hip pain; and Thyroid  fullness on their problem list. she  has a past medical history of Anemia, BRCA negative (02/2021), Family history of breast cancer, Hypoglycemia, Increased risk of breast cancer (02/2021), Iron deficiency, Migraine, and Ovarian cyst. she  has a past surgical history that includes Breast cyst aspiration (Left, 1998) and Breast cyst aspiration (Left, 09/06/2020) are also affecting patient's functional outcome.   REHAB POTENTIAL: Good  CLINICAL DECISION MAKING: Evolving/moderate complexity  EVALUATION COMPLEXITY: Moderate   GOALS: Goals reviewed with patient? No  SHORT TERM GOALS: Target date: 10/22/2023  Patient will be independent with initial home exercise program for self-management of symptoms. Baseline: Initial HEP to be provided at visit 2 as appropriate (10/08/23); No questions, demonstrates independence with HEP. (11/10/2023) Goal status: MET   LONG TERM GOALS: Target date: 12/31/2023  Patient will be independent with a long-term home exercise program for self-management of symptoms.  Baseline: Initial HEP to be provided at visit 2 as appropriate (10/08/23); Goal status: In progress  2.  Patient will demonstrate improved Lower Extremity Functional Scale (LEFS) to equal or greater than 77/80 to demonstrate improvement in overall condition and self-reported functional ability.  Baseline: 68/80 (10/08/23); 65/80  (11/10/2023), pt states that she answered the questions based on L heel as well instead of the L hip and low back (original survey answered based on L hip and low back only). 77/80, answered based on L hip and low back (11/12/2023) Goal status: MET  3.  Patient will demonstrate the ability to perform her hardest fitness activity (defined by patient) without increased symptoms to improve her ability to complete fitness activities.  Baseline: to be measured at  visit 2 as appropriate (10/08/23); Goal status: In progress  4.  Patient will demonstrate full lumbar AROM without increased pain or symptoms to improve her ability to perform yoga and gardening.  Baseline: limited and painful left lumbar flexion and painful extension (10/08/23); Goal status: In progress  5.  Patient will demonstrate improvement in Patient Specific Functional Scale (PSFS) of equal or greater than 8/10 points to reflect clinically significant improvement in patient's most valued functional activities. Baseline: to be measured at visit 2 as appropriate (10/08/23); Goal status: In progress  6.  Patient will report NPRS equal or less than 3/10 during functional activities during the last 2 weeks to improve their abilitly to complete community, work and/or recreational activities with less limitation. Baseline: 9/10 (10/08/23); 4/10 at most for the past 2 weeks doing her functional activities (11/10/2023) Goal status: In progress   PLAN:  PT FREQUENCY: 2x/week  PT DURATION: 8-12 weeks  PLANNED INTERVENTIONS: 97164- PT Re-evaluation, 97750- Physical Performance Testing, 97110-Therapeutic exercises, 97530- Therapeutic activity, [id number]- Neuromuscular re-education, 97535- Self Care, [id number]- Manual therapy, [id number]- Electrical stimulation (unattended), 20560 (1-2 muscles), 20561 (3+ muscles)- Dry Needling, Patient/Family education, Cryotherapy, and Moist heat.  PLAN FOR NEXT SESSION: update HEP as appropriate, education on  mechanical stressors and modifications of activities to avoid them, recover motor control and awareness of modifiers to mechanical sensitivities, retrain motor patterns, regain ROM, improve strength and resilience needed for  performing desired functional performance with appropriate ROM, strength, power, and endurance. Manual therapy as needed.    Emil Glassman PT, DPT  11/12/23, 9:10 AM  [facility] [address] [place], [place] [address] P: [phone] I F: [phone]

## 2023-11-17 ENCOUNTER — Encounter: Payer: Self-pay | Admitting: Physical Therapy

## 2023-11-17 ENCOUNTER — Ambulatory Visit: Admitting: Physical Therapy

## 2023-11-17 DIAGNOSIS — M5416 Radiculopathy, lumbar region: Secondary | ICD-10-CM

## 2023-11-17 DIAGNOSIS — M6281 Muscle weakness (generalized): Secondary | ICD-10-CM

## 2023-11-17 DIAGNOSIS — M25552 Pain in left hip: Secondary | ICD-10-CM

## 2023-11-17 NOTE — Therapy (Signed)
 OUTPATIENT PHYSICAL THERAPY TREATMENT / DISCHARGE SUMMARY Dates of reporting from 10/08/2023 to 11/17/2023   Patient Name: Adrienne Phillips MRN: [id number] DOB:[birth year], 53 y.o., female Today's Date: 11/17/2023  END OF SESSION:  PT End of Session - 11/17/23 1913     Visit Number 10    Number of Visits 17    Date for Recertification  12/31/23    Authorization Type [employer] reporting period from 10/08/2023    Progress Note Due on Visit 10    PT Start Time 1820    PT Stop Time 1856    PT Time Calculation (min) 36 min    Activity Tolerance Patient tolerated treatment well    Behavior During Therapy [facility] for tasks assessed/performed                Past Medical History:  Diagnosis Date   Anemia    BRCA negative 02/2021   MyRisk neg except AXIN2 VUS   Family history of breast cancer    Hypoglycemia    Increased risk of breast cancer 02/2021   IBIS=35.4%/riskscore=26.4%   Iron deficiency    Migraine    Ovarian cyst    Past Surgical History:  Procedure Laterality Date   BREAST CYST ASPIRATION Left 1998   BREAST CYST ASPIRATION Left 09/06/2020   1:00 FNA   Patient Active Problem List   Diagnosis Date Noted   Thyroid  fullness 09/17/2023   Hand pain 03/23/2023   Hip pain 03/23/2023   Breast cancer screening, high risk patient 03/19/2023   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   Alcohol use 05/29/2022   Joint pain 05/14/2022   History of genetic counseling 07/03/2021   Dysphagia 05/11/2021   Family history of breast cancer 02/12/2021   Family history of aneurysm 10/28/2020   Left sided abdominal pain 02/15/2020   Weight gain 02/15/2020   Vaccine counseling 09/18/2019   Hearing loss 09/18/2019   Headache 07/07/2019   Breast tenderness 07/03/2019   Irregular periods 09/13/2018   Facial droop 04/10/2017   Numbness and tingling of right arm and leg 04/10/2017   Stress 07/18/2016   History of abnormal cervical Pap smear  01/06/2016   Chest pain 06/30/2015   Left arm numbness 06/27/2015   Neck pain 06/27/2015   Bad odor of urine 11/13/2014   Lower back pain 11/08/2014   History of colonic polyps 03/19/2014   Health care maintenance 03/19/2014   Congestion of nasal sinus 03/19/2014   Barrett's esophagus 03/20/2013   Anemia 02/15/2012   Leukopenia 02/15/2012    PCP: Glendia Shad, MD  REFERRING PROVIDER: Glendia Shad, MD  REFERRING DIAG: hip pain, unspecified laterality  Rationale for Evaluation and Treatment: Rehabilitation  THERAPY DIAG:  Radiculopathy, lumbar region  Pain in left hip  Muscle weakness (generalized)  ONSET DATE: at least 1.5 years since PT evaluation  SUBJECTIVE:  PERTINENT HISTORY:  Patient is a 53 y.o. female who presents to outpatient physical therapy with a referral for medical diagnosis hip pain, unspecified laterality. This patient's chief complaints consist of pain in the left posterior glute radiating down leg to bottom of foot with intermittent tingling and weakness, leading to the following functional deficits: difficulty with usual activities such as sleep, prolonged standing, sitting, working, zumba, gardening, and physical fitness/activities such as running, jogging, yoga, strength training. Relevant past medical history and comorbidities include the following: she has Anemia; Leukopenia; Barrett's esophagus; History of colonic polyps; Health care maintenance; Congestion of nasal sinus; Lower back pain; Bad odor of urine; Left arm numbness; Neck pain; Chest pain; History of abnormal cervical Pap smear; Stress; Facial droop; Numbness and tingling of right arm and leg; Irregular periods; Breast tenderness; Headache; Vaccine counseling; Hearing loss; Left sided abdominal pain; Weight  gain; Family history of aneurysm; Family history of breast cancer; Dysphagia; History of genetic counseling; Joint pain; Alcohol use; Monoclonal B-cell lymphocytosis of undetermined significance; Breast cancer screening, high risk patient; Hand pain; Hip pain; and Thyroid  fullness on their problem list. she  has a past medical history of Anemia, BRCA negative (02/2021), Family history of breast cancer, Hypoglycemia, Increased risk of breast cancer (02/2021), Iron deficiency, Migraine, and Ovarian cyst. she  has a past surgical history that includes Breast cyst aspiration (Left, 1998) and Breast cyst aspiration (Left, 09/06/2020). Patient denies hx of cancer, stroke, lung problems, heart problems, diabetes, unexplained weight loss, unexplained changes in bowel or bladder problems, unexplained stumbling or dropping things, osteoporosis, and spinal surgery. 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   Exercise history:  Work out and be active, runs, jogs, zumba, or walking, lifting weights, hiking, gardening. She just pushes herself through.   SUBJECTIVE STATEMENT: Patient states things are going well. She states the last time she had concordant pain was Saturday after working in the yard for 2 hours. She had pain all over her body but her familiar pain too. Has not been back to zumba, but she did do a piliates class on Saturday which went well. She states she got new shoes and inserts as recommended by PT and they are helping some.   PAIN: NPRS: Current: 0/10 left SIJ region  From initial PT evaluation on 10/08/23:  Best: 4/10, Worst: 9/10. Pain location: left posterior iliac crest, down back of left leg into the bottom of her left foot to the arch and lateral heel.  Pain description: Sometimes tingling in the the left foot. Hip is gnawing pain . Numbness/tingling: tingling in left foot.  Aggravating factors: zumba (foot will go numb, hip will aggravate her and leg won't work), prolonged standing  activities, sitting.  Relieving factors: on weekends when doing things other than standing at desk, anything that is different from usual, massage, alcohol, stretching, deep blue, shifting away from the left.    PRECAUTIONS: None  PATIENT GOALS: I would love for the pain to go away or at least help my pain   OBJECTIVE  SELF-REPORTED FUNCTION Patient Specific Functional Scale (PSFS)  Running: 5/10 Zumba: 7/10 Stepper: 4/10 Average: 5.3/10   AROM Lumbar AROM: Flexion: fingers to distal tibias, feels fantastic (slight right rib hump) Extension: 100% feels good Rotation:  R = 100%  L = 100% lingering pinch at left iliac crest region Side Flexion: R = 100% feels good L = 75% pinching in left iliac crest region   FUNCTIONAL TESTING Side to side lateral jumps  simulating Zumba each side A few reps each side: slight left heel pain   TREATMENT                                                                                                                          Therapeutic exercise: therapeutic exercises that incorporate ONE parameter at one or more areas of the body to centralize symptoms, develop strength and endurance, range of motion, and flexibility required for successful completion of functional activities.  Lumbar AROM testing to assess progress (See above)  Therapeutic activities Performed with the intent to promote ability to perform standing tasks more comfortably.   S/L reverse clamshell   L 1x10 with 3 seconds Added YTB loop around ankles (provided for home use)  Single leg dead lift with contralateral UE assist PRN  2x10 L side 1x10 R side Progressed to less UE support   With B UE assist  Standing B eccentric heel lowering from first stair step  1x10 with 5 second lower  Forward walking lunges  2x30 ft  With B UE assist  Standing B eccentric heel lowering from first stair step  1x10 with 5 second lower  Half kneeling psoas stretch   2x 45  seconds each side   Improved technique, movement at target joints, use of target muscles after mod verbal, visual, tactile cues.    PATIENT EDUCATION:  Education details: Education about directional preference and avoidance of flexion and sciatic nerve tension. Education about activity modifications.  Person educated: Patient Education method: Explanation, Demonstration, Tactile cues, Verbal cues, and Handouts Education comprehension: verbalized understanding, returned demonstration, and needs further education  HOME EXERCISE PROGRAM: Access Code: [email or web address] URL: [email or web address] Date: 10/19/2023 Prepared by: Camie Cleverly  Exercises - Prone Off Center Lumbar Extension Press Up  - 1 sets - 20 reps - 1 second hold - every 2 hours frequency - Seated Correct Posture  - Half Kneeling Hip Flexor Stretch  - 1 x daily - 1 sets - 3 reps - 45 seconds hold - Quadruped Hip Hike on Foam  - 1 x daily - 3 sets - 5 reps - 5 seconds hold - 1.5 minutes time practicing per set  - Seated Piriformis Stretch  - 3 x daily - 7 x weekly - 1 sets - 3 reps - 1 minute hold - Seated Hip Internal Rotation AROM  - 3 x daily - 7 x weekly - 3 sets - 10 reps - 5 seconds hold     ASSESSMENT:  CLINICAL IMPRESSION: Patient has attended 10 physical therapy sessions since starting current episode of care. She has met her short term goal and 3/6 long term goals. She reports significant improvement with pain down from 9/10 to 3/10 with functional activities. She reports her L LE continues to go numb and drag with zumba class but she reports she is determined to continue with the aggrevating steps in zumba anyway. She feels ready to discharge today and is leaving town for  1 months. She is now discharged from PT due to improvement in symptoms and per her preference.    OBJECTIVE IMPAIRMENTS: decreased activity tolerance, decreased balance, decreased coordination, decreased endurance, decreased knowledge of  condition, decreased mobility, decreased ROM, decreased strength, increased edema, increased muscle spasms, impaired flexibility, impaired sensation, and pain.   ACTIVITY LIMITATIONS: sitting, standing, and sleeping  PARTICIPATION LIMITATIONS: interpersonal relationship, community activity, and occupation and ability to complete usual activities such as sleep, prolonged standing, sitting, working, zumba, gardening, and physical fitness/activities such as running, jogging, yoga, strength training  PERSONAL FACTORS: Age, Behavior pattern, Past/current experiences, Profession, Time since onset of injury/illness/exacerbation, and 3+ comorbidities:  she has Anemia; Leukopenia; Barrett's esophagus; History of colonic polyps; Health care maintenance; Congestion of nasal sinus; Lower back pain; Bad odor of urine; Left arm numbness; Neck pain; Chest pain; History of abnormal cervical Pap smear; Stress; Facial droop; Numbness and tingling of right arm and leg; Irregular periods; Breast tenderness; Headache; Vaccine counseling; Hearing loss; Left sided abdominal pain; Weight gain; Family history of aneurysm; Family history of breast cancer; Dysphagia; History of genetic counseling; Joint pain; Alcohol use; Monoclonal B-cell lymphocytosis of undetermined significance; Breast cancer screening, high risk patient; Hand pain; Hip pain; and Thyroid  fullness on their problem list. she  has a past medical history of Anemia, BRCA negative (02/2021), Family history of breast cancer, Hypoglycemia, Increased risk of breast cancer (02/2021), Iron deficiency, Migraine, and Ovarian cyst. she  has a past surgical history that includes Breast cyst aspiration (Left, 1998) and Breast cyst aspiration (Left, 09/06/2020) are also affecting patient's functional outcome.   REHAB POTENTIAL: Good  CLINICAL DECISION MAKING: Evolving/moderate complexity  EVALUATION COMPLEXITY: Moderate   GOALS: Goals reviewed with patient? No  SHORT  TERM GOALS: Target date: 10/22/2023  Patient will be independent with initial home exercise program for self-management of symptoms. Baseline: Initial HEP to be provided at visit 2 as appropriate (10/08/23); No questions, demonstrates independence with HEP. (11/10/2023) Goal status: MET   LONG TERM GOALS: Target date: 12/31/2023  Patient will be independent with a long-term home exercise program for self-management of symptoms.  Baseline: Initial HEP to be provided at visit 2 as appropriate (10/08/23); completing regularly and confident with continuing (11/17/2023);  Goal status: MET  2.  Patient will demonstrate improved Lower Extremity Functional Scale (LEFS) to equal or greater than 77/80 to demonstrate improvement in overall condition and self-reported functional ability.  Baseline: 68/80 (10/08/23); 65/80 (11/10/2023), pt states that she answered the questions based on L heel as well instead of the L hip and low back (original survey answered based on L hip and low back only). 77/80, answered based on L hip and low back (11/12/2023) Goal status: MET  3.  Patient will demonstrate the ability to perform her hardest fitness activity (defined by patient) without increased symptoms to improve her ability to complete fitness activities.  Baseline: to be measured at visit 2 as appropriate (10/08/23); pt reports it is jumping side to side in zumba and her leg will go numb and start dragging (11/17/23);  Goal status: not met  4.  Patient will demonstrate full lumbar AROM without increased pain or symptoms to improve her ability to perform yoga and gardening.  Baseline: limited and painful left lumbar sidebending and rotation (10/08/23); still painful with left lumbar sidebending and rotation (11/17/2023);  Goal status: not met  5.  Patient will demonstrate improvement in Patient Specific Functional Scale (PSFS) of equal or greater than 8/10 points to reflect  clinically significant improvement in  patient's most valued functional activities. Baseline: to be measured at visit 2 as appropriate (10/08/23); 5.3/10 (11/17/23);  Goal status: not met  6.  Patient will report NPRS equal or less than 3/10 during functional activities during the last 2 weeks to improve their abilitly to complete community, work and/or recreational activities with less limitation. Baseline: 9/10 (10/08/23); 4/10 at most for the past 2 weeks doing her functional activities (11/10/2023); 3/10 in the last 2 weeks and has pushing it (11/17/2023);  Goal status: MET   PLAN:  PT FREQUENCY: 2x/week  PT DURATION: 8-12 weeks  PLANNED INTERVENTIONS: 97164- PT Re-evaluation, 97750- Physical Performance Testing, 97110-Therapeutic exercises, 97530- Therapeutic activity, [id number]- Neuromuscular re-education, 97535- Self Care, [id number]- Manual therapy, [id number]- Electrical stimulation (unattended), 20560 (1-2 muscles), 20561 (3+ muscles)- Dry Needling, Patient/Family education, Cryotherapy, and Moist heat.  PLAN FOR NEXT SESSION: patient is now discharged to independent management with long term HEP.     Camie SAUNDERS. Juli, PT, DPT, Cert. MDT 11/17/23, 7:14 PM  [facility] [address] [place], [place] [address] P: [phone] I F: [phone]

## 2023-11-19 ENCOUNTER — Ambulatory Visit: Admitting: Physical Therapy

## 2023-11-24 ENCOUNTER — Ambulatory Visit: Attending: Internal Medicine | Admitting: Physical Therapy

## 2023-11-25 ENCOUNTER — Ambulatory Visit
Admission: RE | Admit: 2023-11-25 | Discharge: 2023-11-25 | Disposition: A | Discharge status: Home / Self Care | Source: Ambulatory Visit | Attending: Internal Medicine | Admitting: Internal Medicine

## 2023-11-25 ENCOUNTER — Encounter

## 2023-11-25 DIAGNOSIS — Z1231 Encounter for screening mammogram for malignant neoplasm of breast: Secondary | ICD-10-CM | POA: Diagnosis present

## 2023-11-26 ENCOUNTER — Encounter: Admitting: Physical Therapy

## 2023-12-01 ENCOUNTER — Encounter: Admitting: Physical Therapy

## 2023-12-03 ENCOUNTER — Encounter: Admitting: Physical Therapy

## 2023-12-08 ENCOUNTER — Encounter: Admitting: Physical Therapy

## 2023-12-10 ENCOUNTER — Encounter: Admitting: Physical Therapy

## 2023-12-14 ENCOUNTER — Encounter: Admitting: Physical Therapy

## 2023-12-16 ENCOUNTER — Encounter: Admitting: Physical Therapy

## 2023-12-29 ENCOUNTER — Other Ambulatory Visit

## 2024-01-01 ENCOUNTER — Inpatient Hospital Stay

## 2024-01-05 ENCOUNTER — Telehealth: Payer: Self-pay | Admitting: Oncology

## 2024-01-05 NOTE — Telephone Encounter (Signed)
 Pt called to confirm that her appt for Friday w/Yu was canc - asked it pt wanted to r/s and she declined - [facility]

## 2024-01-08 ENCOUNTER — Ambulatory Visit: Admitting: Oncology

## 2024-02-05 ENCOUNTER — Other Ambulatory Visit (INDEPENDENT_AMBULATORY_CARE_PROVIDER_SITE_OTHER)

## 2024-02-05 DIAGNOSIS — R899 Unspecified abnormal finding in specimens from other organs, systems and tissues: Secondary | ICD-10-CM | POA: Diagnosis not present

## 2024-02-05 LAB — CBC WITH DIFFERENTIAL/PLATELET
Basophils Absolute: 0.1 K/uL (ref 0.0–0.1)
Basophils Relative: 1.8 % (ref 0.0–3.0)
Eosinophils Absolute: 0.1 K/uL (ref 0.0–0.7)
Eosinophils Relative: 2 % (ref 0.0–5.0)
HCT: 37.8 % (ref 36.0–46.0)
Hemoglobin: 12.9 g/dL (ref 12.0–15.0)
Lymphocytes Relative: 24.5 % (ref 12.0–46.0)
Lymphs Abs: 0.9 K/uL (ref 0.7–4.0)
MCHC: 34.1 g/dL (ref 30.0–36.0)
MCV: 87.6 fl (ref 78.0–100.0)
Monocytes Absolute: 0.4 K/uL (ref 0.1–1.0)
Monocytes Relative: 9.9 % (ref 3.0–12.0)
Neutro Abs: 2.3 K/uL (ref 1.4–7.7)
Neutrophils Relative %: 61.8 % (ref 43.0–77.0)
Platelets: 260 K/uL (ref 150.0–400.0)
RBC: 4.32 Mil/uL (ref 3.87–5.11)
RDW: 14.2 % (ref 11.5–15.5)
WBC: 3.8 K/uL — ABNORMAL LOW (ref 4.0–10.5)

## 2024-02-06 ENCOUNTER — Ambulatory Visit: Payer: Self-pay | Admitting: Internal Medicine

## 2024-03-21 ENCOUNTER — Ambulatory Visit: Admitting: Internal Medicine
